# Patient Record
Sex: Female | Born: 1950
Health system: Southern US, Community
[De-identification: ages and names within clinical notes are randomized; demographics above are authoritative.]

## PROBLEM LIST (undated history)

## (undated) DIAGNOSIS — F32A Depression, unspecified: Secondary | ICD-10-CM

## (undated) DIAGNOSIS — F329 Major depressive disorder, single episode, unspecified: Secondary | ICD-10-CM

## (undated) DIAGNOSIS — R112 Nausea with vomiting, unspecified: Secondary | ICD-10-CM

## (undated) DIAGNOSIS — M2550 Pain in unspecified joint: Secondary | ICD-10-CM

## (undated) DIAGNOSIS — Z8489 Family history of other specified conditions: Secondary | ICD-10-CM

## (undated) DIAGNOSIS — G43909 Migraine, unspecified, not intractable, without status migrainosus: Secondary | ICD-10-CM

## (undated) DIAGNOSIS — D472 Monoclonal gammopathy: Secondary | ICD-10-CM

## (undated) DIAGNOSIS — D490 Neoplasm of unspecified behavior of digestive system: Secondary | ICD-10-CM

## (undated) DIAGNOSIS — R7303 Prediabetes: Secondary | ICD-10-CM

## (undated) DIAGNOSIS — A630 Anogenital (venereal) warts: Secondary | ICD-10-CM

## (undated) DIAGNOSIS — F419 Anxiety disorder, unspecified: Secondary | ICD-10-CM

## (undated) DIAGNOSIS — H6990 Unspecified Eustachian tube disorder, unspecified ear: Secondary | ICD-10-CM

## (undated) DIAGNOSIS — M1990 Unspecified osteoarthritis, unspecified site: Secondary | ICD-10-CM

## (undated) DIAGNOSIS — K219 Gastro-esophageal reflux disease without esophagitis: Secondary | ICD-10-CM

## (undated) DIAGNOSIS — R569 Unspecified convulsions: Secondary | ICD-10-CM

## (undated) DIAGNOSIS — Z9889 Other specified postprocedural states: Secondary | ICD-10-CM

## (undated) DIAGNOSIS — H6980 Other specified disorders of Eustachian tube, unspecified ear: Secondary | ICD-10-CM

## (undated) DIAGNOSIS — E785 Hyperlipidemia, unspecified: Secondary | ICD-10-CM

## (undated) DIAGNOSIS — H8109 Meniere's disease, unspecified ear: Secondary | ICD-10-CM

## (undated) DIAGNOSIS — G2581 Restless legs syndrome: Secondary | ICD-10-CM

## (undated) DIAGNOSIS — T7840XA Allergy, unspecified, initial encounter: Secondary | ICD-10-CM

## (undated) HISTORY — DX: Anogenital (venereal) warts: A63.0

## (undated) HISTORY — DX: Neoplasm of unspecified behavior of digestive system: D49.0

## (undated) HISTORY — PX: GANGLION CYST EXCISION: SHX1691

## (undated) HISTORY — DX: Gastro-esophageal reflux disease without esophagitis: K21.9

## (undated) HISTORY — DX: Migraine, unspecified, not intractable, without status migrainosus: G43.909

## (undated) HISTORY — DX: Unspecified convulsions: R56.9

## (undated) HISTORY — DX: Allergy, unspecified, initial encounter: T78.40XA

## (undated) HISTORY — DX: Depression, unspecified: F32.A

## (undated) HISTORY — DX: Major depressive disorder, single episode, unspecified: F32.9

## (undated) HISTORY — PX: OTHER SURGICAL HISTORY: SHX169

## (undated) HISTORY — PX: FINGER SURGERY: SHX640

## (undated) HISTORY — PX: ABDOMINAL HYSTERECTOMY: SHX81

## (undated) HISTORY — DX: Unspecified osteoarthritis, unspecified site: M19.90

---
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
## 1984-09-05 HISTORY — PX: PAROTID GLAND TUMOR EXCISION: SHX5221

## 2004-04-05 LAB — HM COLONOSCOPY: HM COLON: NORMAL

## 2004-07-08 ENCOUNTER — Ambulatory Visit: Payer: Self-pay | Admitting: Unknown Physician Specialty

## 2004-07-23 ENCOUNTER — Encounter: Payer: Self-pay | Admitting: Unknown Physician Specialty

## 2004-08-05 ENCOUNTER — Encounter: Payer: Self-pay | Admitting: Unknown Physician Specialty

## 2004-12-02 ENCOUNTER — Ambulatory Visit: Payer: Self-pay | Admitting: Unknown Physician Specialty

## 2004-12-07 ENCOUNTER — Ambulatory Visit: Payer: Self-pay | Admitting: Unknown Physician Specialty

## 2005-06-13 ENCOUNTER — Ambulatory Visit: Payer: Self-pay | Admitting: Unknown Physician Specialty

## 2005-07-14 ENCOUNTER — Ambulatory Visit: Payer: Self-pay | Admitting: Unknown Physician Specialty

## 2006-08-10 ENCOUNTER — Ambulatory Visit: Payer: Self-pay | Admitting: Unknown Physician Specialty

## 2006-09-05 HISTORY — PX: CHOLECYSTECTOMY: SHX55

## 2007-05-02 ENCOUNTER — Ambulatory Visit: Payer: Self-pay

## 2007-07-22 ENCOUNTER — Other Ambulatory Visit: Payer: Self-pay

## 2007-07-22 ENCOUNTER — Inpatient Hospital Stay: Payer: Self-pay | Admitting: Vascular Surgery

## 2008-09-05 HISTORY — PX: TUBAL LIGATION: SHX77

## 2009-09-02 ENCOUNTER — Ambulatory Visit: Payer: Self-pay | Admitting: Unknown Physician Specialty

## 2010-03-09 ENCOUNTER — Ambulatory Visit: Payer: Self-pay | Admitting: Orthopedic Surgery

## 2010-03-16 LAB — PATHOLOGY REPORT

## 2010-07-21 ENCOUNTER — Ambulatory Visit: Payer: Self-pay | Admitting: Otolaryngology

## 2011-03-07 ENCOUNTER — Ambulatory Visit: Payer: Self-pay | Admitting: Orthopedic Surgery

## 2011-03-10 ENCOUNTER — Ambulatory Visit: Payer: Self-pay | Admitting: Orthopedic Surgery

## 2011-09-06 HISTORY — PX: FINGER SURGERY: SHX640

## 2011-09-09 LAB — HM MAMMOGRAPHY: HM MAMMO: NORMAL

## 2011-10-14 ENCOUNTER — Ambulatory Visit: Payer: Self-pay | Admitting: Otolaryngology

## 2011-11-21 ENCOUNTER — Emergency Department: Payer: Self-pay | Admitting: Emergency Medicine

## 2012-01-17 ENCOUNTER — Ambulatory Visit: Payer: Self-pay | Admitting: Orthopedic Surgery

## 2012-05-23 ENCOUNTER — Ambulatory Visit: Payer: Self-pay | Admitting: Otolaryngology

## 2012-05-29 IMAGING — CR CERVICAL SPINE - COMPLETE 4+ VIEW
1 series · 7 of 7 positions shown · non-contrast
Comparison: none

REASON FOR EXAM: pain following trauma
COMMENTS:

[Series 1: w cervical spine lat · 0.14mm/px · 7 of 7 slices shown]
[im 1/7]
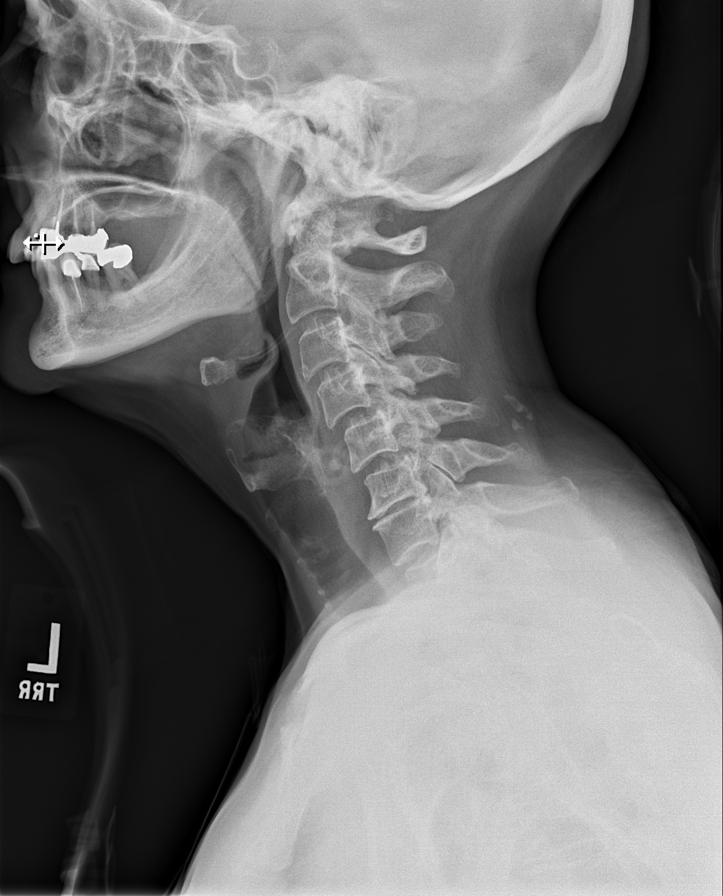
[im 2/7]
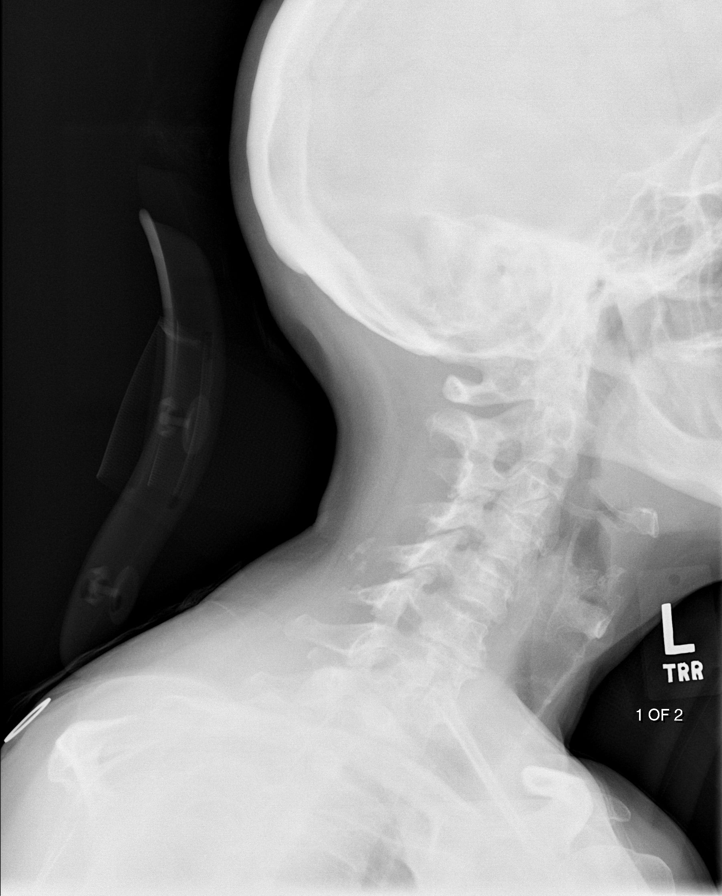
[im 3/7]
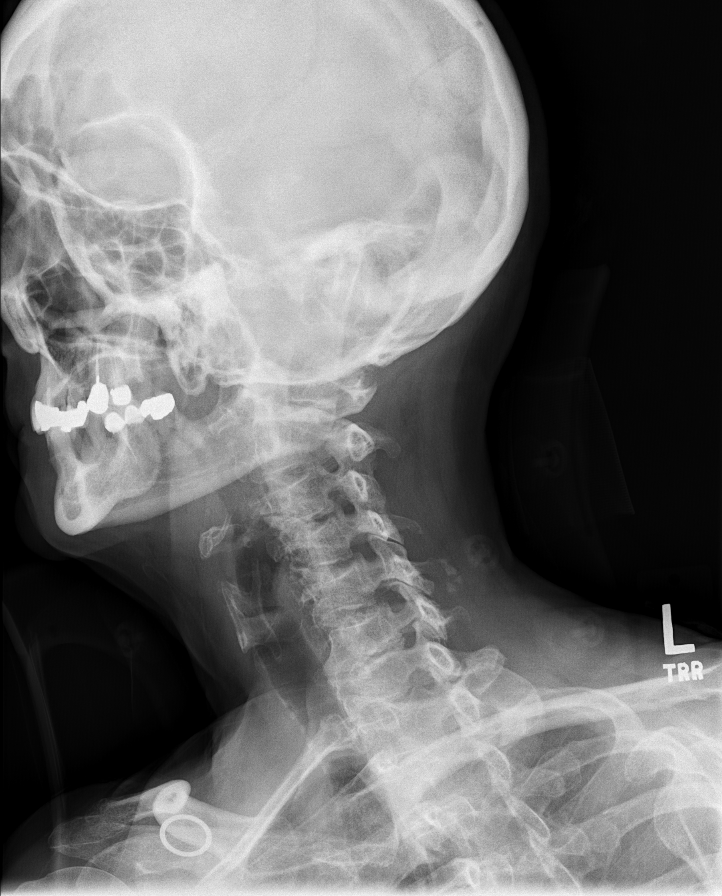
[im 4/7]
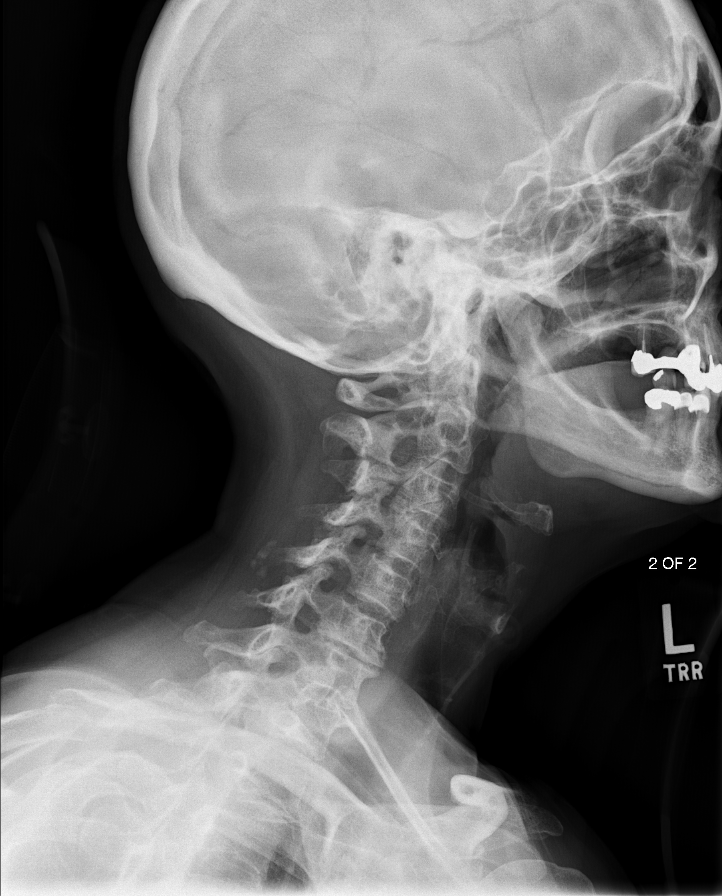
[im 5/7]
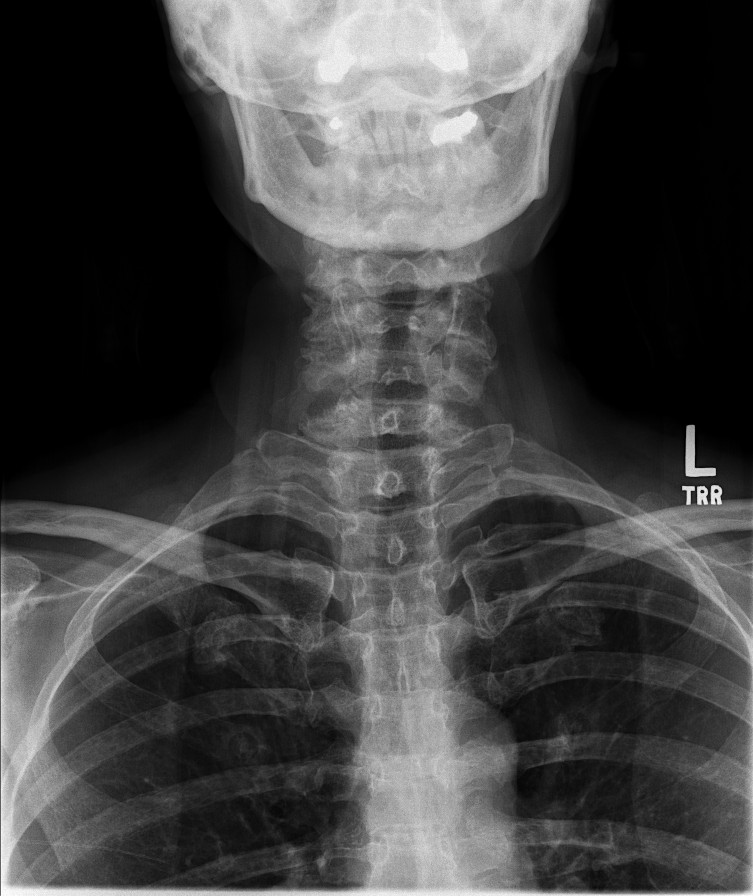
[im 6/7]
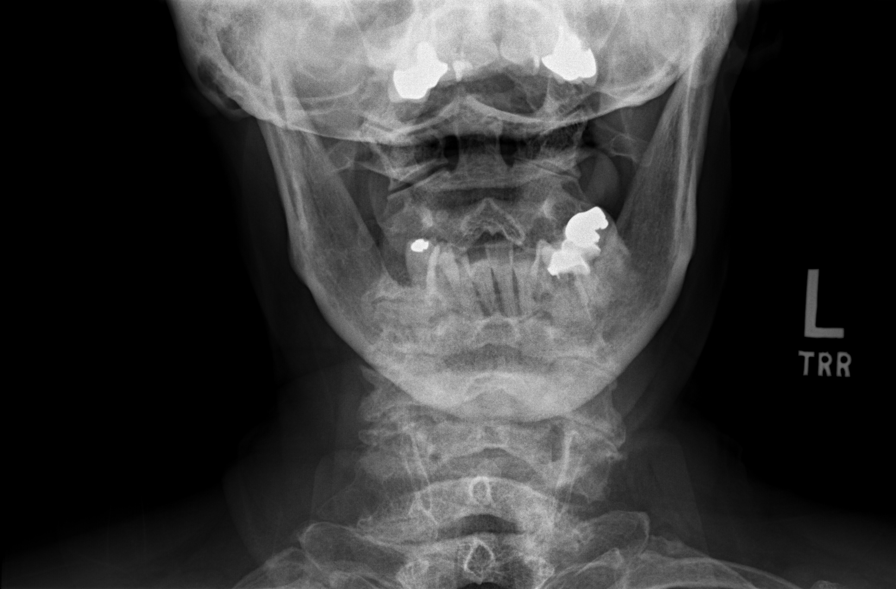
[im 7/7]
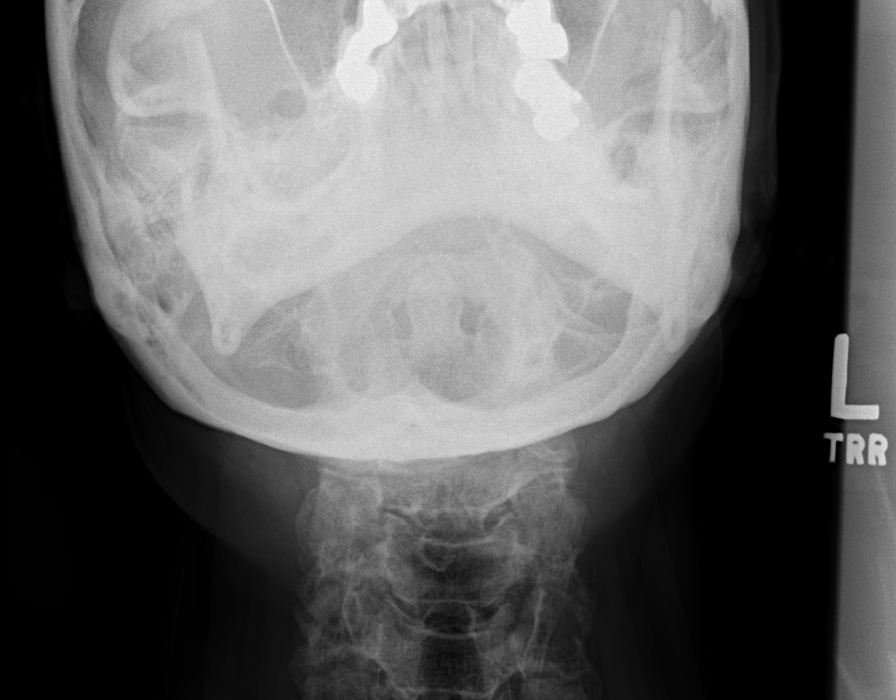

[7 of 7 positions shown; findings below may reference images not displayed]

PROCEDURE:     DXR - DXR CERVICAL SPINE COMPLETE  - November 21, 2011  [DATE]

RESULT:     Cervical spine intervertebral disc space narrowing is seen at
C6-C7. Minimal anterolisthesis is seen at the C4-C5 level. The prevertebral
soft tissues are normal. No fracture is evident. Some bony encroachment on
the facets on the right is present at the C3-C4 and C4-C5 levels with less
bony encroachment on the left at these levels. The head and neck are tilted
toward the left inferiorly. The odontoid appears intact. The atlantoaxial
alignment appears maintained.
IMPRESSION: 1.     Diffuse degenerative changes.
2.     No acute abnormality evident.
3.     Some soft tissue calcification is seen posterior to the spinous
processes predominantly at the C5 level.

## 2012-07-10 ENCOUNTER — Ambulatory Visit: Payer: Self-pay | Admitting: Orthopedic Surgery

## 2012-09-10 LAB — HM PAP SMEAR: HM PAP: NORMAL

## 2012-09-10 LAB — HM COLONOSCOPY: HM Colonoscopy: NORMAL

## 2014-12-23 NOTE — Op Note (Signed)
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   PROCEDURES:  1. Inject left thumb CMC joint. 2. Arthrodesis, right middle finger DIP joint.   SURGEON: Laurene Footman, MD  ANESTHESIA: General.    DESCRIPTION OF PROCEDURE: Patient was brought to the Operating Room and after adequate anesthesia was obtained, appropriate patient identification and timeout procedures were carried out, the skin was prepped with Betadine on the left hand and a 25-gauge needle inserted. 20 mg of Kenalog and 1 mL Marcaine were infiltrated into the [facility] joint without difficulty and a Band-Aid applied. Next, the right arm was prepped and draped in usual sterile fashion with tourniquet applied to the upper arm. The tourniquet was raised at the start of the case and a T-type incision was made over the DIP joint with the skin flaps elevated. The extensor tendon was divided and the joint exposed. Osteophytes removed with use of a small rongeur and a small saw used to debride the end of the middle phalanx. A K wire was then inserted out through the tip of the phalanx and then back into the middle phalanx. Based on this measurements were made and hand drill used. A 24 mm Acutrak screw was then inserted across the DIP joint. The head of the screw sunk well into the distal phalanx so it would not be prominent. There appeared to be very good compression at the arthrodesis site. The wound was irrigated and closed with simple interrupted 5-0 nylon. A sterile dressing with Xeroform, 4 x 4's and a Kling wrap around the finger were applied. Local anesthetic 10 mL of 0.5% Sensorcaine was infiltrated for a digital block to aid in postoperative analgesia.   COMPLICATIONS: There were no complications.    SPECIMEN: No specimens.   ESTIMATED BLOOD LOSS: Minimal.   TOURNIQUET TIME: 34 minutes at 250 mmHg.   ____________________________ Laurene Footman, MD mjm:cms D: 07/11/2012 07:19:20 ET T: 07/11/2012 09:44:13 ET JOB#: [id number]  cc: Laurene Footman, MD, <Dictator>  Laurene Footman MD ELECTRONICALLY SIGNED 07/11/2012 12:58

## 2014-12-28 NOTE — Op Note (Signed)
PATIENT NAME:  April Nguyen, April Nguyen MR#:  [id number] DATE OF BIRTH:  01/25/1951  DATE OF PROCEDURE:  01/17/2012  PREOPERATIVE DIAGNOSES:  1. Painful hardware left middle finger. 2. Left thumb CMC osteoarthritis.   POSTOPERATIVE DIAGNOSES:   1. Painful hardware left middle finger. 2. Left thumb CMC osteoarthritis.   PROCEDURES:  1. Removal screw, left middle finger. 2. Injection left thumb CMC joint.   ANESTHESIA: General.   SURGEON: Laurene Footman, MD  DESCRIPTION OF PROCEDURE: Patient was brought to the Operating Room and after adequate anesthesia was obtained, the arm was prepped and draped in the usual sterile fashion. After patient identification and timeout procedures were obtained a digital block was placed with a total of 10 mL of 0.5% Sensorcaine at the base of the third digit between the metacarpal heads. This was to sustain postoperative analgesia. A Penrose drain was used at the base of the finger and a small stab incision was made through the prior incision. C-arm was brought in and the screw head was identified easily. The AccuMed screw driver was then inserted and the screw came out without difficulty. Under fluoroscopic stress views the fusion was solid. The wound was closed with single simple interrupted 5-0 nylon skin suture. Next, going to the base of the thumb with the mini C-arm being used to help localize localization, a 25-gauge needle was inserted into the [facility] joint of the thumb and 1 mL Sensorcaine 0.5% plain and 20 mg of Kenalog were injected into this joint. Following this a bandage with Xeroform, 2 x 2's and a finger roll was applied. Patient was sent to recovery in stable condition.   ESTIMATED BLOOD LOSS: 25 mL through the tip of the finger.   COMPLICATIONS: None.   SPECIMEN: None. Hardware sent with patient.   ____________________________ Laurene Footman, MD mjm:cms D: 01/17/2012 21:04:57 ET T: 01/18/2012 07:40:59 ET JOB#: [id number]  cc: Laurene Footman, MD,  <Dictator> Laurene Footman MD ELECTRONICALLY SIGNED 01/18/2012 7:59

## 2015-04-09 ENCOUNTER — Encounter: Payer: Self-pay | Admitting: Nurse Practitioner

## 2015-04-09 ENCOUNTER — Ambulatory Visit (INDEPENDENT_AMBULATORY_CARE_PROVIDER_SITE_OTHER): Payer: 59 | Admitting: Nurse Practitioner

## 2015-04-09 VITALS — BP 112/68 | HR 92 | Temp 98.2°F | Resp 16 | Ht 65.0 in | Wt 161.8 lb

## 2015-04-09 DIAGNOSIS — F418 Other specified anxiety disorders: Secondary | ICD-10-CM

## 2015-04-09 DIAGNOSIS — H9202 Otalgia, left ear: Secondary | ICD-10-CM

## 2015-04-09 DIAGNOSIS — M7989 Other specified soft tissue disorders: Secondary | ICD-10-CM | POA: Diagnosis not present

## 2015-04-09 DIAGNOSIS — Z87898 Personal history of other specified conditions: Secondary | ICD-10-CM

## 2015-04-09 DIAGNOSIS — Z8669 Personal history of other diseases of the nervous system and sense organs: Secondary | ICD-10-CM | POA: Diagnosis not present

## 2015-04-09 DIAGNOSIS — M1990 Unspecified osteoarthritis, unspecified site: Secondary | ICD-10-CM

## 2015-04-09 DIAGNOSIS — H6983 Other specified disorders of Eustachian tube, bilateral: Secondary | ICD-10-CM

## 2015-04-09 DIAGNOSIS — Z8619 Personal history of other infectious and parasitic diseases: Secondary | ICD-10-CM

## 2015-04-09 DIAGNOSIS — H6993 Unspecified Eustachian tube disorder, bilateral: Secondary | ICD-10-CM

## 2015-04-09 DIAGNOSIS — K219 Gastro-esophageal reflux disease without esophagitis: Secondary | ICD-10-CM

## 2015-04-09 MED ORDER — ALPRAZOLAM 1 MG PO TABS: 1.0000 mg | ORAL_TABLET | Freq: Every day | ORAL | Status: DC

## 2015-04-09 MED ORDER — BUPROPION HCL ER (XL) 150 MG PO TB24: 150.0000 mg | ORAL_TABLET | Freq: Every day | ORAL | Status: DC

## 2015-04-09 NOTE — Progress Notes (Signed)
Pre visit review using our clinic review tool, if applicable. No additional management support is needed unless otherwise documented below in the visit note. 

## 2015-04-09 NOTE — Progress Notes (Deleted)
   Subjective:    Patient ID: April Nguyen, female    DOB: 1950-10-20, 64 y.o.   MRN: [id number]  HPI    Review of Systems     Objective:   Physical Exam        Assessment & Plan:
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
## 2015-04-09 NOTE — Patient Instructions (Addendum)
Welcome to [facility]! Nice to meet you!   Call ENT for instructions about ear cleaning with tubes.   Wellbutrin 150 mg once daily in the morning (start when you take only 1 of the 10 mg Lexapro tablets at night) After 7 days you will stop the Lexapro and keep the Wellbutrin 150 mg daily.   Contact your pharmacy to find out where you can get your shingles vaccination.

## 2015-04-09 NOTE — Progress Notes (Deleted)
   Subjective:    Patient ID: April Nguyen, female    DOB: November 17, 1950, 65 y.o.   MRN: [id number]  HPI     Review of Systems     Objective:   Physical Exam        Assessment & Plan:

## 2015-04-16 ENCOUNTER — Other Ambulatory Visit: Payer: Self-pay | Admitting: *Deleted

## 2015-04-16 DIAGNOSIS — Z8619 Personal history of other infectious and parasitic diseases: Secondary | ICD-10-CM | POA: Insufficient documentation

## 2015-04-16 DIAGNOSIS — M7989 Other specified soft tissue disorders: Secondary | ICD-10-CM | POA: Insufficient documentation

## 2015-04-16 DIAGNOSIS — H6980 Other specified disorders of Eustachian tube, unspecified ear: Secondary | ICD-10-CM | POA: Insufficient documentation

## 2015-04-16 DIAGNOSIS — M1990 Unspecified osteoarthritis, unspecified site: Secondary | ICD-10-CM | POA: Insufficient documentation

## 2015-04-16 DIAGNOSIS — F418 Other specified anxiety disorders: Secondary | ICD-10-CM | POA: Insufficient documentation

## 2015-04-16 DIAGNOSIS — H9202 Otalgia, left ear: Secondary | ICD-10-CM | POA: Insufficient documentation

## 2015-04-16 DIAGNOSIS — K219 Gastro-esophageal reflux disease without esophagitis: Secondary | ICD-10-CM | POA: Insufficient documentation

## 2015-04-16 DIAGNOSIS — Z87898 Personal history of other specified conditions: Secondary | ICD-10-CM | POA: Insufficient documentation

## 2015-04-16 HISTORY — DX: Personal history of other infectious and parasitic diseases: Z86.19

## 2015-04-16 NOTE — Assessment & Plan Note (Signed)
Stable. Pt knows about her triggers (stress and foods) and takes Prilosec prn.

## 2015-04-16 NOTE — Assessment & Plan Note (Signed)
March this year. Resolved. Pt reports no PHN

## 2015-04-16 NOTE — Assessment & Plan Note (Signed)
No seizures in recent years. Will follow.

## 2015-04-16 NOTE — Telephone Encounter (Signed)
Okay to refill? Please advise (not on med list)

## 2015-04-16 NOTE — Assessment & Plan Note (Signed)
No significant findings on exam- Asked pt to drink lots of water, elevate feet when possible, wear compression hose (pt has). Will follow

## 2015-04-16 NOTE — Assessment & Plan Note (Signed)
Stable on Mobic currently. Pt reports bone fusion of finger in past and right knee surgery. Will follow.

## 2015-04-16 NOTE — Assessment & Plan Note (Signed)
Cerumen around the tube placed in the TM. Stable, but uncomfortable, will send to ENT for help with removal.

## 2015-04-16 NOTE — Assessment & Plan Note (Signed)
Pt has a long hx of ear concerns. She currently has a tube in the left ear and I advised pt to see ENT for removal of cerumen.

## 2015-04-16 NOTE — Assessment & Plan Note (Signed)
Pt does not like Lexapro. She would like to try something else. Will cross taper from Lexapro to Wellbutrin. Instructions on AVS and discussed w/ pt. Pt also will continue Xanax- CSC signed and [place]CC[place] checked for compliance.

## 2015-04-17 MED ORDER — AZELASTINE-FLUTICASONE 137-50 MCG/ACT NA SUSP: 1.0000 | Freq: Two times a day (BID) | NASAL | Status: DC

## 2015-05-08 ENCOUNTER — Ambulatory Visit: Payer: 59 | Admitting: Nurse Practitioner

## 2015-05-08 ENCOUNTER — Encounter: Payer: Self-pay | Admitting: *Deleted

## 2015-05-12 NOTE — Discharge Instructions (Signed)
[facility] DISCHARGE INSTRUCTIONS FOR MYRINGOTOMY AND TUBE INSERTION  [facility] Margaretha Sheffield, M.D. Roena Malady, M.D. Malon Kindle, M.D. Carloyn Manner, M.D.  Diet:   After surgery, the patient should take only liquids and foods as tolerated.  The patient may then have a regular diet after the effects of anesthesia have worn off, usually about four to six hours after surgery.  Activities:   The patient should rest until the effects of anesthesia have worn off.  After this, there are no restrictions on the normal daily activities.  Medications:   You will be given antibiotic drops to be used in the ears postoperatively.  It is recommended to use _4__ drops ___2___ times a day for __4_ days, then the drops should be saved for possible future use.  The tubes should not cause any discomfort to the patient, but if there is any question, Tylenol should be given according to the instructions for the age of the patient.  Other medications should be continued normally.  Precautions:   Should there be recurrent drainage after the tubes are placed, the drops should be used for approximately _3-4___ days.  If it does not clear, you should call the ENT office.  Earplugs:   Earplugs are only needed for those who are going to be submerged under water.  When taking a bath or shower and using a cup or showerhead to rinse hair, it is not necessary to wear earplugs.  These come in a variety of fashions, all of which can be obtained at our office.  However, if one is not able to come by the office, then silicone plugs can be found at most pharmacies.  It is not advised to stick anything in the ear that is not approved as an earplug.  Silly putty is not to be used as an earplug.  Swimming is allowed in patients after ear tubes are inserted, however, they must wear earplugs if they are going to be submerged under water.  For those children who are going to be swimming a  lot, it is recommended to use a fitted ear mold, which can be made by our audiologist.  If discharge is noticed from the ears, this most likely represents an ear infection.  We would recommend getting your eardrops and using them as indicated above.  If it does not clear, then you should call the ENT office.  For follow up, the patient should return to the ENT office three weeks postoperatively and then every six months as required by the doctor.   General Anesthesia, Care After Refer to this sheet in the next few weeks. These instructions provide you with information on caring for yourself after your procedure. Your health care provider may also give you more specific instructions. Your treatment has been planned according to current medical practices, but problems sometimes occur. Call your health care provider if you have any problems or questions after your procedure. WHAT TO EXPECT AFTER THE PROCEDURE After the procedure, it is typical to experience:  Sleepiness.  Nausea and vomiting. HOME CARE INSTRUCTIONS  For the first 24 hours after general anesthesia:  Have a responsible person with you.  Do not drive a car. If you are alone, do not take public transportation.  Do not drink alcohol.  Do not take medicine that has not been prescribed by your health care provider.  Do not sign important papers or make important decisions.  You may resume a normal  diet and activities as directed by your health care provider.  Change bandages (dressings) as directed.  If you have questions or problems that seem related to general anesthesia, call the hospital and ask for the anesthetist or anesthesiologist on call. SEEK MEDICAL CARE IF:  You have nausea and vomiting that continue the day after anesthesia.  You develop a rash. SEEK IMMEDIATE MEDICAL CARE IF:   You have difficulty breathing.  You have chest pain.  You have any allergic problems. Document Released: 11/28/2000 Document  Revised: 08/27/2013 Document Reviewed: 03/07/2013 [facility] Patient Information 2015 [place], [place]. This information is not intended to replace advice given to you by your health care provider. Make sure you discuss any questions you have with your health care provider.

## 2015-05-13 ENCOUNTER — Ambulatory Visit: Payer: 59 | Admitting: Anesthesiology

## 2015-05-13 ENCOUNTER — Ambulatory Visit: Payer: 59 | Admitting: Nurse Practitioner

## 2015-05-13 ENCOUNTER — Encounter: Payer: Self-pay | Admitting: Anesthesiology

## 2015-05-13 ENCOUNTER — Encounter
Admission: RE | Disposition: A | Discharge status: Home / Self Care | Payer: Self-pay | Source: Ambulatory Visit | Attending: Otolaryngology

## 2015-05-13 ENCOUNTER — Ambulatory Visit
Admission: RE | Admit: 2015-05-13 | Discharge: 2015-05-13 | Disposition: A | Discharge status: Home / Self Care | Payer: 59 | Source: Ambulatory Visit | Attending: Otolaryngology | Admitting: Otolaryngology

## 2015-05-13 DIAGNOSIS — F419 Anxiety disorder, unspecified: Secondary | ICD-10-CM | POA: Insufficient documentation

## 2015-05-13 DIAGNOSIS — H8103 Meniere's disease, bilateral: Secondary | ICD-10-CM | POA: Insufficient documentation

## 2015-05-13 DIAGNOSIS — K219 Gastro-esophageal reflux disease without esophagitis: Secondary | ICD-10-CM | POA: Insufficient documentation

## 2015-05-13 DIAGNOSIS — Z4582 Encounter for adjustment or removal of myringotomy device (stent) (tube): Secondary | ICD-10-CM | POA: Diagnosis not present

## 2015-05-13 DIAGNOSIS — Z888 Allergy status to other drugs, medicaments and biological substances status: Secondary | ICD-10-CM | POA: Insufficient documentation

## 2015-05-13 DIAGNOSIS — H7293 Unspecified perforation of tympanic membrane, bilateral: Secondary | ICD-10-CM | POA: Diagnosis not present

## 2015-05-13 DIAGNOSIS — H6983 Other specified disorders of Eustachian tube, bilateral: Secondary | ICD-10-CM | POA: Insufficient documentation

## 2015-05-13 DIAGNOSIS — M1990 Unspecified osteoarthritis, unspecified site: Secondary | ICD-10-CM | POA: Diagnosis not present

## 2015-05-13 DIAGNOSIS — R569 Unspecified convulsions: Secondary | ICD-10-CM | POA: Insufficient documentation

## 2015-05-13 DIAGNOSIS — H6123 Impacted cerumen, bilateral: Secondary | ICD-10-CM | POA: Diagnosis not present

## 2015-05-13 HISTORY — DX: Nausea with vomiting, unspecified: R11.2

## 2015-05-13 HISTORY — DX: Family history of other specified conditions: Z84.89

## 2015-05-13 HISTORY — DX: Meniere's disease, unspecified ear: H81.09

## 2015-05-13 HISTORY — DX: Other specified postprocedural states: Z98.890

## 2015-05-13 HISTORY — PX: MYRINGOTOMY WITH TUBE PLACEMENT: SHX5663

## 2015-05-13 SURGERY — MYRINGOTOMY WITH TUBE PLACEMENT
Anesthesia: General | Laterality: Bilateral | Wound class: Clean Contaminated

## 2015-05-13 MED ORDER — LACTATED RINGERS IV SOLN
INTRAVENOUS | Status: DC
  Administered 2015-05-13: 09:00:00 via INTRAVENOUS

## 2015-05-13 MED ORDER — SCOPOLAMINE 1 MG/3DAYS TD PT72
1.0000 | MEDICATED_PATCH | TRANSDERMAL | Status: DC
  Administered 2015-05-13: 1.5 mg via TRANSDERMAL

## 2015-05-13 MED ORDER — FENTANYL CITRATE (PF) 100 MCG/2ML IJ SOLN
50.0000 ug | INTRAMUSCULAR | Status: DC | PRN
  Administered 2015-05-13: 50 ug via INTRAVENOUS

## 2015-05-13 MED ORDER — CIPROFLOXACIN-DEXAMETHASONE 0.3-0.1 % OT SUSP: 4.0000 [drp] | Freq: Two times a day (BID) | OTIC | Status: DC

## 2015-05-13 MED ORDER — GLYCOPYRROLATE 0.2 MG/ML IJ SOLN
INTRAMUSCULAR | Status: DC | PRN
  Administered 2015-05-13: 0.1 mg via INTRAVENOUS

## 2015-05-13 MED ORDER — OXYCODONE HCL 5 MG/5ML PO SOLN
5.0000 mg | Freq: Once | ORAL | Status: AC | PRN
  Administered 2015-05-13: 5 mg via ORAL

## 2015-05-13 MED ORDER — CIPROFLOXACIN-DEXAMETHASONE 0.3-0.1 % OT SUSP
OTIC | Status: DC | PRN
  Administered 2015-05-13: 4 [drp] via OTIC

## 2015-05-13 MED ORDER — PROPOFOL 10 MG/ML IV BOLUS
INTRAVENOUS | Status: DC | PRN
  Administered 2015-05-13: 100 mg via INTRAVENOUS

## 2015-05-13 MED ORDER — LIDOCAINE HCL (CARDIAC) 20 MG/ML IV SOLN
INTRAVENOUS | Status: DC | PRN
  Administered 2015-05-13: 50 mg via INTRAVENOUS

## 2015-05-13 MED ORDER — OXYCODONE HCL 5 MG PO TABS: 5.0000 mg | ORAL_TABLET | Freq: Once | ORAL | Status: AC | PRN

## 2015-05-13 MED ORDER — ONDANSETRON HCL 4 MG/2ML IJ SOLN
INTRAMUSCULAR | Status: DC | PRN
  Administered 2015-05-13: 4 mg via INTRAVENOUS

## 2015-05-13 MED ORDER — LACTATED RINGERS IV SOLN
INTRAVENOUS | Status: DC
  Administered 2015-05-13: 08:00:00 via INTRAVENOUS

## 2015-05-13 MED ORDER — MIDAZOLAM HCL 5 MG/5ML IJ SOLN
INTRAMUSCULAR | Status: DC | PRN
  Administered 2015-05-13: 2 mg via INTRAVENOUS

## 2015-05-13 SURGICAL SUPPLY — 8 items
BLADE MYR LANCE NRW W/HDL (BLADE) IMPLANT
CANISTER SUCT 1200ML W/VALVE (MISCELLANEOUS) ×2 IMPLANT
COTTONBALL LRG STERILE PKG (GAUZE/BANDAGES/DRESSINGS) IMPLANT
GLOVE BIO SURGEON STRL SZ7.5 (GLOVE) ×2 IMPLANT
STRAP BODY AND KNEE 60X3 (MISCELLANEOUS) ×2 IMPLANT
TOWEL OR 17X26 4PK STRL BLUE (TOWEL DISPOSABLE) ×2 IMPLANT
TUBING CONN 6MMX3.1M (TUBING) ×1
TUBING SUCTION CONN 0.25 STRL (TUBING) ×1 IMPLANT

## 2015-05-13 NOTE — H&P (Signed)
..  History and Physical paper copy reviewed and updated date of procedure and will be scanned into system.  

## 2015-05-13 NOTE — Addendum Note (Signed)
Addendum  created 05/13/15 0370 by Fidel Levy, MD   Modules edited: Orders

## 2015-05-13 NOTE — Transfer of Care (Signed)
Immediate Anesthesia Transfer of Care Note  Patient: April Nguyen  Procedure(s) Performed: Procedure(s): MYRINGOTOMY WITH  BUTTERFLY TUBE PLACEMENT (Bilateral)  Patient Location: PACU  Anesthesia Type: General  Level of Consciousness: awake, alert  and patient cooperative  Airway and Oxygen Therapy: Patient Spontanous Breathing and Patient connected to supplemental oxygen  Post-op Assessment: Post-op Vital signs reviewed, Patient's Cardiovascular Status Stable, Respiratory Function Stable, Patent Airway and No signs of Nausea or vomiting  Post-op Vital Signs: Reviewed and stable  Complications: No apparent anesthesia complications

## 2015-05-13 NOTE — Op Note (Signed)
..  05/13/2015  9:12 AM    [name]  [id number]   Pre-Op Dx:  Rowe Clack, menieres  Post-op Dx: Thom Chimes  Proc:Bilateral myringotomy with Butterfly tubes  Surg: Lashun Ramseyer  Anes:  General by mask  EBL:  None  Comp:  None  Findings:  Significant cerumen impaction around butterfly tubes bilaterally, left greater than right.  Tubes removed and replaced with new butterfly tubes.  Right TM posterior/inferior TM perforation greater than diameter of tube, left TM perforation diameter or tube.  Procedure: With the patient in a comfortable supine position, general mask anesthesia was administered.  At an appropriate level, microscope and speculum were used to examine and clean the RIGHT ear canal.  The findings were as described above. A significant circumferential cerumen impaction around the base of her butterfly tube was noted.  This was extending into the middle ear on the tube.  Due to this, the entire tube was removed with alligator forceps.  This demonstrated a posterior/inferior TM perforation that was twice the diameter of the butterfly tube.  A new butterfly tube was placed through the perforation without complications.  Ciprodex otic solution was instilled into the external canal, and insufflated into the middle ear.  A cotton ball was placed at the external meatus. Hemostasis was observed.  This side was completed.  After completing the RIGHT side, the LEFT side was done in identical fashion.  This again demonstrated a significant amount of wax adherent to the butterfly tube.  The entire tube and wax conglomeration was removed with alligator forceps.  A new Butterfly tube was placed through the TM perforation.  Ciprodex drops were instilled.  Following this  The patient was returned to anesthesia, awakened, and transferred to recovery in stable condition.  Dispo:  PACU to home  Plan: Routine drop use and water precautions.  Recheck my office three  weeks.   Admir Candelas 9:12 AM 05/13/2015

## 2015-05-13 NOTE — Anesthesia Preprocedure Evaluation (Signed)
Anesthesia Evaluation  Patient identified by MRN, date of birth, ID band  Reviewed: NPO status   History of Anesthesia Complications (+) PONV and history of anesthetic complications  Airway Mallampati: II  TM Distance: >3 FB Neck ROM: full    Dental no notable dental hx.    Pulmonary neg pulmonary ROS,    Pulmonary exam normal        Cardiovascular Exercise Tolerance: Good negative cardio ROS Normal cardiovascular exam     Neuro/Psych  Headaches, Seizures - (last 3 years ago),  Anxiety menieres dz negative psych ROS   GI/Hepatic Neg liver ROS, GERD  Controlled,  Endo/Other  negative endocrine ROS  Renal/GU negative Renal ROS  negative genitourinary   Musculoskeletal  (+) Arthritis ,   Abdominal   Peds  Hematology negative hematology ROS (+)   Anesthesia Other Findings   Reproductive/Obstetrics                             Anesthesia Physical Anesthesia Plan  ASA: II  Anesthesia Plan: General   Post-op Pain Management:    Induction:   Airway Management Planned:   Additional Equipment:   Intra-op Plan:   Post-operative Plan:   Informed Consent: I have reviewed the patients History and Physical, chart, labs and discussed the procedure including the risks, benefits and alternatives for the proposed anesthesia with the patient or authorized representative who has indicated his/her understanding and acceptance.     Plan Discussed with: CRNA  Anesthesia Plan Comments:         Anesthesia Quick Evaluation

## 2015-05-13 NOTE — Anesthesia Postprocedure Evaluation (Signed)
  Anesthesia Post-op Note  Patient: April Nguyen  Procedure(s) Performed: Procedure(s): MYRINGOTOMY WITH  BUTTERFLY TUBE PLACEMENT (Bilateral)  Anesthesia type:General  Patient location: PACU  Post pain: Pain level controlled  Post assessment: Post-op Vital signs reviewed, Patient's Cardiovascular Status Stable, Respiratory Function Stable, Patent Airway and No signs of Nausea or vomiting  Post vital signs: Reviewed and stable  Last Vitals:  Filed Vitals:   05/13/15 0924  BP:   Pulse: 92  Temp:   Resp: 16    Level of consciousness: awake, alert  and patient cooperative  Complications: No apparent anesthesia complications

## 2015-05-13 NOTE — Anesthesia Procedure Notes (Signed)
Performed by: Jarron Curley Pre-anesthesia Checklist: Patient identified, Emergency Drugs available, Suction available, Timeout performed and Patient being monitored Patient Re-evaluated:Patient Re-evaluated prior to inductionOxygen Delivery Method: Circle system utilized Preoxygenation: Pre-oxygenation with 100% oxygen Intubation Type: Inhalational induction Ventilation: Mask ventilation without difficulty and Mask ventilation throughout procedure Dental Injury: Teeth and Oropharynx as per pre-operative assessment        

## 2015-05-14 ENCOUNTER — Encounter: Payer: Self-pay | Admitting: Otolaryngology

## 2015-05-19 ENCOUNTER — Ambulatory Visit (INDEPENDENT_AMBULATORY_CARE_PROVIDER_SITE_OTHER): Payer: 59 | Admitting: Nurse Practitioner

## 2015-05-19 VITALS — BP 98/78 | HR 86 | Temp 98.2°F | Resp 14 | Ht 65.0 in | Wt 160.2 lb

## 2015-05-19 DIAGNOSIS — F418 Other specified anxiety disorders: Secondary | ICD-10-CM

## 2015-05-19 MED ORDER — BUPROPION HCL ER (XL) 150 MG PO TB24: 150.0000 mg | ORAL_TABLET | Freq: Every day | ORAL | Status: DC

## 2015-05-19 NOTE — Progress Notes (Signed)
Patient ID: April Nguyen, female    DOB: 04-16-1951  Age: 64 y.o. MRN: [id number]  CC: Follow-up   HPI [name] presents for follow up of medication.   1) Pt was weaned from Lexapro and tapered to Wellbutrin 150 mg XL last month. Also on Xanax.  Pt notices agitation and so does husband  Feels more focused   History April Nguyen has a past medical history of Depression; GERD (gastroesophageal reflux disease); Allergy; Genital warts; PONV (postoperative nausea and vomiting); Family history of adverse reaction to anesthesia; Seizures; Arthritis; Migraines; and Meniere's disease.   She has past surgical history that includes Cholecystectomy (2008); Tonsillectomy and adenoidectomy (1959); Abdominal hysterectomy; Tubal ligation (2010); dialation and cartarage (1982, 1999, 2000); Parotid gland tumor excision (1986); Ganglion cyst excision (2011, 2012); Finger surgery (2011, 2012); Finger surgery (2013); and Myringotomy with tube placement (Bilateral, 05/13/2015).   Her family history includes Arthritis in her mother; Diabetes in her mother; Heart disease in her father and mother; Hyperlipidemia in her father and mother; Hypertension in her mother; Stroke in her mother.She reports that she has never smoked. She does not have any smokeless tobacco history on file. She reports that she does not drink alcohol or use illicit drugs.  Outpatient Prescriptions Prior to Visit  Medication Sig Dispense Refill  . ALPRAZolam (XANAX) 1 MG tablet Take 1 tablet (1 mg total) by mouth at bedtime. 30 tablet 3  . Azelastine-Fluticasone 137-50 MCG/ACT SUSP Place 1 spray into the nose 2 (two) times daily. 23 g 2  . cholecalciferol (VITAMIN D) 1000 UNITS tablet Take 1,000 Units by mouth daily.    . ciprofloxacin-dexamethasone (CIPRODEX) otic suspension Place 4 drops into both ears 2 (two) times daily. 7.5 mL 0  . estradiol (VIVELLE-DOT) 0.05 MG/24HR patch Place 1 patch onto the skin 2 (two) times a week.    . fluticasone  (FLONASE) 50 MCG/ACT nasal spray Place 2 sprays into the nose as needed.    . hydrochlorothiazide (HYDRODIURIL) 12.5 MG tablet Take 12.5 mg by mouth daily. For meniere's    . Melatonin 10 MG CAPS Take 1 capsule by mouth at bedtime.    . meloxicam (MOBIC) 7.5 MG tablet Take 7.5 mg by mouth as needed for pain.    Marland Kitchen omeprazole (PRILOSEC) 10 MG capsule Take 10 mg by mouth daily.    Marland Kitchen buPROPion (WELLBUTRIN XL) 150 MG 24 hr tablet Take 1 tablet (150 mg total) by mouth daily. 30 tablet 0   No facility-administered medications prior to visit.    ROS Review of Systems  Constitutional: Negative for fever, chills, diaphoresis and fatigue.  Respiratory: Negative for chest tightness, shortness of breath and wheezing.   Cardiovascular: Negative for chest pain, palpitations and leg swelling.  Gastrointestinal: Negative for nausea, vomiting and diarrhea.  Skin: Negative for rash.  Neurological: Negative for dizziness, weakness, numbness and headaches.  Psychiatric/Behavioral: The patient is nervous/anxious.     Objective:  BP 98/78 mmHg  Pulse 86  Temp(Src) 98.2 F (36.8 C)  Resp 14  Ht 5\' 5"  (1.651 m)  Wt 160 lb 3.2 oz (72.666 kg)  BMI 26.66 kg/m2  SpO2 95%  Physical Exam  Constitutional: She is oriented to person, place, and time. She appears well-developed and well-nourished. No distress.  HENT:  Head: Normocephalic and atraumatic.  Right Ear: External ear normal.  Left Ear: External ear normal.  Cardiovascular: Normal rate, regular rhythm and normal heart sounds.  Exam reveals no gallop and no friction rub.  No murmur heard. Pulmonary/Chest: Effort normal and breath sounds normal. No respiratory distress. She has no wheezes. She has no rales. She exhibits no tenderness.  Neurological: She is alert and oriented to person, place, and time. No cranial nerve deficit. She exhibits normal muscle tone. Coordination normal.  Skin: Skin is warm and dry. No rash noted. She is not diaphoretic.   Psychiatric: She has a normal mood and affect. Her behavior is normal. Judgment and thought content normal.    Assessment & Plan:   There are no diagnoses linked to this encounter. I am having Ms. Christina maintain her estradiol, fluticasone, omeprazole, cholecalciferol, meloxicam, Melatonin, ALPRAZolam, Azelastine-Fluticasone, hydrochlorothiazide, ciprofloxacin-dexamethasone, Ciprofloxacin (CIPRO PO), and buPROPion.  Meds ordered this encounter  Medications  . Ciprofloxacin (CIPRO PO)    Sig: Take by mouth.  Marland Kitchen buPROPion (WELLBUTRIN XL) 150 MG 24 hr tablet    Sig: Take 1 tablet (150 mg total) by mouth daily.    Dispense:  30 tablet    Refill:  2    Order Specific Question:  Supervising Provider    Answer:  Crecencio Mc [[id number]]     Follow-up: Return if symptoms worsen or fail to improve.

## 2015-05-19 NOTE — Progress Notes (Signed)
Pre visit review using our clinic review tool, if applicable. No additional management support is needed unless otherwise documented below in the visit note. 

## 2015-05-19 NOTE — Patient Instructions (Signed)
Work on cutting down on sugar and carbs (you can do replacements with sugar free- just watch carbs).   Fruit is good in small portions.   Follow up in 3 months.

## 2015-05-28 ENCOUNTER — Encounter: Payer: Self-pay | Admitting: Nurse Practitioner

## 2015-05-28 NOTE — Assessment & Plan Note (Signed)
Pt willing to stick with Wellbutrin. Will follow up in 3 months.

## 2015-07-12 ENCOUNTER — Encounter: Payer: Self-pay | Admitting: *Deleted

## 2015-07-12 ENCOUNTER — Ambulatory Visit
Admission: EM | Admit: 2015-07-12 | Discharge: 2015-07-12 | Disposition: A | Discharge status: Home / Self Care | Payer: 59 | Attending: Internal Medicine | Admitting: Internal Medicine

## 2015-07-12 DIAGNOSIS — L03113 Cellulitis of right upper limb: Secondary | ICD-10-CM | POA: Diagnosis not present

## 2015-07-12 MED ORDER — CLINDAMYCIN HCL 300 MG PO CAPS: 300.0000 mg | ORAL_CAPSULE | Freq: Three times a day (TID) | ORAL | Status: AC

## 2015-07-12 NOTE — Discharge Instructions (Signed)
Keep it clean and dry and covered Complete antibiotics  Cellulitis Cellulitis is an infection of the skin and the tissue beneath it. The infected area is usually red and tender. Cellulitis occurs most often in the arms and lower legs.  CAUSES  Cellulitis is caused by bacteria that enter the skin through cracks or cuts in the skin. The most common types of bacteria that cause cellulitis are staphylococci and streptococci. SIGNS AND SYMPTOMS   Redness and warmth.  Swelling.  Tenderness or pain.  Fever. DIAGNOSIS  Your health care provider can usually determine what is wrong based on a physical exam. Blood tests may also be done. TREATMENT  Treatment usually involves taking an antibiotic medicine. HOME CARE INSTRUCTIONS   Take your antibiotic medicine as directed by your health care provider. Finish the antibiotic even if you start to feel better.  Keep the infected arm or leg elevated to reduce swelling.  Apply a warm cloth to the affected area up to 4 times per day to relieve pain.  Take medicines only as directed by your health care provider.  Keep all follow-up visits as directed by your health care provider. SEEK MEDICAL CARE IF:   You notice red streaks coming from the infected area.  Your red area gets larger or turns dark in color.  Your bone or joint underneath the infected area becomes painful after the skin has healed.  Your infection returns in the same area or another area.  You notice a swollen bump in the infected area.  You develop new symptoms.  You have a fever. SEEK IMMEDIATE MEDICAL CARE IF:   You feel very sleepy.  You develop vomiting or diarrhea.  You have a general ill feeling (malaise) with muscle aches and pains.   This information is not intended to replace advice given to you by your health care provider. Make sure you discuss any questions you have with your health care provider.   Document Released: 06/01/2005 Document Revised:  05/13/2015 Document Reviewed: 11/07/2011 Elsevier Interactive Patient Education [employer].

## 2015-07-12 NOTE — ED Notes (Signed)
Patient noticed a red itchy bump on her right shoulder on Friday 07/10/15 afternoon and she developed another red bump on her left wrist Saturday morning. The red areas are very painful to the touch.

## 2015-07-12 NOTE — ED Provider Notes (Signed)
CSN: [id number]     Arrival date & time 07/12/15  0910 History   None    Chief Complaint  Patient presents with  . Insect Bite   HPI  April Nguyen is a pleasant 64 y.o. female who presents with what she believes to be "bug bites".  She noticed erythema & swelling on right shoulder 2 days ago just beneath her bra strap.  Then she noticed another itchy spot on her left hand.  Painful when scratched.  Pain is 0/10.  She has tried neosporin & benadryl cream & both helped some. No other rash, SOB, or dizziness.  Denies any new medications.  She has a dog.  She does not recall a specific insect bite or injury, but she was gardening 3 nights ago prior to the onset of the lesions.  Denies fever or chills.     Past Medical History  Diagnosis Date  . Depression   . GERD (gastroesophageal reflux disease)   . Allergy     Seasonal  . Genital warts     In past  . PONV (postoperative nausea and vomiting)     and headaches  . Family history of adverse reaction to anesthesia     brother - PONV  . Seizures ([place])     Dx 16 yrs ago - hormonal - none at least 3 yrs  . Arthritis     neck, hands, feet  . Migraines     sinus/stress  . Meniere's disease    Past Surgical History  Procedure Laterality Date  . Cholecystectomy  2008  . Tonsillectomy and adenoidectomy  1959  . Abdominal hysterectomy    . Tubal ligation  2010  . Dialation and [place], 1999, 2000  . Parotid gland tumor excision  1986  . Ganglion cyst excision  2011, 2012    Left and right hands  . Finger surgery  2011, 2012    Bone fusion of middle finger right and left hands  . Finger surgery  2013    Pin removed from left middle finger  . Myringotomy with tube placement Bilateral 05/13/2015    Procedure: MYRINGOTOMY WITH  BUTTERFLY TUBE PLACEMENT;  Surgeon: Carloyn Manner, MD;  Location: [facility];  Service: ENT;  Laterality: Bilateral;   Family History  Problem Relation Age of Onset  . Arthritis Mother   .  Hyperlipidemia Mother   . Hypertension Mother   . Stroke Mother   . Heart disease Mother   . Diabetes Mother   . Hyperlipidemia Father   . Heart disease Father    Social History  Substance Use Topics  . Smoking status: Never Smoker   . Smokeless tobacco: Never Used  . Alcohol Use: No   OB History    No data available     Review of Systems  Constitutional: Negative.   HENT: Negative.   Eyes: Negative.   Respiratory: Negative.   Cardiovascular: Negative.   Gastrointestinal: Negative.   Genitourinary: Negative.   Musculoskeletal: Negative.   Allergic/Immunologic: Negative.   Neurological: Negative.   Hematological: Negative.   Psychiatric/Behavioral: Negative.     Allergies  Septra  Home Medications   Prior to Admission medications   Medication Sig Start Date End Date Taking? Authorizing Provider  ALPRAZolam Duanne Moron) 1 MG tablet Take 1 tablet (1 mg total) by mouth at bedtime. 04/09/15  Yes Rubbie Battiest, NP  Azelastine-Fluticasone 137-50 MCG/ACT SUSP Place 1 spray into the nose 2 (two) times daily. 04/17/15  Yes Rubbie Battiest, NP  buPROPion (WELLBUTRIN XL) 150 MG 24 hr tablet Take 1 tablet (150 mg total) by mouth daily. 05/19/15  Yes Rubbie Battiest, NP  cholecalciferol (VITAMIN D) 1000 UNITS tablet Take 1,000 Units by mouth daily.   Yes Historical Provider, MD  estradiol (VIVELLE-DOT) 0.05 MG/24HR patch Place 1 patch onto the skin 2 (two) times a week.   Yes Historical Provider, MD  hydrochlorothiazide (HYDRODIURIL) 12.5 MG tablet Take 12.5 mg by mouth daily. For meniere's   Yes Historical Provider, MD  Melatonin 10 MG CAPS Take 1 capsule by mouth at bedtime.   Yes Historical Provider, MD  meloxicam (MOBIC) 7.5 MG tablet Take 7.5 mg by mouth as needed for pain.   Yes Historical Provider, MD  omeprazole (PRILOSEC) 10 MG capsule Take 10 mg by mouth daily.   Yes Historical Provider, MD  Ciprofloxacin (CIPRO PO) Take by mouth.    Historical Provider, MD   ciprofloxacin-dexamethasone (CIPRODEX) otic suspension Place 4 drops into both ears 2 (two) times daily. 05/13/15   Carloyn Manner, MD  clindamycin (CLEOCIN) 300 MG capsule Take 1 capsule (300 mg total) by mouth 3 (three) times daily. 07/12/15 07/19/15  Andria Meuse, NP  fluticasone (FLONASE) 50 MCG/ACT nasal spray Place 2 sprays into the nose as needed.    Historical Provider, MD   Meds Ordered and Administered this Visit  Medications - No data to display  BP 121/76 mmHg  Pulse 104  Temp(Src) 98.1 F (36.7 C) (Oral)  Resp 18  Ht 5\' 5"  (1.651 m)  Wt 155 lb (70.308 kg)  BMI 25.79 kg/m2  SpO2 100% No data found. HR 70 apically  Physical Exam  Constitutional: She is oriented to person, place, and time. She appears well-developed and well-nourished. No distress.  HENT:  Head: Normocephalic and atraumatic.  Eyes: Conjunctivae are normal. No scleral icterus.  Neck: Normal range of motion. Neck supple. No thyromegaly present.  Cardiovascular: Normal rate and regular rhythm.   Pulmonary/Chest: Effort normal and breath sounds normal. No respiratory distress.  Abdominal: Soft. Bowel sounds are normal. She exhibits no distension.  Musculoskeletal: Normal range of motion. She exhibits no edema or tenderness.  Neurological: She is alert and oriented to person, place, and time. No cranial nerve deficit.  Skin: Skin is warm and dry. No rash noted. No erythema.  1cm nummular lesion with scabbing.  Surrounding 3 x 3 cm demarcated erythematous changes to between right anterior shoulder and clavicle.  Second discrete nummular lesion dorsum of left hand minimally raised 0.5 cm. No exudates no surrounding erythema.   Psychiatric: She has a normal mood and affect. Her speech is normal and behavior is normal. Judgment and thought content normal.  Nursing note and vitals reviewed.   ED Course  Procedures none  MDM   1. Cellulitis of right upper extremity   Right shoulder likely secondary to  insect bite.  She also has a single hand lesion without complications.  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  Plan: Diagnosis reviewed with patient Rx as per orders;  benefits, risks, potential side effects reviewed  Warning signs discussed with patient to include fever, worsening redness, worsening rash, shortness of breath or any other symptoms to seek emergency care immediately Seek additional medical care if symptoms are not improving in 48-72 hours Keep area clean, dry & covered May use OTC benadryl as directed PRN itching   Andria Meuse, NP 07/12/15 1113

## 2015-07-14 ENCOUNTER — Ambulatory Visit (INDEPENDENT_AMBULATORY_CARE_PROVIDER_SITE_OTHER): Payer: 59 | Admitting: Nurse Practitioner

## 2015-07-14 ENCOUNTER — Encounter: Payer: Self-pay | Admitting: Nurse Practitioner

## 2015-07-14 VITALS — BP 101/80 | HR 118 | Temp 98.4°F | Wt 155.8 lb

## 2015-07-14 DIAGNOSIS — L0291 Cutaneous abscess, unspecified: Secondary | ICD-10-CM

## 2015-07-14 MED ORDER — MUPIROCIN 2 % EX OINT: 1.0000 "application " | TOPICAL_OINTMENT | Freq: Two times a day (BID) | CUTANEOUS | Status: DC

## 2015-07-14 NOTE — Progress Notes (Signed)
Patient ID: April Nguyen, female    DOB: 11-Mar-1951  Age: 64 y.o. MRN: [id number]  CC: Check insect   HPI April Nguyen presents for follow up of abscess from probable insect bite. Seen by UC.   1) 07/12/15 seen by Urgent Care for bug bites that supposedly were turning into cellulitis at that time. She has tried benadryl and neosporin creams without sustained relief. Denies other areas were affected after the visit, she denies pain, denies dizziness, SOB, or swelling of face/lips/tongue.   Right shoulder at bra strap- covered at start of visit.  Left wrist uncovered.   Patient asked advice from a wound care nurse and she was told to follow up with myself.   Continuing Clindamycin as instructed, denies side effects, tolerating well, and reports improvement in the sites of concern.   History April Nguyen has a past medical history of Depression; GERD (gastroesophageal reflux disease); Allergy; Genital warts; PONV (postoperative nausea and vomiting); Family history of adverse reaction to anesthesia; Seizures ([place]); Arthritis; Migraines; and Meniere's disease.   She has past surgical history that includes Cholecystectomy (2008); Tonsillectomy and adenoidectomy (1959); Abdominal hysterectomy; Tubal ligation (2010); dialation and cartarage (1982, 1999, 2000); Parotid gland tumor excision (1986); Ganglion cyst excision (2011, 2012); Finger surgery (2011, 2012); Finger surgery (2013); and Myringotomy with tube placement (Bilateral, 05/13/2015).   Her family history includes Arthritis in her mother; Diabetes in her mother; Heart disease in her father and mother; Hyperlipidemia in her father and mother; Hypertension in her mother; Stroke in her mother.She reports that she has never smoked. She has never used smokeless tobacco. She reports that she does not drink alcohol or use illicit drugs.  Outpatient Prescriptions Prior to Visit  Medication Sig Dispense Refill  . ALPRAZolam (XANAX) 1 MG tablet Take 1  tablet (1 mg total) by mouth at bedtime. 30 tablet 3  . Azelastine-Fluticasone 137-50 MCG/ACT SUSP Place 1 spray into the nose 2 (two) times daily. 23 g 2  . buPROPion (WELLBUTRIN XL) 150 MG 24 hr tablet Take 1 tablet (150 mg total) by mouth daily. 30 tablet 2  . cholecalciferol (VITAMIN D) 1000 UNITS tablet Take 1,000 Units by mouth daily.    . clindamycin (CLEOCIN) 300 MG capsule Take 1 capsule (300 mg total) by mouth 3 (three) times daily. 21 capsule 0  . estradiol (VIVELLE-DOT) 0.05 MG/24HR patch Place 1 patch onto the skin 2 (two) times a week.    . fluticasone (FLONASE) 50 MCG/ACT nasal spray Place 2 sprays into the nose as needed.    . hydrochlorothiazide (HYDRODIURIL) 12.5 MG tablet Take 12.5 mg by mouth daily. For meniere's    . Melatonin 10 MG CAPS Take 1 capsule by mouth at bedtime.    . meloxicam (MOBIC) 7.5 MG tablet Take 7.5 mg by mouth as needed for pain.    Marland Kitchen omeprazole (PRILOSEC) 10 MG capsule Take 10 mg by mouth daily.    . Ciprofloxacin (CIPRO PO) Take by mouth.    . ciprofloxacin-dexamethasone (CIPRODEX) otic suspension Place 4 drops into both ears 2 (two) times daily. (Patient not taking: Reported on 07/14/2015) 7.5 mL 0   No facility-administered medications prior to visit.    ROS Review of Systems  Constitutional: Negative for fever, chills, diaphoresis and fatigue.  Respiratory: Negative for chest tightness, shortness of breath and wheezing.   Cardiovascular: Negative for chest pain, palpitations and leg swelling.  Gastrointestinal: Negative for nausea, vomiting and diarrhea.  Skin: Positive for wound.  Neurological: Negative for  dizziness, weakness, numbness and headaches.    Objective:  BP 101/80 mmHg  Pulse 118  Temp(Src) 98.4 F (36.9 C)  Wt 155 lb 12 oz (70.648 kg)  SpO2 97%  Physical Exam  Constitutional: She is oriented to person, place, and time. She appears well-developed and well-nourished. No distress.  HENT:  Head: Normocephalic and atraumatic.   Right Ear: External ear normal.  Left Ear: External ear normal.  Eyes: Right eye exhibits no discharge. Left eye exhibits no discharge. No scleral icterus.  Neck: Normal range of motion. Neck supple.  Neurological: She is alert and oriented to person, place, and time. Coordination normal.  Skin: Skin is warm and dry. She is not diaphoretic. There is erythema.     Well healing abscesses with slight erythema and pustule centers. Tender to palpation, skin is intact at this time, pictures on pt phone show great improvement with comparison  Psychiatric: She has a normal mood and affect. Her behavior is normal. Judgment and thought content normal.   Assessment & Plan:   April Nguyen was seen today for check insect.  Diagnoses and all orders for this visit:  Abscess  Other orders -     mupirocin ointment (BACTROBAN) 2 %; Apply 1 application topically 2 (two) times daily.  I have discontinued April Nguyen's ciprofloxacin-dexamethasone. I am also having her start on mupirocin ointment. Additionally, I am having her maintain her estradiol, fluticasone, omeprazole, cholecalciferol, meloxicam, Melatonin, ALPRAZolam, Azelastine-Fluticasone, hydrochlorothiazide, Ciprofloxacin (CIPRO PO), and buPROPion.  Meds ordered this encounter  Medications  . mupirocin ointment (BACTROBAN) 2 %    Sig: Apply 1 application topically 2 (two) times daily.    Dispense:  22 g    Refill:  0    Order Specific Question:  Supervising Provider    Answer:  Crecencio Mc [[id number]]     Follow-up: Return if symptoms worsen or fail to improve.

## 2015-07-14 NOTE — Patient Instructions (Signed)
Warm compresses several times a day (warm and wet)   Mupirocin ointment- can use at any time (decolonizes for staph also and is topical)  Call me if not improving before Friday and we can lance it.

## 2015-07-20 DIAGNOSIS — L0291 Cutaneous abscess, unspecified: Secondary | ICD-10-CM | POA: Insufficient documentation

## 2015-07-20 NOTE — Assessment & Plan Note (Signed)
Two abscessed areas from probable bug bites (was around her plants prior to happening). Improved greatly with clindamycin. Asked pt to let us know if she gets diarrhea at any point between now and 3 months out. She works in [place] and was covered for [employer].   Asked her to try warm wet compresses several times daily to help with self drainage.  Mupirocin ointment sent to pharmacy.  RTC parameters given to pt and advised if it doesn't drain or improve further on its own we can I&D it in the office.   FU prn worsening/failure to improve.

## 2015-08-14 ENCOUNTER — Other Ambulatory Visit: Payer: Self-pay | Admitting: Nurse Practitioner

## 2015-08-21 ENCOUNTER — Encounter: Payer: Self-pay | Admitting: Nurse Practitioner

## 2015-08-21 ENCOUNTER — Ambulatory Visit (INDEPENDENT_AMBULATORY_CARE_PROVIDER_SITE_OTHER): Payer: 59 | Admitting: Nurse Practitioner

## 2015-08-21 VITALS — BP 100/64 | HR 92 | Temp 97.8°F | Ht 65.0 in | Wt 155.8 lb

## 2015-08-21 DIAGNOSIS — F418 Other specified anxiety disorders: Secondary | ICD-10-CM | POA: Diagnosis not present

## 2015-08-21 DIAGNOSIS — L0291 Cutaneous abscess, unspecified: Secondary | ICD-10-CM | POA: Diagnosis not present

## 2015-08-21 DIAGNOSIS — Z8619 Personal history of other infectious and parasitic diseases: Secondary | ICD-10-CM

## 2015-08-21 MED ORDER — BUPROPION HCL ER (XL) 150 MG PO TB24: 150.0000 mg | ORAL_TABLET | Freq: Every day | ORAL | Status: DC

## 2015-08-21 MED ORDER — ZOSTER VACCINE LIVE 19400 UNT/0.65ML ~~LOC~~ SOLR: 0.6500 mL | Freq: Once | SUBCUTANEOUS | Status: DC

## 2015-08-21 MED ORDER — ESTRADIOL 0.05 MG/24HR TD PTTW: 1.0000 | MEDICATED_PATCH | TRANSDERMAL | Status: DC

## 2015-08-21 NOTE — Progress Notes (Signed)
Pre visit review using our clinic review tool, if applicable. No additional management support is needed unless otherwise documented below in the visit note. 

## 2015-08-21 NOTE — Progress Notes (Signed)
Patient ID: April Nguyen, female    DOB: 03-Dec-1950  Age: 64 y.o. MRN: [id number]  CC: No chief complaint on file.   HPI CHARLISHA HAMBERG presents for 3 month follow up.   1) She has a list of questions she wanted to ask today.  One being a follow up on the bug bite on her shoulder. It has decreased in size and no further drainage, but she reports it left a scar.   2) Requesting refills on Wellbutrin XL, Estradiol, and to take off a few medications on her list.   3) She is requesting Zostavax today, but we do not have any that has not expired in stock. Will call into [facility] for her to receive.    History Tattianna has a past medical history of Depression; GERD (gastroesophageal reflux disease); Allergy; Genital warts; PONV (postoperative nausea and vomiting); Family history of adverse reaction to anesthesia; Seizures ([place]); Arthritis; Migraines; and Meniere's disease.   She has past surgical history that includes Cholecystectomy (2008); Tonsillectomy and adenoidectomy (1959); Abdominal hysterectomy; Tubal ligation (2010); dialation and cartarage (1982, 1999, 2000); Parotid gland tumor excision (1986); Ganglion cyst excision (2011, 2012); Finger surgery (2011, 2012); Finger surgery (2013); and Myringotomy with tube placement (Bilateral, 05/13/2015).   Her family history includes Arthritis in her mother; Diabetes in her mother; Heart disease in her father and mother; Hyperlipidemia in her father and mother; Hypertension in her mother; Stroke in her mother.She reports that she has never smoked. She has never used smokeless tobacco. She reports that she does not drink alcohol or use illicit drugs.  Outpatient Prescriptions Prior to Visit  Medication Sig Dispense Refill  . ALPRAZolam (XANAX) 1 MG tablet Take 1 tablet (1 mg total) by mouth at bedtime. 30 tablet 3  . cholecalciferol (VITAMIN D) 1000 UNITS tablet Take 1,000 Units by mouth daily.    Marland Kitchen DYMISTA 137-50 MCG/ACT SUSP PLACE 1 SPRAY  INTO THE NOSE 2 TIMES DAILY. 23 g 2  . hydrochlorothiazide (HYDRODIURIL) 12.5 MG tablet Take 12.5 mg by mouth daily. For meniere's    . Melatonin 10 MG CAPS Take 1 capsule by mouth at bedtime.    . meloxicam (MOBIC) 7.5 MG tablet Take 7.5 mg by mouth as needed for pain.    Marland Kitchen omeprazole (PRILOSEC) 10 MG capsule Take 10 mg by mouth daily.    Marland Kitchen buPROPion (WELLBUTRIN XL) 150 MG 24 hr tablet TAKE 1 TABLET BY MOUTH ONCE DAILY 30 tablet 2  . Ciprofloxacin (CIPRO PO) Take by mouth.    . estradiol (VIVELLE-DOT) 0.05 MG/24HR patch Place 1 patch onto the skin 2 (two) times a week.    . fluticasone (FLONASE) 50 MCG/ACT nasal spray Place 2 sprays into the nose as needed.    . mupirocin ointment (BACTROBAN) 2 % Apply 1 application topically 2 (two) times daily. 22 g 0   No facility-administered medications prior to visit.    ROS Review of Systems  Constitutional: Negative for fever, chills, diaphoresis and fatigue.  Respiratory: Negative for chest tightness, shortness of breath and wheezing.   Cardiovascular: Negative for chest pain, palpitations and leg swelling.  Gastrointestinal: Negative for nausea, vomiting and diarrhea.  Skin: Negative for rash.  Neurological: Negative for dizziness, weakness, numbness and headaches.  Psychiatric/Behavioral: The patient is not nervous/anxious.     Objective:  BP 100/64 mmHg  Pulse 92  Temp(Src) 97.8 F (36.6 C) (Oral)  Ht 5\' 5"  (1.651 m)  Wt 155 lb 12 oz (70.648 kg)  BMI 25.92 kg/m2  SpO2 93%  Physical Exam  Constitutional: She is oriented to person, place, and time. She appears well-developed and well-nourished. No distress.  HENT:  Head: Normocephalic and atraumatic.  Right Ear: External ear normal.  Left Ear: External ear normal.  Cardiovascular: Normal rate, regular rhythm and normal heart sounds.  Exam reveals no gallop and no friction rub.   No murmur heard. Pulmonary/Chest: Effort normal and breath sounds normal. No respiratory distress. She  has no wheezes. She has no rales. She exhibits no tenderness.  Neurological: She is alert and oriented to person, place, and time. No cranial nerve deficit. She exhibits normal muscle tone. Coordination normal.  Skin: Skin is warm and dry. No rash noted. She is not diaphoretic. There is erythema.  Hyperpigmented and slightly erythematous area at site of bug bite that progressed into abscess. No sign of infection today  Psychiatric: She has a normal mood and affect. Her behavior is normal. Judgment and thought content normal.      Assessment & Plan:   Diagnoses and all orders for this visit:  Abscess  History of shingles  Depression with anxiety  Other orders -     estradiol (VIVELLE-DOT) 0.05 MG/24HR patch; Place 1 patch (0.05 mg total) onto the skin 2 (two) times a week. -     buPROPion (WELLBUTRIN XL) 150 MG 24 hr tablet; Take 1 tablet (150 mg total) by mouth daily. -     Cancel: Varicella-zoster vaccine subcutaneous -     zoster vaccine live, PF, (ZOSTAVAX) 09811 UNT/0.65ML injection; Inject 19,400 Units into the skin once.  I have discontinued Ms. Sky's fluticasone, Ciprofloxacin (CIPRO PO), and mupirocin ointment. I have also changed her estradiol and buPROPion. Additionally, I am having her start on zoster vaccine live (PF). Lastly, I am having her maintain her omeprazole, cholecalciferol, meloxicam, Melatonin, ALPRAZolam, hydrochlorothiazide, and DYMISTA.  Meds ordered this encounter  Medications  . estradiol (VIVELLE-DOT) 0.05 MG/24HR patch    Sig: Place 1 patch (0.05 mg total) onto the skin 2 (two) times a week.    Dispense:  8 patch    Refill:  2    Order Specific Question:  Supervising Provider    Answer:  Deborra Medina L [[id number]]  . buPROPion (WELLBUTRIN XL) 150 MG 24 hr tablet    Sig: Take 1 tablet (150 mg total) by mouth daily.    Dispense:  30 tablet    Refill:  2    Order Specific Question:  Supervising Provider    Answer:  Deborra Medina L [[id number]]  . zoster  vaccine live, PF, (ZOSTAVAX) 91478 UNT/0.65ML injection    Sig: Inject 19,400 Units into the skin once.    Dispense:  1 each    Refill:  0    Order Specific Question:  Supervising Provider    Answer:  Crecencio Mc [[id number]]     Follow-up: Return in about 6 months (around 02/19/2016) for Follow up.

## 2015-08-21 NOTE — Patient Instructions (Signed)
Follow-up in 6 months    Happy Holidays!

## 2015-08-31 NOTE — Assessment & Plan Note (Signed)
Shingles vaccination called into [facility] due to no supply in-house today

## 2015-08-31 NOTE — Assessment & Plan Note (Signed)
Wellbutrin was called in to pharmacy will continue current regimen Stable

## 2015-08-31 NOTE — Assessment & Plan Note (Signed)
Improved today no further treatment needed at this time

## 2015-10-30 ENCOUNTER — Telehealth: Payer: Self-pay | Admitting: *Deleted

## 2015-10-30 NOTE — Telephone Encounter (Signed)
Medication refill for

## 2015-11-04 ENCOUNTER — Encounter: Payer: Self-pay | Admitting: Nurse Practitioner

## 2015-11-04 ENCOUNTER — Ambulatory Visit (INDEPENDENT_AMBULATORY_CARE_PROVIDER_SITE_OTHER): Payer: 59 | Admitting: Nurse Practitioner

## 2015-11-04 VITALS — BP 116/72 | HR 97 | Temp 98.5°F | Ht 65.0 in | Wt 154.2 lb

## 2015-11-04 DIAGNOSIS — K219 Gastro-esophageal reflux disease without esophagitis: Secondary | ICD-10-CM

## 2015-11-04 DIAGNOSIS — F418 Other specified anxiety disorders: Secondary | ICD-10-CM | POA: Diagnosis not present

## 2015-11-04 MED ORDER — OMEPRAZOLE 40 MG PO CPDR: 40.0000 mg | DELAYED_RELEASE_CAPSULE | Freq: Every day | ORAL | Status: DC

## 2015-11-04 MED ORDER — ESTRADIOL 0.05 MG/24HR TD PTTW: 1.0000 | MEDICATED_PATCH | TRANSDERMAL | Status: DC

## 2015-11-04 MED ORDER — ALPRAZOLAM 1 MG PO TABS: 1.0000 mg | ORAL_TABLET | Freq: Two times a day (BID) | ORAL | Status: DC | PRN

## 2015-11-04 MED ORDER — BUPROPION HCL ER (XL) 150 MG PO TB24: 150.0000 mg | ORAL_TABLET | Freq: Every day | ORAL | Status: DC

## 2015-11-04 NOTE — Progress Notes (Signed)
Pre visit review using our clinic review tool, if applicable. No additional management support is needed unless otherwise documented below in the visit note. 

## 2015-11-04 NOTE — Patient Instructions (Addendum)
Zantac 150 mg at night time. Omeprazole in the morning.   1-1.5 xanax at night

## 2015-11-04 NOTE — Progress Notes (Signed)
Patient ID: ERIELLE SPROWL, female    DOB: 1951/07/19  Age: 65 y.o. MRN: [id number]  CC: Acute Visit   HPI DARCIE PISTONE presents for CC of medication consultation.   1) Mother passed on 11-01-2022 and she has been sleeping poorly due to grief Also having worsening GERD related to grief. Wakes up with a "churning feeling" in her stomach She has been taking 2 of the prilosec with relief.     History Latwana has a past medical history of Depression; GERD (gastroesophageal reflux disease); Allergy; Genital warts; PONV (postoperative nausea and vomiting); Family history of adverse reaction to anesthesia; Seizures ([place]); Arthritis; Migraines; and Meniere's disease.   She has past surgical history that includes Cholecystectomy (2008); Tonsillectomy and adenoidectomy (1959); Abdominal hysterectomy; Tubal ligation (2010); dialation and cartarage (1982, 1999, 2000); Parotid gland tumor excision (1986); Ganglion cyst excision (2011, 2012); Finger surgery (2011, 2012); Finger surgery (2013); and Myringotomy with tube placement (Bilateral, 05/13/2015).   Her family history includes Arthritis in her mother; Diabetes in her mother; Heart disease in her father and mother; Hyperlipidemia in her father and mother; Hypertension in her mother; Stroke in her mother.She reports that she has never smoked. She has never used smokeless tobacco. She reports that she does not drink alcohol or use illicit drugs.  Outpatient Prescriptions Prior to Visit  Medication Sig Dispense Refill  . cholecalciferol (VITAMIN D) 1000 UNITS tablet Take 1,000 Units by mouth daily.    Marland Kitchen DYMISTA 137-50 MCG/ACT SUSP PLACE 1 SPRAY INTO THE NOSE 2 TIMES DAILY. 23 g 2  . hydrochlorothiazide (HYDRODIURIL) 12.5 MG tablet Take 12.5 mg by mouth daily. For meniere's    . Melatonin 10 MG CAPS Take 1 capsule by mouth at bedtime.    . meloxicam (MOBIC) 7.5 MG tablet Take 7.5 mg by mouth as needed for pain.    Marland Kitchen ALPRAZolam (XANAX) 1 MG tablet Take 1  tablet (1 mg total) by mouth at bedtime. 30 tablet 3  . buPROPion (WELLBUTRIN XL) 150 MG 24 hr tablet Take 1 tablet (150 mg total) by mouth daily. 30 tablet 2  . estradiol (VIVELLE-DOT) 0.05 MG/24HR patch Place 1 patch (0.05 mg total) onto the skin 2 (two) times a week. 8 patch 2  . omeprazole (PRILOSEC) 10 MG capsule Take 10 mg by mouth daily.    Marland Kitchen zoster vaccine live, PF, (ZOSTAVAX) 13086 UNT/0.65ML injection Inject 19,400 Units into the skin once. 1 each 0   No facility-administered medications prior to visit.    ROS Review of Systems  Constitutional: Negative for fever, chills, diaphoresis and fatigue.  Respiratory: Negative for chest tightness, shortness of breath and wheezing.   Cardiovascular: Negative for chest pain, palpitations and leg swelling.  Gastrointestinal: Negative for nausea, vomiting and diarrhea.  Skin: Negative for rash.  Neurological: Negative for dizziness, weakness, numbness and headaches.  Psychiatric/Behavioral: Positive for sleep disturbance. The patient is not nervous/anxious.     Objective:  BP 116/72 mmHg  Pulse 97  Temp(Src) 98.5 F (36.9 C) (Oral)  Ht 5\' 5"  (1.651 m)  Wt 154 lb 4 oz (69.967 kg)  BMI 25.67 kg/m2  SpO2 97%  Physical Exam  Constitutional: She is oriented to person, place, and time. She appears well-developed and well-nourished. No distress.  HENT:  Head: Normocephalic and atraumatic.  Right Ear: External ear normal.  Left Ear: External ear normal.  Cardiovascular: Normal rate, regular rhythm and normal heart sounds.  Exam reveals no gallop and no friction rub.  No murmur heard. Pulmonary/Chest: Effort normal and breath sounds normal. No respiratory distress. She has no wheezes. She has no rales. She exhibits no tenderness.  Neurological: She is alert and oriented to person, place, and time. No cranial nerve deficit. She exhibits normal muscle tone. Coordination normal.  Skin: Skin is warm and dry. No rash noted. She is not  diaphoretic.  Psychiatric: She has a normal mood and affect. Her behavior is normal. Judgment and thought content normal.      Assessment & Plan:   Vernette was seen today for acute visit.  Diagnoses and all orders for this visit:  Depression with anxiety  Gastroesophageal reflux disease without esophagitis  Other orders -     estradiol (VIVELLE-DOT) 0.05 MG/24HR patch; Place 1 patch (0.05 mg total) onto the skin 2 (two) times a week. -     ALPRAZolam (XANAX) 1 MG tablet; Take 1 tablet (1 mg total) by mouth 2 (two) times daily as needed for anxiety. -     buPROPion (WELLBUTRIN XL) 150 MG 24 hr tablet; Take 1 tablet (150 mg total) by mouth daily. -     omeprazole (PRILOSEC) 40 MG capsule; Take 1 capsule (40 mg total) by mouth daily.   I have discontinued Ms. Husk's omeprazole, zoster vaccine live (PF), and omeprazole. I have also changed her ALPRAZolam. Additionally, I am having her start on omeprazole. Lastly, I am having her maintain her cholecalciferol, meloxicam, Melatonin, hydrochlorothiazide, DYMISTA, estradiol, and buPROPion.  Meds ordered this encounter  Medications  . DISCONTD: omeprazole (PRILOSEC) 40 MG capsule    Sig:     Refill:  11  . estradiol (VIVELLE-DOT) 0.05 MG/24HR patch    Sig: Place 1 patch (0.05 mg total) onto the skin 2 (two) times a week.    Dispense:  24 patch    Refill:  3    Can we do a 3 month supply?    Order Specific Question:  Supervising Provider    Answer:  Crecencio Mc [[id number]]  . ALPRAZolam (XANAX) 1 MG tablet    Sig: Take 1 tablet (1 mg total) by mouth 2 (two) times daily as needed for anxiety.    Dispense:  60 tablet    Refill:  1    Order Specific Question:  Supervising Provider    Answer:  Deborra Medina L [[id number]]  . buPROPion (WELLBUTRIN XL) 150 MG 24 hr tablet    Sig: Take 1 tablet (150 mg total) by mouth daily.    Dispense:  90 tablet    Refill:  3    Order Specific Question:  Supervising Provider    Answer:  Deborra Medina L  [[id number]]  . omeprazole (PRILOSEC) 40 MG capsule    Sig: Take 1 capsule (40 mg total) by mouth daily.    Dispense:  90 capsule    Refill:  3    Order Specific Question:  Supervising Provider    Answer:  Crecencio Mc [[id number]]     Follow-up: Return if symptoms worsen or fail to improve.

## 2015-11-08 NOTE — Assessment & Plan Note (Addendum)
Prilosec refilled today. Advised instead of 2 caps daily, take 1 in the morning and 1 zantac OTC prn at night for reflux help

## 2015-11-08 NOTE — Assessment & Plan Note (Addendum)
Grief from mother's passing worsening symptoms Upped the xanax to 1-2 tablets daily for anxiety/ including the 1 for sleep at night Advised 1/2 tablet as needed to avoid tolerance  FU prn worsening/failure to improve.  Wellbutrin Refilled

## 2015-11-16 ENCOUNTER — Telehealth: Payer: Self-pay

## 2015-11-16 NOTE — Telephone Encounter (Signed)
PA started on Cover my meds for Vivelle dot Estradiol Patches.

## 2016-01-11 ENCOUNTER — Other Ambulatory Visit: Payer: Self-pay | Admitting: Nurse Practitioner

## 2016-01-22 ENCOUNTER — Ambulatory Visit (INDEPENDENT_AMBULATORY_CARE_PROVIDER_SITE_OTHER): Payer: 59 | Admitting: Primary Care

## 2016-01-22 ENCOUNTER — Encounter: Payer: Self-pay | Admitting: Primary Care

## 2016-01-22 VITALS — BP 120/76 | HR 104 | Temp 98.4°F | Ht 65.0 in | Wt 154.4 lb

## 2016-01-22 DIAGNOSIS — G4700 Insomnia, unspecified: Secondary | ICD-10-CM | POA: Diagnosis not present

## 2016-01-22 DIAGNOSIS — F418 Other specified anxiety disorders: Secondary | ICD-10-CM | POA: Diagnosis not present

## 2016-01-22 DIAGNOSIS — H6983 Other specified disorders of Eustachian tube, bilateral: Secondary | ICD-10-CM

## 2016-01-22 DIAGNOSIS — K219 Gastro-esophageal reflux disease without esophagitis: Secondary | ICD-10-CM | POA: Diagnosis not present

## 2016-01-22 DIAGNOSIS — M1990 Unspecified osteoarthritis, unspecified site: Secondary | ICD-10-CM

## 2016-01-22 MED ORDER — ESZOPICLONE 1 MG PO TABS: 1.0000 mg | ORAL_TABLET | Freq: Every evening | ORAL | Status: DC | PRN

## 2016-01-22 MED ORDER — ALPRAZOLAM 0.5 MG PO TABS: ORAL_TABLET | ORAL | Status: DC

## 2016-01-22 NOTE — Progress Notes (Signed)
Subjective:    Patient ID: April Nguyen, female    DOB: August 02, 1951, 65 y.o.   MRN: [id number]  HPI  April Nguyen is a 65 year old female who presents today to transfer care from [facility]. She is due for her annual physical.   1) Anxiety and Depression: Currently managed on Wellbutrin XL 150 mg and alprazolam 1 mg. Her Alprazolam was recently increased in March 2017 as she was grieving her mother's recent passing. She is currently taking 1/2 alprazolam at bedtime for insomnia and 1/4 tablet during the day occasionally. She's been on Alprazolam for the past 8 years. Her mother ran over a man with her vehicle 8 years ago which killed him instantly. She has flashbacks from seeing his body in the street. She has panic attacks when riding in vehicles and also at night. [place] not [place] the interstate.  Overall she feels well managed.   2) GERD: Currently managed on Omeprazole 40 mg every morning. She will occasionally take Zantac at bedtime. Without her medication she will experience esophageal burning and throat fullness.  3) Meniere's Disease: Currently managed on HCTZ 12.5 mg and Dymista for eustation tube narrowing.   4) Arthritis: Located to her fingers and hands, back, right foot. History of and currently managed on Meloxicam 7.5 mg as needed. Also tylenol or Aleve. She's had 2 bone fusions in her fingers.   5) Insomnia: Present for the past 8 years. She has difficulty staying asleep, intermittently several nights per week. She is currently managed on Alprazolam 0.5 mg HS and Melatonin 50 mg. She once trialed on Ambien 8 years ago which caused her to nearly fall when waking. She's also tried [place] but doesn't remember any abnormal side effects.   6) Mood swings, headaches: Currently managed on estradiol patch for the past 14-15 years and changes them twice weekly. She feels well managed on these patches.   Review of Systems  HENT:       History of Meniere's  Respiratory:  Negative for shortness of breath.   Cardiovascular: Negative for chest pain.  Gastrointestinal:       Gerd  Musculoskeletal: Positive for arthralgias.  Psychiatric/Behavioral: Positive for sleep disturbance.       See HPI       Past Medical History  Diagnosis Date  . Depression   . GERD (gastroesophageal reflux disease)   . Allergy     Seasonal  . Genital warts     In past  . PONV (postoperative nausea and vomiting)     and headaches  . Family history of adverse reaction to anesthesia     brother - PONV  . Seizures ([place])     Dx 16 yrs ago - hormonal - none at least 3 yrs  . Arthritis     neck, hands, feet  . Migraines     sinus/stress  . Meniere's disease      Social History   Social History  . Marital Status: Married    Spouse Name: N/A  . Number of Children: N/A  . Years of Education: N/A   Occupational History  . Not on file.   Social History Main Topics  . Smoking status: Never Smoker   . Smokeless tobacco: Never Used  . Alcohol Use: No  . Drug Use: No  . Sexual Activity:    Partners: Male     Comment: Husband   Other Topics Concern  . Not on file   Social  History Narrative   Work at the [employer] at [employer] with husband   Children- daughter and son    Grandchildren- 6    Caffeine- 1 cup in the morning, soda occasionally, green tea hot/cold   Enjoys reading, spending time at [employer], spending time with family.    Past Surgical History  Procedure Laterality Date  . Cholecystectomy  2008  . Tonsillectomy and adenoidectomy  1959  . Abdominal hysterectomy    . Tubal ligation  2010  . Dialation and [place], 1999, 2000  . Parotid gland tumor excision  1986  . Ganglion cyst excision  2011, 2012    Left and right hands  . Finger surgery  2011, 2012    Bone fusion of middle finger right and left hands  . Finger surgery  2013    Pin removed from left middle finger  . Myringotomy with tube placement Bilateral 05/13/2015     Procedure: MYRINGOTOMY WITH  BUTTERFLY TUBE PLACEMENT;  Surgeon: Carloyn Manner, MD;  Location: [facility];  Service: ENT;  Laterality: Bilateral;    Family History  Problem Relation Age of Onset  . Arthritis Mother   . Hyperlipidemia Mother   . Hypertension Mother   . Stroke Mother   . Heart disease Mother   . Diabetes Mother   . Hyperlipidemia Father   . Heart disease Father     Allergies  Allergen Reactions  . Septra [Sulfamethoxazole-Trimethoprim] Rash    Current Outpatient Prescriptions on File Prior to Visit  Medication Sig Dispense Refill  . buPROPion (WELLBUTRIN XL) 150 MG 24 hr tablet Take 1 tablet (150 mg total) by mouth daily. 90 tablet 3  . cholecalciferol (VITAMIN D) 1000 UNITS tablet Take 1,000 Units by mouth daily.    Marland Kitchen DYMISTA 137-50 MCG/ACT SUSP PLACE 1 SPRAY INTO THE NOSE 2 TIMES DAILY. 23 g 3  . estradiol (VIVELLE-DOT) 0.05 MG/24HR patch Place 1 patch (0.05 mg total) onto the skin 2 (two) times a week. 24 patch 3  . hydrochlorothiazide (HYDRODIURIL) 12.5 MG tablet Take 12.5 mg by mouth daily. For meniere's    . Melatonin 10 MG CAPS Take 1 capsule by mouth at bedtime.    . meloxicam (MOBIC) 7.5 MG tablet Take 7.5 mg by mouth as needed for pain.    Marland Kitchen omeprazole (PRILOSEC) 40 MG capsule Take 1 capsule (40 mg total) by mouth daily. 90 capsule 3   No current facility-administered medications on file prior to visit.    BP 120/76 mmHg  Pulse 104  Temp(Src) 98.4 F (36.9 C) (Oral)  Ht 5\' 5"  (1.651 m)  Wt 154 lb 6.4 oz (70.035 kg)  BMI 25.69 kg/m2  SpO2 95%    Objective:   Physical Exam  Constitutional: She appears well-nourished.  Neck: Neck supple.  Cardiovascular: Normal rate and regular rhythm.   Pulmonary/Chest: Effort normal and breath sounds normal.  Skin: Skin is warm and dry.  Psychiatric: She has a normal mood and affect.          Assessment & Plan:

## 2016-01-22 NOTE — Assessment & Plan Note (Signed)
Feels well managed on Wellbutrin and Alprazolam. Reduced dose of alprazolam ordered as she was taking 1/2 tablet HS and 1/4 tablet PRN. Controlled substance contract on file.

## 2016-01-22 NOTE — Assessment & Plan Note (Signed)
Managed on Dymista, feels well managed.

## 2016-01-22 NOTE — Progress Notes (Signed)
Pre visit review using our clinic review tool, if applicable. No additional management support is needed unless otherwise documented below in the visit note. 

## 2016-01-22 NOTE — Assessment & Plan Note (Signed)
Stable on omeprazole 40 mg. Has tried reduction in past without success. Occasionally takes Zantac HS.

## 2016-01-22 NOTE — Assessment & Plan Note (Signed)
Takes tylenol mostly, occasionally Mobic. Stable.

## 2016-01-22 NOTE — Assessment & Plan Note (Signed)
Present for 8 years.  Takes Alprazolam HS which helps for her to fall asleep, but will wake up several times weekly. She's taking an extremely high dose of Melatonin as lower doses are not effective. Will d/c Melatonin, have her trail low dose Lunesta. Advised not to take Alprazolam with Lunesta. She will update me in 3-4 weeks.

## 2016-01-22 NOTE — Patient Instructions (Signed)
Stop taking 50 mg of Melatonin.  Start Lunesta. Take 1 tablet by mouth 30 minutes to 1 hour prior to sleep.  Try not to take the Alprazolam with the Lunesta if possible. Keep me updated!  Please schedule a physical with me within the next 3 months. You may also schedule a lab only appointment 3-4 days prior. We will discuss your lab results in detail during your physical.  It was a pleasure to meet you today! Please don't hesitate to call me with any questions. Welcome to [facility]!

## 2016-01-28 ENCOUNTER — Encounter: Payer: 59 | Admitting: Primary Care

## 2016-02-05 ENCOUNTER — Other Ambulatory Visit: Payer: Self-pay | Admitting: Primary Care

## 2016-02-05 DIAGNOSIS — Z13220 Encounter for screening for lipoid disorders: Secondary | ICD-10-CM

## 2016-02-05 DIAGNOSIS — Z131 Encounter for screening for diabetes mellitus: Secondary | ICD-10-CM

## 2016-02-05 DIAGNOSIS — Z0000 Encounter for general adult medical examination without abnormal findings: Secondary | ICD-10-CM

## 2016-02-09 ENCOUNTER — Other Ambulatory Visit (INDEPENDENT_AMBULATORY_CARE_PROVIDER_SITE_OTHER): Payer: 59

## 2016-02-09 DIAGNOSIS — Z13220 Encounter for screening for lipoid disorders: Secondary | ICD-10-CM

## 2016-02-09 DIAGNOSIS — Z0000 Encounter for general adult medical examination without abnormal findings: Secondary | ICD-10-CM

## 2016-02-09 DIAGNOSIS — Z131 Encounter for screening for diabetes mellitus: Secondary | ICD-10-CM

## 2016-02-09 LAB — COMPREHENSIVE METABOLIC PANEL
ALK PHOS: 52 U/L (ref 39–117)
ALT: 18 U/L (ref 0–35)
AST: 16 U/L (ref 0–37)
Albumin: 4.1 g/dL (ref 3.5–5.2)
BILIRUBIN TOTAL: 0.4 mg/dL (ref 0.2–1.2)
BUN: 8 mg/dL (ref 6–23)
CALCIUM: 9.3 mg/dL (ref 8.4–10.5)
CO2: 31 meq/L (ref 19–32)
CREATININE: 0.51 mg/dL (ref 0.40–1.20)
Chloride: 102 mEq/L (ref 96–112)
GFR: 129.63 mL/min (ref >60.00)
GLUCOSE: 91 mg/dL (ref 70–99)
Potassium: 3.9 mEq/L (ref 3.5–5.1)
Sodium: 139 mEq/L (ref 135–145)
Total Protein: 7.2 g/dL (ref 6.0–8.3)

## 2016-02-09 LAB — VITAMIN D 25 HYDROXY (VIT D DEFICIENCY, FRACTURES): VITD: 34.5 ng/mL (ref 30.00–100.00)

## 2016-02-09 LAB — CBC
HCT: 41 % (ref 36.0–46.0)
Hemoglobin: 13.8 g/dL (ref 12.0–15.0)
MCHC: 33.7 g/dL (ref 30.0–36.0)
MCV: 94.1 fl (ref 78.0–100.0)
PLATELETS: 391 10*3/uL (ref 150.0–400.0)
RBC: 4.35 Mil/uL (ref 3.87–5.11)
RDW: 13.2 % (ref 11.5–15.5)
WBC: 4.3 10*3/uL (ref 4.0–10.5)

## 2016-02-09 LAB — TSH: TSH: 0.83 u[IU]/mL (ref 0.35–4.50)

## 2016-02-09 LAB — LIPID PANEL
CHOLESTEROL: 239 mg/dL — AB (ref 0–200)
HDL: 55.5 mg/dL (ref >39.00)
LDL CALC: 160 mg/dL — AB (ref 0–99)
NonHDL: 183.62
Total CHOL/HDL Ratio: 4
Triglycerides: 116 mg/dL (ref 0.0–149.0)
VLDL: 23.2 mg/dL (ref 0.0–40.0)

## 2016-02-09 LAB — HEMOGLOBIN A1C: HEMOGLOBIN A1C: 5.7 % (ref 4.6–6.5)

## 2016-02-11 ENCOUNTER — Ambulatory Visit (INDEPENDENT_AMBULATORY_CARE_PROVIDER_SITE_OTHER): Payer: 59 | Admitting: Primary Care

## 2016-02-11 ENCOUNTER — Encounter: Payer: Self-pay | Admitting: Primary Care

## 2016-02-11 VITALS — BP 118/78 | HR 94 | Temp 97.9°F | Ht 65.0 in | Wt 156.8 lb

## 2016-02-11 DIAGNOSIS — Z1239 Encounter for other screening for malignant neoplasm of breast: Secondary | ICD-10-CM

## 2016-02-11 DIAGNOSIS — G4700 Insomnia, unspecified: Secondary | ICD-10-CM

## 2016-02-11 DIAGNOSIS — Z0001 Encounter for general adult medical examination with abnormal findings: Secondary | ICD-10-CM | POA: Insufficient documentation

## 2016-02-11 DIAGNOSIS — E785 Hyperlipidemia, unspecified: Secondary | ICD-10-CM

## 2016-02-11 DIAGNOSIS — Z0000 Encounter for general adult medical examination without abnormal findings: Secondary | ICD-10-CM | POA: Diagnosis not present

## 2016-02-11 NOTE — Progress Notes (Signed)
Subjective:    Patient ID: OTERIA HOSKINS, female    DOB: 1950-10-12, 65 y.o.   MRN: [id number]  HPI  Ms. Molenaar is a 65 year old female who presents today for complete physical.  Immunizations: -Tetanus: Unsure, maybe in 2013. She will find out. -Influenza: Completed in Fall 2016 -Shingles: Completed in February 2017  Diet: She endorses a fair diet. Breakfast: Oatmeal Lunch: Salad, vegetable Dinner: Salad, vegetables, chips and salsa, skips Snacks: None Desserts: Ice cream, cake. Eats 2-3 times weekly. Beverages: Water, coffee, coke zero, green tea  Exercise: She walks 4 times weekly for about 20 minutes. Eye exam: Completed several years ago. Dental exam: Completes semi-annually. Colonoscopy: Completed in 2005, normal. Due. She plans to schedule.  Pap Smear: Hysterectomy Mammogram: Completed several years ago. Due.   Review of Systems  Constitutional: Negative for unexpected weight change.  HENT: Negative for rhinorrhea.   Respiratory: Negative for cough and shortness of breath.   Cardiovascular: Negative for chest pain.  Gastrointestinal: Negative for diarrhea and constipation.  Genitourinary: Negative for difficulty urinating and menstrual problem.  Musculoskeletal: Positive for arthralgias. Negative for myalgias.  Skin: Negative for rash.  Allergic/Immunologic: Negative for environmental allergies.  Neurological: Negative for dizziness, numbness and headaches.  Psychiatric/Behavioral:       Denies concerns for anxiety and depression. Has noticed improvement with Lunesta.        Past Medical History  Diagnosis Date  . Depression   . GERD (gastroesophageal reflux disease)   . Allergy     Seasonal  . Genital warts     In past  . PONV (postoperative nausea and vomiting)     and headaches  . Family history of adverse reaction to anesthesia     brother - PONV  . Seizures ([place])     Dx 16 yrs ago - hormonal - none at least 3 yrs  . Arthritis     neck, hands,  feet  . Migraines     sinus/stress  . Meniere's disease      Social History   Social History  . Marital Status: Married    Spouse Name: N/A  . Number of Children: N/A  . Years of Education: N/A   Occupational History  . Not on file.   Social History Main Topics  . Smoking status: Never Smoker   . Smokeless tobacco: Never Used  . Alcohol Use: No  . Drug Use: No  . Sexual Activity:    Partners: Male     Comment: Husband   Other Topics Concern  . Not on file   Social History Narrative   Work at the [employer] at [employer] with husband   Children- daughter and son    Grandchildren- 6    Caffeine- 1 cup in the morning, soda occasionally, green tea hot/cold   Enjoys reading, spending time at [employer], spending time with family.    Past Surgical History  Procedure Laterality Date  . Cholecystectomy  2008  . Tonsillectomy and adenoidectomy  1959  . Abdominal hysterectomy    . Tubal ligation  2010  . Dialation and [place], 1999, 2000  . Parotid gland tumor excision  1986  . Ganglion cyst excision  2011, 2012    Left and right hands  . Finger surgery  2011, 2012    Bone fusion of middle finger right and left hands  . Finger surgery  2013    Pin removed from left middle finger  .  Myringotomy with tube placement Bilateral 05/13/2015    Procedure: MYRINGOTOMY WITH  BUTTERFLY TUBE PLACEMENT;  Surgeon: Carloyn Manner, MD;  Location: [facility];  Service: ENT;  Laterality: Bilateral;    Family History  Problem Relation Age of Onset  . Arthritis Mother   . Hyperlipidemia Mother   . Hypertension Mother   . Stroke Mother   . Heart disease Mother   . Diabetes Mother   . Hyperlipidemia Father   . Heart disease Father     Allergies  Allergen Reactions  . Septra [Sulfamethoxazole-Trimethoprim] Rash    Current Outpatient Prescriptions on File Prior to Visit  Medication Sig Dispense Refill  . ALPRAZolam (XANAX) 0.5 MG tablet Take 1 tablet by  mouth at bedtime as needed for insomnia. Take 1/2 tablet by mouth no more than twice daily as needed for anxiety. 60 tablet 0  . buPROPion (WELLBUTRIN XL) 150 MG 24 hr tablet Take 1 tablet (150 mg total) by mouth daily. 90 tablet 3  . cholecalciferol (VITAMIN D) 1000 UNITS tablet Take 1,000 Units by mouth daily.    Marland Kitchen DYMISTA 137-50 MCG/ACT SUSP PLACE 1 SPRAY INTO THE NOSE 2 TIMES DAILY. 23 g 3  . estradiol (VIVELLE-DOT) 0.05 MG/24HR patch Place 1 patch (0.05 mg total) onto the skin 2 (two) times a week. 24 patch 3  . eszopiclone (LUNESTA) 1 MG TABS tablet Take 1 tablet (1 mg total) by mouth at bedtime as needed for sleep. Take immediately before bedtime 30 tablet 0  . hydrochlorothiazide (HYDRODIURIL) 12.5 MG tablet Take 12.5 mg by mouth daily. For meniere's    . meloxicam (MOBIC) 7.5 MG tablet Take 7.5 mg by mouth as needed for pain.    Marland Kitchen omeprazole (PRILOSEC) 40 MG capsule Take 1 capsule (40 mg total) by mouth daily. 90 capsule 3   No current facility-administered medications on file prior to visit.    BP 118/78 mmHg  Pulse 94  Temp(Src) 97.9 F (36.6 C) (Oral)  Ht 5\' 5"  (1.651 m)  Wt 156 lb 12.8 oz (71.124 kg)  BMI 26.09 kg/m2  SpO2 98%    Objective:   Physical Exam  Constitutional: She is oriented to person, place, and time. She appears well-nourished.  HENT:  Right Ear: Tympanic membrane and ear canal normal.  Left Ear: Tympanic membrane and ear canal normal.  Nose: Nose normal.  Mouth/Throat: Oropharynx is clear and moist.  Eyes: Conjunctivae and EOM are normal. Pupils are equal, round, and reactive to light.  Neck: Neck supple. No thyromegaly present.  Cardiovascular: Normal rate and regular rhythm.   No murmur heard. Pulmonary/Chest: Effort normal and breath sounds normal. She has no rales.  Abdominal: Soft. Bowel sounds are normal. There is no tenderness.  Musculoskeletal: Normal range of motion.  Lymphadenopathy:    She has no cervical adenopathy.  Neurological: She  is alert and oriented to person, place, and time. She has normal reflexes. No cranial nerve deficit.  Skin: Skin is warm and dry. No rash noted.  Psychiatric: She has a normal mood and affect.          Assessment & Plan:

## 2016-02-11 NOTE — Progress Notes (Signed)
Pre visit review using our clinic review tool, if applicable. No additional management support is needed unless otherwise documented below in the visit note. 

## 2016-02-11 NOTE — Assessment & Plan Note (Signed)
TD up-to-date per patient. Zostavax completed in February 2017. Up-to-date on flu. Mammogram due and ordered today. Colonoscopy due, she will schedule. Recommended annual eye exam as she has not completed in several years. Discussed diet and to reduce sweets, increase lean protein, start exercising. Exam unremarkable. Labs with slight elevation in lipids, will allow her to work on exercise and recheck in 1 year.  Follow-up in one year for repeat physical.

## 2016-02-11 NOTE — Patient Instructions (Addendum)
You will be contacted regarding your Mammogram.  Please let us know if you have not heard back within one week.   Please schedule your colonoscopy and annual eye exam.  Call employee health in regards to your last tetanus vaccination.  Your cholesterol is slightly above goal. Continue consumption of salads and vegetables.  Continue exercising. You should be getting 1 hour of moderate intensity exercise 5 days weekly.  Notify me when you need a refill of your Lunesta.  Follow up in 1 year for repeat physical or sooner if needed.  It was a pleasure to see you today!

## 2016-02-11 NOTE — Assessment & Plan Note (Signed)
Total cholesterol and LDL slightly above goal on recent labs. Will have her work on diet and exercise and recheck in 1 year.

## 2016-02-11 NOTE — Assessment & Plan Note (Signed)
Improved with recent prescription of Lunesta. Now only using alprazolam very sparingly. Goal is to wean off alprazolam. UDS is up-to-date. Continue Lunesta.

## 2016-02-19 ENCOUNTER — Ambulatory Visit: Payer: 59 | Admitting: Nurse Practitioner

## 2016-02-24 ENCOUNTER — Other Ambulatory Visit: Payer: Self-pay | Admitting: Primary Care

## 2016-02-24 DIAGNOSIS — G4700 Insomnia, unspecified: Secondary | ICD-10-CM

## 2016-02-24 MED ORDER — ESZOPICLONE 1 MG PO TABS: 1.0000 mg | ORAL_TABLET | Freq: Every evening | ORAL | Status: DC | PRN

## 2016-02-24 NOTE — Telephone Encounter (Addendum)
Electronically refill request for   eszopiclone (LUNESTA) 1 MG TABS tablet   Take 1 tablet (1 mg total) by mouth at bedtime as needed for sleep. Take immediately before bedtime  Dispense: 30 tablet   Refills: 0     Last prescribed on 01/22/2016. Last seen on 02/11/2016. Next CPE on 02/10/2017.

## 2016-02-24 NOTE — Telephone Encounter (Signed)
Called in [place] to [place].

## 2016-03-02 ENCOUNTER — Ambulatory Visit (INDEPENDENT_AMBULATORY_CARE_PROVIDER_SITE_OTHER): Payer: 59 | Admitting: Primary Care

## 2016-03-02 VITALS — BP 120/82 | HR 100 | Temp 97.9°F | Ht 65.0 in | Wt 156.8 lb

## 2016-03-02 DIAGNOSIS — N9489 Other specified conditions associated with female genital organs and menstrual cycle: Secondary | ICD-10-CM

## 2016-03-02 DIAGNOSIS — R102 Pelvic and perineal pain: Secondary | ICD-10-CM

## 2016-03-02 LAB — POC URINALSYSI DIPSTICK (AUTOMATED)
BILIRUBIN UA: NEGATIVE
GLUCOSE UA: NEGATIVE
Ketones, UA: NEGATIVE
Leukocytes, UA: NEGATIVE
NITRITE UA: NEGATIVE
PH UA: 6
PROTEIN UA: NEGATIVE
RBC UA: NEGATIVE
SPEC GRAV UA: 1.015
UROBILINOGEN UA: NEGATIVE

## 2016-03-02 NOTE — Addendum Note (Signed)
Addended by: Jacqualin Combes on: 03/02/2016 11:37 AM   Modules accepted: Orders

## 2016-03-02 NOTE — Progress Notes (Signed)
Subjective:    Patient ID: April Nguyen, female    DOB: May 01, 1951, 65 y.o.   MRN: [id number]  HPI  April Nguyen is a 65 year old female who presents today with a chief complaint of pelvic pressure. She also reports lower back pain. Her pressure has been present for the past 1 month. She feels as though her bladder has descended to her vaginal cavity as she can feel a mass when she wipes. She underwent a partial hysterectomy 15 years ago. Denies urinary frequency, hematuria, fevers, vaginal itching, vaginal discharge. She doesn't feel as though she's passing stools like she typically does.   Review of Systems  Constitutional: Negative for fever.  Gastrointestinal: Negative for abdominal pain.  Genitourinary: Negative for dysuria, urgency, frequency and hematuria.       Pelvic pressure  Musculoskeletal: Positive for back pain.       Past Medical History  Diagnosis Date  . Depression   . GERD (gastroesophageal reflux disease)   . Allergy     Seasonal  . Genital warts     In past  . PONV (postoperative nausea and vomiting)     and headaches  . Family history of adverse reaction to anesthesia     brother - PONV  . Seizures ([place])     Dx 16 yrs ago - hormonal - none at least 3 yrs  . Arthritis     neck, hands, feet  . Migraines     sinus/stress  . Meniere's disease      Social History   Social History  . Marital Status: Married    Spouse Name: N/A  . Number of Children: N/A  . Years of Education: N/A   Occupational History  . Not on file.   Social History Main Topics  . Smoking status: Never Smoker   . Smokeless tobacco: Never Used  . Alcohol Use: No  . Drug Use: No  . Sexual Activity:    Partners: Male     Comment: Husband   Other Topics Concern  . Not on file   Social History Narrative   Work at the [employer] at [employer] with husband   Children- daughter and son    Grandchildren- 6    Caffeine- 1 cup in the morning, soda occasionally, green tea  hot/cold   Enjoys reading, spending time at [employer], spending time with family.    Past Surgical History  Procedure Laterality Date  . Cholecystectomy  2008  . Tonsillectomy and adenoidectomy  1959  . Abdominal hysterectomy    . Tubal ligation  2010  . Dialation and [place], 1999, 2000  . Parotid gland tumor excision  1986  . Ganglion cyst excision  2011, 2012    Left and right hands  . Finger surgery  2011, 2012    Bone fusion of middle finger right and left hands  . Finger surgery  2013    Pin removed from left middle finger  . Myringotomy with tube placement Bilateral 05/13/2015    Procedure: MYRINGOTOMY WITH  BUTTERFLY TUBE PLACEMENT;  Surgeon: Carloyn Manner, MD;  Location: [facility];  Service: ENT;  Laterality: Bilateral;    Family History  Problem Relation Age of Onset  . Arthritis Mother   . Hyperlipidemia Mother   . Hypertension Mother   . Stroke Mother   . Heart disease Mother   . Diabetes Mother   . Hyperlipidemia Father   . Heart disease Father  Allergies  Allergen Reactions  . Septra [Sulfamethoxazole-Trimethoprim] Rash    Current Outpatient Prescriptions on File Prior to Visit  Medication Sig Dispense Refill  . ALPRAZolam (XANAX) 0.5 MG tablet Take 1 tablet by mouth at bedtime as needed for insomnia. Take 1/2 tablet by mouth no more than twice daily as needed for anxiety. 60 tablet 0  . buPROPion (WELLBUTRIN XL) 150 MG 24 hr tablet Take 1 tablet (150 mg total) by mouth daily. 90 tablet 3  . cholecalciferol (VITAMIN D) 1000 UNITS tablet Take 1,000 Units by mouth daily.    Marland Kitchen DYMISTA 137-50 MCG/ACT SUSP PLACE 1 SPRAY INTO THE NOSE 2 TIMES DAILY. 23 g 3  . estradiol (VIVELLE-DOT) 0.05 MG/24HR patch Place 1 patch (0.05 mg total) onto the skin 2 (two) times a week. 24 patch 3  . eszopiclone (LUNESTA) 1 MG TABS tablet Take 1 tablet (1 mg total) by mouth at bedtime as needed for sleep. Take immediately before bedtime 90 tablet 0  .  hydrochlorothiazide (HYDRODIURIL) 12.5 MG tablet Take 12.5 mg by mouth daily. For meniere's    . meloxicam (MOBIC) 7.5 MG tablet Take 7.5 mg by mouth as needed for pain.    Marland Kitchen omeprazole (PRILOSEC) 40 MG capsule Take 1 capsule (40 mg total) by mouth daily. 90 capsule 3   No current facility-administered medications on file prior to visit.    BP 120/82 mmHg  Pulse 100  Temp(Src) 97.9 F (36.6 C) (Oral)  Ht 5\' 5"  (1.651 m)  Wt 156 lb 12.8 oz (71.124 kg)  BMI 26.09 kg/m2  SpO2 98%    Objective:   Physical Exam  Constitutional: She appears well-nourished.  Cardiovascular: Normal rate and regular rhythm.   Pulmonary/Chest: Effort normal and breath sounds normal.  Abdominal: Soft. Bowel sounds are normal. There is tenderness in the suprapubic area.  Genitourinary: Right adnexum displays no tenderness. Left adnexum displays no tenderness. No vaginal discharge found.  Rectocele noted, possible cystocele.   Skin: Skin is warm and dry.          Assessment & Plan:  Pelvic Pressure:  Present for the past 1 month, feels as though bladder has descended. Partial hysterectomy 15 years ago. Pelvic exam today with rectocele, likely cystocele as well.  UA: Negative. Wet prep sent off to rule out any other cause of pressure. Likely anatomical cause and discussed options for treatment. Will send to GYN for further evaluation.

## 2016-03-02 NOTE — Patient Instructions (Signed)
You will be contacted regarding your referral to GYN.  Please let us know if you have not heard back within one week.   I will notify your once I receive the results of your vaginal specimen.  It was a pleasure to see you today!

## 2016-03-02 NOTE — Progress Notes (Signed)
Pre visit review using our clinic review tool, if applicable. No additional management support is needed unless otherwise documented below in the visit note. 

## 2016-03-03 LAB — WET PREP BY MOLECULAR PROBE
Candida species: NEGATIVE
GARDNERELLA VAGINALIS: NEGATIVE
TRICHOMONAS VAG: NEGATIVE

## 2016-03-23 DIAGNOSIS — N8111 Cystocele, midline: Secondary | ICD-10-CM | POA: Diagnosis not present

## 2016-03-23 DIAGNOSIS — N8183 Incompetence or weakening of rectovaginal tissue: Secondary | ICD-10-CM | POA: Diagnosis not present

## 2016-03-23 DIAGNOSIS — R159 Full incontinence of feces: Secondary | ICD-10-CM | POA: Diagnosis not present

## 2016-03-29 ENCOUNTER — Encounter: Payer: Self-pay | Admitting: Primary Care

## 2016-03-29 ENCOUNTER — Other Ambulatory Visit: Payer: Self-pay | Admitting: Primary Care

## 2016-03-29 DIAGNOSIS — R102 Pelvic and perineal pain: Secondary | ICD-10-CM

## 2016-04-07 ENCOUNTER — Encounter: Payer: Self-pay | Admitting: Obstetrics and Gynecology

## 2016-05-24 ENCOUNTER — Other Ambulatory Visit: Payer: Self-pay | Admitting: Primary Care

## 2016-05-24 ENCOUNTER — Encounter: Payer: Self-pay | Admitting: Primary Care

## 2016-05-24 DIAGNOSIS — G4700 Insomnia, unspecified: Secondary | ICD-10-CM

## 2016-05-25 ENCOUNTER — Other Ambulatory Visit: Payer: Self-pay | Admitting: Occupational Therapy

## 2016-05-25 DIAGNOSIS — N9489 Other specified conditions associated with female genital organs and menstrual cycle: Secondary | ICD-10-CM

## 2016-05-25 DIAGNOSIS — N8185 Cervical stump prolapse: Secondary | ICD-10-CM

## 2016-05-25 DIAGNOSIS — R159 Full incontinence of feces: Secondary | ICD-10-CM

## 2016-05-25 DIAGNOSIS — K580 Irritable bowel syndrome with diarrhea: Secondary | ICD-10-CM

## 2016-05-25 DIAGNOSIS — N8111 Cystocele, midline: Secondary | ICD-10-CM | POA: Diagnosis not present

## 2016-05-25 DIAGNOSIS — R1030 Lower abdominal pain, unspecified: Secondary | ICD-10-CM | POA: Diagnosis not present

## 2016-05-25 NOTE — Telephone Encounter (Signed)
Ok to refill? Electronically refill request for   eszopiclone (LUNESTA) 1 MG TABS tablet  Last prescribed on 02/24/2016. Last seen on 03/02/2016.

## 2016-05-26 ENCOUNTER — Encounter: Payer: Self-pay | Admitting: Primary Care

## 2016-05-26 ENCOUNTER — Other Ambulatory Visit: Payer: Self-pay | Admitting: Primary Care

## 2016-05-26 DIAGNOSIS — N812 Incomplete uterovaginal prolapse: Secondary | ICD-10-CM

## 2016-05-26 DIAGNOSIS — N814 Uterovaginal prolapse, unspecified: Secondary | ICD-10-CM

## 2016-05-26 DIAGNOSIS — H6980 Other specified disorders of Eustachian tube, unspecified ear: Secondary | ICD-10-CM

## 2016-05-26 MED ORDER — ESZOPICLONE 1 MG PO TABS: 1.0000 mg | ORAL_TABLET | Freq: Every evening | ORAL | 0 refills | Status: DC | PRN

## 2016-05-26 MED ORDER — AZELASTINE-FLUTICASONE 137-50 MCG/ACT NA SUSP: NASAL | 5 refills | Status: DC

## 2016-05-26 MED ORDER — HYDROCHLOROTHIAZIDE 12.5 MG PO TABS: 12.5000 mg | ORAL_TABLET | Freq: Every day | ORAL | 3 refills | Status: DC

## 2016-05-26 NOTE — Telephone Encounter (Signed)
Called in medication to the pharmacy as instructed. 

## 2016-05-26 NOTE — Telephone Encounter (Signed)
Message left for patient to return my call.  

## 2016-05-29 ENCOUNTER — Ambulatory Visit
Admission: EM | Admit: 2016-05-29 | Discharge: 2016-05-29 | Disposition: A | Discharge status: Home / Self Care | Payer: 59 | Attending: Family Medicine | Admitting: Family Medicine

## 2016-05-29 ENCOUNTER — Encounter: Payer: Self-pay | Admitting: *Deleted

## 2016-05-29 DIAGNOSIS — J069 Acute upper respiratory infection, unspecified: Secondary | ICD-10-CM | POA: Diagnosis not present

## 2016-05-29 DIAGNOSIS — B9789 Other viral agents as the cause of diseases classified elsewhere: Principal | ICD-10-CM

## 2016-05-29 MED ORDER — GUAIFENESIN-CODEINE 100-10 MG/5ML PO SOLN: ORAL | 0 refills | Status: DC

## 2016-05-29 NOTE — ED Provider Notes (Signed)
[facility]    CSN: [id number] Arrival date & time: 05/29/16  0810  First Provider Contact:  None       History   Chief Complaint Chief Complaint  Patient presents with  . Cough  . Otalgia  . Sore Throat  . Headache  . Fever    HPI April Nguyen is a 65 y.o. female.   The history is provided by the patient.  URI  Presenting symptoms: congestion, cough and rhinorrhea   Severity:  Moderate Onset quality:  Sudden Duration:  2 days Timing:  Constant Progression:  Worsening Chronicity:  New Relieved by:  OTC medications (mild) Associated symptoms: no arthralgias, no headaches, no myalgias, no neck pain, no sinus pain, no sneezing, no swollen glands and no wheezing   Risk factors: sick contacts   Risk factors: not elderly, no chronic cardiac disease, no chronic kidney disease, no chronic respiratory disease, no diabetes mellitus, no immunosuppression, no recent illness and no recent travel     Past Medical History:  Diagnosis Date  . Allergy    Seasonal  . Arthritis    neck, hands, feet  . Depression   . Family history of adverse reaction to anesthesia    brother - PONV  . Genital warts    In past  . GERD (gastroesophageal reflux disease)   . Meniere's disease   . Migraines    sinus/stress  . PONV (postoperative nausea and vomiting)    and headaches  . Seizures ([place])    Dx 16 yrs ago - hormonal - none at least 3 yrs    Patient Active Problem List   Diagnosis Date Noted  . Preventative health care 02/11/2016  . Hyperlipidemia 02/11/2016  . Insomnia 01/22/2016  . Depression with anxiety 04/16/2015  . History of shingles 04/16/2015  . GERD (gastroesophageal reflux disease) 04/16/2015  . Arthritis 04/16/2015  . ETD (eustachian tube dysfunction) 04/16/2015    Past Surgical History:  Procedure Laterality Date  . ABDOMINAL HYSTERECTOMY    . CHOLECYSTECTOMY  2008  . dialation and cartarage  1982, 1999, 2000  . FINGER SURGERY  2011, 2012   Bone fusion of middle finger right and left hands  . FINGER SURGERY  2013   Pin removed from left middle finger  . GANGLION CYST EXCISION  2011, 2012   Left and right hands  . MYRINGOTOMY WITH TUBE PLACEMENT Bilateral 05/13/2015   Procedure: MYRINGOTOMY WITH  BUTTERFLY TUBE PLACEMENT;  Surgeon: Carloyn Manner, MD;  Location: [facility];  Service: ENT;  Laterality: Bilateral;  . PAROTID GLAND TUMOR EXCISION  1986  . TONSILLECTOMY AND ADENOIDECTOMY  1959  . TUBAL LIGATION  2010    OB History    No data available       Home Medications    Prior to Admission medications   Medication Sig Start Date End Date Taking? Authorizing Provider  ALPRAZolam Duanne Moron) 0.5 MG tablet Take 1 tablet by mouth at bedtime as needed for insomnia. Take 1/2 tablet by mouth no more than twice daily as needed for anxiety. 01/22/16  Yes Pleas Koch, NP  Azelastine-Fluticasone (DYMISTA) 137-50 MCG/ACT SUSP PLACE 1 SPRAY INTO THE NOSE 2 TIMES DAILY. 05/26/16  Yes Pleas Koch, NP  buPROPion (WELLBUTRIN XL) 150 MG 24 hr tablet Take 1 tablet (150 mg total) by mouth daily. 11/04/15  Yes Rubbie Battiest, NP  cholecalciferol (VITAMIN D) 1000 UNITS tablet Take 1,000 Units by mouth daily.   Yes Historical Provider,  MD  estradiol (VIVELLE-DOT) 0.05 MG/24HR patch Place 1 patch (0.05 mg total) onto the skin 2 (two) times a week. 11/04/15  Yes Rubbie Battiest, NP  eszopiclone (LUNESTA) 1 MG TABS tablet Take 1 tablet (1 mg total) by mouth at bedtime as needed for sleep. 05/26/16  Yes Pleas Koch, NP  hydrochlorothiazide (HYDRODIURIL) 12.5 MG tablet Take 1 tablet (12.5 mg total) by mouth daily. For meniere's 05/26/16  Yes Pleas Koch, NP  omeprazole (PRILOSEC) 40 MG capsule Take 1 capsule (40 mg total) by mouth daily. 11/04/15  Yes Rubbie Battiest, NP  guaiFENesin-codeine 100-10 MG/5ML syrup 10 ml po qhs prn 05/29/16   Norval Gable, MD  meloxicam (MOBIC) 7.5 MG tablet Take 7.5 mg by mouth as needed for pain.     Historical Provider, MD    Family History Family History  Problem Relation Age of Onset  . Arthritis Mother   . Hyperlipidemia Mother   . Hypertension Mother   . Stroke Mother   . Heart disease Mother   . Diabetes Mother   . Hyperlipidemia Father   . Heart disease Father     Social History Social History  Substance Use Topics  . Smoking status: Never Smoker  . Smokeless tobacco: Never Used  . Alcohol use No     Allergies   Septra [sulfamethoxazole-trimethoprim]   Review of Systems Review of Systems  HENT: Positive for congestion and rhinorrhea. Negative for sneezing.   Respiratory: Positive for cough. Negative for wheezing.   Musculoskeletal: Negative for arthralgias, myalgias and neck pain.  Neurological: Negative for headaches.     Physical Exam Triage Vital Signs ED Triage Vitals  Enc Vitals Group     BP 05/29/16 0824 121/67     Pulse Rate 05/29/16 0824 95     Resp 05/29/16 0824 16     Temp 05/29/16 0824 98.1 F (36.7 C)     Temp Source 05/29/16 0824 Oral     SpO2 05/29/16 0824 100 %     Weight 05/29/16 0826 155 lb (70.3 kg)     Height 05/29/16 0826 5\' 5"  (1.651 m)     Head Circumference --      Peak Flow --      Pain Score --      Pain Loc --      Pain Edu? --      Excl. in [place]? --    No data found.   Updated Vital Signs BP 121/67 (BP Location: Left Arm)   Pulse 95   Temp 98.1 F (36.7 C) (Oral)   Resp 16   Ht 5\' 5"  (1.651 m)   Wt 155 lb (70.3 kg)   SpO2 100%   BMI 25.79 kg/m   Visual Acuity Right Eye Distance:   Left Eye Distance:   Bilateral Distance:    Right Eye Near:   Left Eye Near:    Bilateral Near:     Physical Exam  Constitutional: She appears well-developed and well-nourished. No distress.  HENT:  Head: Normocephalic and atraumatic.  Right Ear: Tympanic membrane, external ear and ear canal normal.  Left Ear: Tympanic membrane, external ear and ear canal normal.  Nose: Rhinorrhea present. No mucosal edema, nose  lacerations, sinus tenderness, nasal deformity, septal deviation or nasal septal hematoma. No epistaxis.  No foreign bodies. Right sinus exhibits no maxillary sinus tenderness and no frontal sinus tenderness. Left sinus exhibits no maxillary sinus tenderness and no frontal sinus tenderness.  Mouth/Throat: Uvula is  midline, oropharynx is clear and moist and mucous membranes are normal. No oropharyngeal exudate.  Eyes: Conjunctivae and EOM are normal. Pupils are equal, round, and reactive to light. Right eye exhibits no discharge. Left eye exhibits no discharge. No scleral icterus.  Neck: Normal range of motion. Neck supple. No thyromegaly present.  Cardiovascular: Normal rate, regular rhythm and normal heart sounds.   Pulmonary/Chest: Effort normal and breath sounds normal. No respiratory distress. She has no wheezes. She has no rales.  Lymphadenopathy:    She has no cervical adenopathy.  Skin: She is not diaphoretic.  Nursing note and vitals reviewed.    UC Treatments / Results  Labs (all labs ordered are listed, but only abnormal results are displayed) Labs Reviewed - No data to display  EKG  EKG Interpretation None       Radiology No results found.  Procedures Procedures (including critical care time)  Medications Ordered in UC Medications - No data to display   Initial Impression / Assessment and Plan / UC Course  I have reviewed the triage vital signs and the nursing notes.  Pertinent labs & imaging results that were available during my care of the patient were reviewed by me and considered in my medical decision making (see chart for details).  Clinical Course      Final Clinical Impressions(s) / UC Diagnoses   Final diagnoses:  Viral URI with cough    New Prescriptions Discharge Medication List as of 05/29/2016  8:46 AM    START taking these medications   Details  guaiFENesin-codeine 100-10 MG/5ML syrup 10 ml po qhs prn, Print       1.diagnosis  reviewed with patient 2. rx as per orders above; reviewed possible side effects, interactions, risks and benefits  3. Recommend supportive treatment with increased fluids 4. Follow-up prn if symptoms worsen or don't improve   Norval Gable, MD 05/29/16 [phone]

## 2016-05-29 NOTE — ED Triage Notes (Signed)
Sore throat, non-productive cough, left earache, headache, onset yesterday.

## 2016-05-30 ENCOUNTER — Ambulatory Visit
Admission: RE | Admit: 2016-05-30 | Discharge: 2016-05-30 | Disposition: A | Discharge status: Home / Self Care | Payer: 59 | Source: Ambulatory Visit | Attending: Urology | Admitting: Urology

## 2016-05-30 DIAGNOSIS — N8111 Cystocele, midline: Secondary | ICD-10-CM

## 2016-05-30 DIAGNOSIS — R1030 Lower abdominal pain, unspecified: Secondary | ICD-10-CM

## 2016-05-30 DIAGNOSIS — K580 Irritable bowel syndrome with diarrhea: Secondary | ICD-10-CM

## 2016-05-30 DIAGNOSIS — R159 Full incontinence of feces: Secondary | ICD-10-CM

## 2016-05-30 DIAGNOSIS — N8185 Cervical stump prolapse: Secondary | ICD-10-CM

## 2016-05-30 DIAGNOSIS — N9489 Other specified conditions associated with female genital organs and menstrual cycle: Secondary | ICD-10-CM

## 2016-05-30 MED ORDER — IOPAMIDOL (ISOVUE-300) INJECTION 61%
100.0000 mL | Freq: Once | INTRAVENOUS | Status: AC | PRN
  Administered 2016-05-30: 100 mL via INTRAVENOUS

## 2016-05-31 ENCOUNTER — Other Ambulatory Visit: Payer: Self-pay | Admitting: *Deleted

## 2016-05-31 ENCOUNTER — Telehealth: Payer: Self-pay | Admitting: *Deleted

## 2016-05-31 NOTE — Patient Outreach (Signed)
[facility]) Care Management  05/31/2016  April Nguyen Apr 13, 1951 [id number]  RN introduce the [facility] program and services along with the purpose for today's call. RN spoke with pt on yesterday concerning her benefit exception. Pt states she will have a procedure done on Thursday at [facility] and was inquiring about coverage. RN has informed pt that she will incur a higher co-pay due to the network. Pt indicated she did not wish to have the higher co-payment and requested specific providers in the area that were within network. RN inquired with Systems analyst. RN spoke with pt today and explained that she can go anywhere her [facility] insurance is excepted and pay the speciality co-payment for services. If a procedure or test is recommended member needs to have this done on at a [facility] facility for the lower co-payment. If pt needs names of specific providers in network pt informed she can contact [facility] for more selection of providers. Pt vebralized an understanding with no additional request at this time.  Based upon pt's request for benefit exception and pending arrangements for an upcoming test RN will follow up next week and inquire if there is a benefit exception that remains pending. No other request at this time.   [name], RN Care Management Coordinator [place] Office [phone]

## 2016-05-31 NOTE — Telephone Encounter (Signed)
Called patient, no answer, left message on voicemail asking if the patient was improving and advising her to follow up with PCP or [facility] if symptoms become worse.

## 2016-06-06 ENCOUNTER — Other Ambulatory Visit: Payer: Self-pay | Admitting: *Deleted

## 2016-06-06 DIAGNOSIS — N8111 Cystocele, midline: Secondary | ICD-10-CM | POA: Diagnosis not present

## 2016-06-06 DIAGNOSIS — N393 Stress incontinence (female) (male): Secondary | ICD-10-CM | POA: Diagnosis not present

## 2016-06-06 DIAGNOSIS — N993 Prolapse of vaginal vault after hysterectomy: Secondary | ICD-10-CM | POA: Diagnosis not present

## 2016-06-06 NOTE — Patient Outreach (Signed)
[facility]) Care Management  06/06/2016  April Nguyen 05/19/1951 [id number]   RN attempted to reach pt today and inquire on her request based upon the information that was provided to pt on seeking an appropriate provider to address her medical issues. RN contact pt on both contact numbers however only able to leave a HIPAA approvded voice message to the home contact number. RN requested member to return the call to RN if additional interventions are needed.  [name], RN Care Management Coordinator [place] Office [phone]

## 2016-06-21 DIAGNOSIS — R194 Change in bowel habit: Secondary | ICD-10-CM | POA: Diagnosis not present

## 2016-06-21 DIAGNOSIS — K219 Gastro-esophageal reflux disease without esophagitis: Secondary | ICD-10-CM | POA: Diagnosis not present

## 2016-07-06 ENCOUNTER — Other Ambulatory Visit: Payer: Self-pay | Admitting: *Deleted

## 2016-07-06 NOTE — Patient Outreach (Signed)
[facility]) Care Management  07/06/2016  April Nguyen [birth year] [id number]   RN outreached to pt once again for update concerning the initial benefit exception however only able to leave a HIPAA approved voice message requesting a call back. Will inquire further if [facility] continues to be needed for ongoing services if not will officially close this case.   [name], RN Care Management Coordinator [place] Office [phone]

## 2016-07-08 ENCOUNTER — Other Ambulatory Visit: Payer: Self-pay | Admitting: *Deleted

## 2016-07-08 NOTE — Patient Outreach (Signed)
[facility]) Care Management  07/08/2016  April Nguyen [birth year] [id number]   RN spoke with pt today and verified no additional needs for [name] services concerning the recent benefit exception. Pt verified RN can close her case with no additional needs.   [name], RN Care Management Coordinator [place] Office [phone]

## 2016-07-13 DIAGNOSIS — Z01818 Encounter for other preprocedural examination: Secondary | ICD-10-CM | POA: Diagnosis not present

## 2016-07-25 DIAGNOSIS — G4700 Insomnia, unspecified: Secondary | ICD-10-CM | POA: Diagnosis not present

## 2016-07-25 DIAGNOSIS — N8185 Cervical stump prolapse: Secondary | ICD-10-CM | POA: Diagnosis not present

## 2016-07-25 DIAGNOSIS — R159 Full incontinence of feces: Secondary | ICD-10-CM | POA: Diagnosis not present

## 2016-07-25 DIAGNOSIS — K5902 Outlet dysfunction constipation: Secondary | ICD-10-CM | POA: Diagnosis not present

## 2016-07-25 DIAGNOSIS — N393 Stress incontinence (female) (male): Secondary | ICD-10-CM | POA: Diagnosis not present

## 2016-07-25 DIAGNOSIS — F418 Other specified anxiety disorders: Secondary | ICD-10-CM | POA: Diagnosis not present

## 2016-07-25 DIAGNOSIS — N993 Prolapse of vaginal vault after hysterectomy: Secondary | ICD-10-CM | POA: Diagnosis not present

## 2016-07-25 DIAGNOSIS — N8111 Cystocele, midline: Secondary | ICD-10-CM | POA: Diagnosis not present

## 2016-07-25 DIAGNOSIS — H8109 Meniere's disease, unspecified ear: Secondary | ICD-10-CM | POA: Diagnosis not present

## 2016-07-26 DIAGNOSIS — H8109 Meniere's disease, unspecified ear: Secondary | ICD-10-CM | POA: Diagnosis not present

## 2016-07-26 DIAGNOSIS — F418 Other specified anxiety disorders: Secondary | ICD-10-CM | POA: Diagnosis not present

## 2016-07-26 DIAGNOSIS — N993 Prolapse of vaginal vault after hysterectomy: Secondary | ICD-10-CM | POA: Diagnosis not present

## 2016-07-26 DIAGNOSIS — R159 Full incontinence of feces: Secondary | ICD-10-CM | POA: Diagnosis not present

## 2016-07-26 DIAGNOSIS — G4700 Insomnia, unspecified: Secondary | ICD-10-CM | POA: Diagnosis not present

## 2016-07-26 DIAGNOSIS — N393 Stress incontinence (female) (male): Secondary | ICD-10-CM | POA: Diagnosis not present

## 2016-07-27 DIAGNOSIS — G4700 Insomnia, unspecified: Secondary | ICD-10-CM | POA: Diagnosis not present

## 2016-07-27 DIAGNOSIS — H8109 Meniere's disease, unspecified ear: Secondary | ICD-10-CM | POA: Diagnosis not present

## 2016-07-27 DIAGNOSIS — F418 Other specified anxiety disorders: Secondary | ICD-10-CM | POA: Diagnosis not present

## 2016-07-27 DIAGNOSIS — R159 Full incontinence of feces: Secondary | ICD-10-CM | POA: Diagnosis not present

## 2016-07-27 DIAGNOSIS — N393 Stress incontinence (female) (male): Secondary | ICD-10-CM | POA: Diagnosis not present

## 2016-07-27 DIAGNOSIS — N993 Prolapse of vaginal vault after hysterectomy: Secondary | ICD-10-CM | POA: Diagnosis not present

## 2016-07-29 DIAGNOSIS — R339 Retention of urine, unspecified: Secondary | ICD-10-CM | POA: Diagnosis not present

## 2016-08-01 DIAGNOSIS — R339 Retention of urine, unspecified: Secondary | ICD-10-CM | POA: Diagnosis not present

## 2016-08-10 ENCOUNTER — Encounter: Payer: Self-pay | Admitting: Primary Care

## 2016-08-10 ENCOUNTER — Ambulatory Visit (INDEPENDENT_AMBULATORY_CARE_PROVIDER_SITE_OTHER): Payer: 59 | Admitting: Primary Care

## 2016-08-10 VITALS — BP 120/78 | HR 102 | Temp 98.5°F | Ht 65.0 in | Wt 151.0 lb

## 2016-08-10 DIAGNOSIS — G4700 Insomnia, unspecified: Secondary | ICD-10-CM

## 2016-08-10 DIAGNOSIS — F418 Other specified anxiety disorders: Secondary | ICD-10-CM

## 2016-08-10 DIAGNOSIS — I10 Essential (primary) hypertension: Secondary | ICD-10-CM

## 2016-08-10 MED ORDER — HYDROCHLOROTHIAZIDE 25 MG PO TABS: 25.0000 mg | ORAL_TABLET | Freq: Every day | ORAL | 2 refills | Status: DC

## 2016-08-10 MED ORDER — ESZOPICLONE 2 MG PO TABS: 2.0000 mg | ORAL_TABLET | Freq: Every evening | ORAL | 0 refills | Status: DC | PRN

## 2016-08-10 MED ORDER — ALPRAZOLAM 0.25 MG PO TABS: ORAL_TABLET | ORAL | 0 refills | Status: DC

## 2016-08-10 MED ORDER — BUPROPION HCL ER (XL) 150 MG PO TB24
150.0000 mg | ORAL_TABLET | Freq: Every day | ORAL | 3 refills | Status: DC
Start: 2016-08-10 — End: 2017-02-17
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
## 2016-08-10 NOTE — Patient Instructions (Addendum)
I sent refills to your pharmacy for hydrochlorothiazide 25 mg and Wellbutrin XL 150 mg.  Please call me if no improvement in insomnia with Lunesta 2 mg.   Please use the Alprazolam sparingly as discussed.  Follow up in June 2018 for your annual physical.  It was a pleasure to see you today!

## 2016-08-10 NOTE — Assessment & Plan Note (Signed)
Lunesta not as effective. Increase dose to 2 mg.  She will report if no improvement on new dose.

## 2016-08-10 NOTE — Progress Notes (Signed)
Pre visit review using our clinic review tool, if applicable. No additional management support is needed unless otherwise documented below in the visit note. 

## 2016-08-10 NOTE — Assessment & Plan Note (Signed)
Overall doing well on Wellbutrin XL. Using Alprazolam sparingly for panic attacks associated with car rides. Last refill in May 2017, is cutting them in half. Will prescribe 0.25 mg so that she doesn't haven't to cut tablets in half. Discussed to continue to use sparingly.

## 2016-08-11 DIAGNOSIS — H52223 Regular astigmatism, bilateral: Secondary | ICD-10-CM | POA: Diagnosis not present

## 2016-08-11 DIAGNOSIS — H524 Presbyopia: Secondary | ICD-10-CM | POA: Diagnosis not present

## 2016-08-11 DIAGNOSIS — H5201 Hypermetropia, right eye: Secondary | ICD-10-CM | POA: Diagnosis not present

## 2016-09-07 DIAGNOSIS — Z9889 Other specified postprocedural states: Secondary | ICD-10-CM | POA: Diagnosis not present

## 2016-09-07 DIAGNOSIS — R8290 Unspecified abnormal findings in urine: Secondary | ICD-10-CM | POA: Diagnosis not present

## 2016-10-20 ENCOUNTER — Other Ambulatory Visit: Payer: Self-pay | Admitting: Primary Care

## 2016-10-20 ENCOUNTER — Encounter: Payer: Self-pay | Admitting: Primary Care

## 2016-10-20 DIAGNOSIS — G4700 Insomnia, unspecified: Secondary | ICD-10-CM

## 2016-10-20 MED ORDER — ESZOPICLONE 2 MG PO TABS: 2.0000 mg | ORAL_TABLET | Freq: Every evening | ORAL | 0 refills | Status: DC | PRN

## 2016-10-20 NOTE — Telephone Encounter (Signed)
Left refill on voice mail at pharmacy  

## 2016-10-20 NOTE — Telephone Encounter (Signed)
Last filled 08-10-16 #30 Last OV 08-10-16 No Future OV

## 2016-10-21 ENCOUNTER — Other Ambulatory Visit: Payer: Self-pay | Admitting: Primary Care

## 2016-10-21 DIAGNOSIS — R232 Flushing: Secondary | ICD-10-CM

## 2016-10-21 MED ORDER — ESTRADIOL 0.05 MG/24HR TD PTTW: 1.0000 | MEDICATED_PATCH | TRANSDERMAL | 0 refills | Status: DC

## 2016-12-14 DIAGNOSIS — N952 Postmenopausal atrophic vaginitis: Secondary | ICD-10-CM | POA: Diagnosis not present

## 2016-12-14 DIAGNOSIS — N393 Stress incontinence (female) (male): Secondary | ICD-10-CM | POA: Diagnosis not present

## 2016-12-14 DIAGNOSIS — M791 Myalgia: Secondary | ICD-10-CM | POA: Diagnosis not present

## 2017-02-07 ENCOUNTER — Other Ambulatory Visit: Payer: Self-pay | Admitting: Primary Care

## 2017-02-07 DIAGNOSIS — E785 Hyperlipidemia, unspecified: Secondary | ICD-10-CM

## 2017-02-07 DIAGNOSIS — E559 Vitamin D deficiency, unspecified: Secondary | ICD-10-CM

## 2017-02-07 DIAGNOSIS — R7303 Prediabetes: Secondary | ICD-10-CM

## 2017-02-14 ENCOUNTER — Encounter: Payer: 59 | Admitting: Primary Care

## 2017-02-14 ENCOUNTER — Other Ambulatory Visit (INDEPENDENT_AMBULATORY_CARE_PROVIDER_SITE_OTHER): Payer: 59

## 2017-02-14 DIAGNOSIS — E559 Vitamin D deficiency, unspecified: Secondary | ICD-10-CM | POA: Diagnosis not present

## 2017-02-14 DIAGNOSIS — E785 Hyperlipidemia, unspecified: Secondary | ICD-10-CM

## 2017-02-14 DIAGNOSIS — R7303 Prediabetes: Secondary | ICD-10-CM

## 2017-02-14 LAB — LIPID PANEL
CHOL/HDL RATIO: 4
Cholesterol: 230 mg/dL — ABNORMAL HIGH (ref 0–200)
HDL: 55.2 mg/dL (ref >39.00)
LDL CALC: 156 mg/dL — AB (ref 0–99)
NonHDL: 174.51
TRIGLYCERIDES: 95 mg/dL (ref 0.0–149.0)
VLDL: 19 mg/dL (ref 0.0–40.0)

## 2017-02-14 LAB — COMPREHENSIVE METABOLIC PANEL
ALT: 17 U/L (ref 0–35)
AST: 17 U/L (ref 0–37)
Albumin: 4.2 g/dL (ref 3.5–5.2)
Alkaline Phosphatase: 57 U/L (ref 39–117)
BUN: 15 mg/dL (ref 6–23)
CALCIUM: 9.6 mg/dL (ref 8.4–10.5)
CHLORIDE: 104 meq/L (ref 96–112)
CO2: 33 meq/L — AB (ref 19–32)
Creatinine, Ser: 0.55 mg/dL (ref 0.40–1.20)
GFR: 118.43 mL/min (ref >60.00)
Glucose, Bld: 95 mg/dL (ref 70–99)
Potassium: 3.6 mEq/L (ref 3.5–5.1)
Sodium: 140 mEq/L (ref 135–145)
Total Bilirubin: 0.4 mg/dL (ref 0.2–1.2)
Total Protein: 7.4 g/dL (ref 6.0–8.3)

## 2017-02-14 LAB — HEMOGLOBIN A1C: Hgb A1c MFr Bld: 5.9 % (ref 4.6–6.5)

## 2017-02-14 LAB — VITAMIN D 25 HYDROXY (VIT D DEFICIENCY, FRACTURES): VITD: 47.21 ng/mL (ref 30.00–100.00)

## 2017-02-17 ENCOUNTER — Ambulatory Visit (INDEPENDENT_AMBULATORY_CARE_PROVIDER_SITE_OTHER): Payer: 59 | Admitting: Primary Care

## 2017-02-17 ENCOUNTER — Encounter: Payer: Self-pay | Admitting: Primary Care

## 2017-02-17 VITALS — BP 122/80 | HR 102 | Temp 98.1°F | Ht 65.0 in | Wt 153.8 lb

## 2017-02-17 DIAGNOSIS — M159 Polyosteoarthritis, unspecified: Secondary | ICD-10-CM | POA: Diagnosis not present

## 2017-02-17 DIAGNOSIS — F418 Other specified anxiety disorders: Secondary | ICD-10-CM | POA: Diagnosis not present

## 2017-02-17 DIAGNOSIS — B351 Tinea unguium: Secondary | ICD-10-CM

## 2017-02-17 DIAGNOSIS — Z0001 Encounter for general adult medical examination with abnormal findings: Secondary | ICD-10-CM | POA: Diagnosis not present

## 2017-02-17 DIAGNOSIS — K219 Gastro-esophageal reflux disease without esophagitis: Secondary | ICD-10-CM

## 2017-02-17 DIAGNOSIS — Z1239 Encounter for other screening for malignant neoplasm of breast: Secondary | ICD-10-CM

## 2017-02-17 DIAGNOSIS — Z1231 Encounter for screening mammogram for malignant neoplasm of breast: Secondary | ICD-10-CM | POA: Diagnosis not present

## 2017-02-17 DIAGNOSIS — G4700 Insomnia, unspecified: Secondary | ICD-10-CM

## 2017-02-17 DIAGNOSIS — M1990 Unspecified osteoarthritis, unspecified site: Secondary | ICD-10-CM | POA: Diagnosis not present

## 2017-02-17 DIAGNOSIS — E785 Hyperlipidemia, unspecified: Secondary | ICD-10-CM

## 2017-02-17 DIAGNOSIS — Z23 Encounter for immunization: Secondary | ICD-10-CM | POA: Diagnosis not present

## 2017-02-17 DIAGNOSIS — Z0000 Encounter for general adult medical examination without abnormal findings: Secondary | ICD-10-CM

## 2017-02-17 MED ORDER — ALPRAZOLAM 0.25 MG PO TABS: ORAL_TABLET | ORAL | 0 refills | Status: DC

## 2017-02-17 MED ORDER — TERBINAFINE HCL 250 MG PO TABS: 250.0000 mg | ORAL_TABLET | Freq: Every day | ORAL | 0 refills | Status: DC

## 2017-02-17 MED ORDER — OMEPRAZOLE 20 MG PO CPDR: 20.0000 mg | DELAYED_RELEASE_CAPSULE | Freq: Every day | ORAL | 0 refills | Status: DC

## 2017-02-17 MED ORDER — MELOXICAM 7.5 MG PO TABS: 7.5000 mg | ORAL_TABLET | ORAL | 0 refills | Status: DC | PRN

## 2017-02-17 NOTE — Addendum Note (Signed)
Addended by: Jacqualin Combes on: 02/17/2017 03:07 PM   Modules accepted: Orders

## 2017-02-17 NOTE — Assessment & Plan Note (Signed)
Will try to reduce down to 20 mg of omeprazole daily. She will update if her symptoms return.

## 2017-02-17 NOTE — Patient Instructions (Addendum)
Start oral terbinafine for fingernail fungus. Take 1 tablet by mouth once daily for 30 days. Take this with food.  I sent refills for Meloxicam to your pharmacy. We reduced your omeprazole to 20 mg. Please notify me if you notice that symptoms become worse.  Use the Xanax sparingly.  Start exercising. You should be getting 150 minutes of moderate intensity exercise weekly.  Continue your efforts towards a healthy diet. Increase vegetables, fruit, whole grains.  Ensure you are consuming 64 ounces of water daily.  Start taking Aspirin 81 mg once daily to protect your heart.  Schedule your mammogram.  You were provided with a tetanus vaccination today. This will cover you for 10 years.  Follow up in 1 year or sooner if needed.  It was a pleasure to see you today!

## 2017-02-17 NOTE — Assessment & Plan Note (Signed)
Stopped taking Lunesta in May 2018, doing well off of Lunesta.

## 2017-02-17 NOTE — Progress Notes (Signed)
Subjective:    Patient ID: April Nguyen, female    DOB: February 08, 1951, 66 y.o.   MRN: [id number]  HPI  April Nguyen is a 66 year old female who presents today for complete physical.  Immunizations: -Tetanus: Due today. Been over 10 years. -Influenza: Completed last season. -Shingles: Completed in 2017  Diet: She endorses a healthy diet Breakfast: Oatmeal Lunch: Vegetables, sandwich Dinner: Cereal, salad Snacks: Fruit, pretzels, peanut butter crackers Desserts: Once weekly. Beverages: Coffee, Coke Zero, water  Exercise: She does not currently exercise Eye exam: Completed in December 2017 Dental exam: Completes semi-annually Colonoscopy: Last completed in 2014, due now and is working on scheduling. Pap Smear: Hysterectomy Mammogram: Due, did not complete last season.  The 10-year ASCVD risk score April Bussing DC Brooke Bonito., et al., 2013) is: 6%   Values used to calculate the score:     Age: 58 years     Sex: Female     Is Non-Hispanic African American: No     Diabetic: No     Tobacco smoker: No     Systolic Blood Pressure: 470 mmHg     Is BP treated: Yes     HDL Cholesterol: 55.2 mg/dL     Total Cholesterol: 230 mg/dL     Review of Systems  Constitutional: Negative for unexpected weight change.  HENT: Negative for rhinorrhea.   Respiratory: Negative for cough and shortness of breath.   Cardiovascular: Negative for chest pain.  Gastrointestinal: Negative for constipation and diarrhea.  Genitourinary: Negative for difficulty urinating and menstrual problem.  Musculoskeletal: Positive for arthralgias. Negative for myalgias.       Joint pain to her neck, fingers, toes, knees. Symptoms are worse. She's taking Meloxicam with improvement.   Skin: Negative for rash.       Fingernail fungus that began 6 weeks ago. She's tried soaking her fingers, brushing her fingers with soap, and using OTC treatment with little improvement.  Allergic/Immunologic: Negative for environmental allergies.    Neurological: Negative for dizziness, numbness and headaches.  Psychiatric/Behavioral:       Uses Xanax just prior to long car trips.       Past Medical History:  Diagnosis Date  . Allergy    Seasonal  . Arthritis    neck, hands, feet  . Depression   . Family history of adverse reaction to anesthesia    brother - PONV  . Genital warts    In past  . GERD (gastroesophageal reflux disease)   . Meniere's disease   . Migraines    sinus/stress  . PONV (postoperative nausea and vomiting)    and headaches  . Seizures ([name])    Dx 16 yrs ago - hormonal - none at least 3 yrs     Social History   Social History  . Marital status: Married    Spouse name: N/A  . Number of children: N/A  . Years of education: N/A   Occupational History  . Not on file.   Social History Main Topics  . Smoking status: Never Smoker  . Smokeless tobacco: Never Used  . Alcohol use No  . Drug use: No  . Sexual activity: Yes    Partners: Male     Comment: Husband   Other Topics Concern  . Not on file   Social History Narrative   Work at the [employer] at [employer] with husband   Children- daughter and son    Grandchildren- 6    Caffeine-  1 cup in the morning, soda occasionally, green tea hot/cold   Enjoys reading, spending time at beach, spending time with family.    Past Surgical History:  Procedure Laterality Date  . ABDOMINAL HYSTERECTOMY    . CHOLECYSTECTOMY  2008  . dialation and cartarage  1982, 1999, 2000  . FINGER SURGERY  2011, 2012   Bone fusion of middle finger right and left hands  . FINGER SURGERY  2013   Pin removed from left middle finger  . GANGLION CYST EXCISION  2011, 2012   Left and right hands  . MYRINGOTOMY WITH TUBE PLACEMENT Bilateral 05/13/2015   Procedure: MYRINGOTOMY WITH  BUTTERFLY TUBE PLACEMENT;  Surgeon: Carloyn Manner, MD;  Location: [place];  Service: ENT;  Laterality: Bilateral;  . PAROTID GLAND TUMOR EXCISION  1986  .  TONSILLECTOMY AND ADENOIDECTOMY  1959  . TUBAL LIGATION  2010    Family History  Problem Relation Age of Onset  . Arthritis Mother   . Hyperlipidemia Mother   . Hypertension Mother   . Stroke Mother   . Heart disease Mother   . Diabetes Mother   . Hyperlipidemia Father   . Heart disease Father     Allergies  Allergen Reactions  . Septra [Sulfamethoxazole-Trimethoprim] Rash    Current Outpatient Prescriptions on File Prior to Visit  Medication Sig Dispense Refill  . Azelastine-Fluticasone (DYMISTA) 137-50 MCG/ACT SUSP PLACE 1 SPRAY INTO THE NOSE 2 TIMES DAILY. 23 g 5  . cholecalciferol (VITAMIN D) 1000 UNITS tablet Take 1,000 Units by mouth daily.    . hydrochlorothiazide (HYDRODIURIL) 25 MG tablet Take 1 tablet (25 mg total) by mouth daily. 90 tablet 2   No current facility-administered medications on file prior to visit.     BP 122/80   Pulse (!) 102   Temp 98.1 F (36.7 C) (Oral)   Ht 5\' 5"  (1.651 m)   Wt 153 lb 12.8 oz (69.8 kg)   SpO2 96%   BMI 25.59 kg/m    Objective:   Physical Exam  Constitutional: She is oriented to person, place, and time. She appears well-nourished.  HENT:  Right Ear: Tympanic membrane and ear canal normal.  Left Ear: Tympanic membrane and ear canal normal.  Nose: Nose normal.  Mouth/Throat: Oropharynx is clear and moist.  Eyes: Conjunctivae and EOM are normal. Pupils are equal, round, and reactive to light.  Neck: Neck supple. No thyromegaly present.  Cardiovascular: Normal rate and regular rhythm.   No murmur heard. Pulmonary/Chest: Effort normal and breath sounds normal. She has no rales.  Abdominal: Soft. Bowel sounds are normal. There is no tenderness.  Musculoskeletal: Normal range of motion.  Lymphadenopathy:    She has no cervical adenopathy.  Neurological: She is alert and oriented to person, place, and time. She has normal reflexes. No cranial nerve deficit.  Skin: Skin is warm and dry. No rash noted.  Fungal infection  noted under all fingernails bilaterally.  Psychiatric: She has a normal mood and affect.          Assessment & Plan:

## 2017-02-17 NOTE — Assessment & Plan Note (Signed)
Tetanus due, provided today. Shingles vaccination up-to-date. Discussed Shingrix which is currently out of stock, she will call in the Fall for an update on our supply. Discussed the importance of a healthy diet and regular exercise in order for weight loss, and to reduce the risk of other medical problems. Exam unremarkable, except for fungal infection to fingernails. Labs with hyperlipidemia, overall stable. Follow-up in one year for annual physical.

## 2017-02-17 NOTE — Assessment & Plan Note (Addendum)
Using Xanax sparingly before driving long distances. She will use this twice monthly on average. She is currently taking 1/2 tablet. Wellbutrin caused her to feel worse, so she weaned herself off several months ago. Overall doing well.

## 2017-02-17 NOTE — Assessment & Plan Note (Signed)
Total cholesterol and LDL are above goal today. ASCVD 10 year risk score 6%. Will have her work on exercise and continued improvements in her diet. Repeat lipids in one year. Start aspirin 81 mg once daily.

## 2017-02-17 NOTE — Assessment & Plan Note (Signed)
Generalized, current flare. Rx for Meloxicam sent to pharmacy.  Discussed non weight bearing exercise for treatment.

## 2017-04-06 ENCOUNTER — Encounter: Payer: Self-pay | Admitting: Primary Care

## 2017-04-06 DIAGNOSIS — H6980 Other specified disorders of Eustachian tube, unspecified ear: Secondary | ICD-10-CM

## 2017-04-06 MED ORDER — AZELASTINE-FLUTICASONE 137-50 MCG/ACT NA SUSP: NASAL | 5 refills | Status: DC

## 2017-05-10 ENCOUNTER — Encounter: Payer: Self-pay | Admitting: Primary Care

## 2017-05-10 ENCOUNTER — Other Ambulatory Visit: Payer: Self-pay | Admitting: Primary Care

## 2017-05-10 DIAGNOSIS — B351 Tinea unguium: Secondary | ICD-10-CM

## 2017-05-10 NOTE — Telephone Encounter (Signed)
Ok to refill? Electronically refill request for terbinafine (LAMISIL) 250 MG tablet  Last prescribed and seen on 02/17/2017.

## 2017-05-10 NOTE — Telephone Encounter (Signed)
Message left for patient to return my call.  

## 2017-05-10 NOTE — Telephone Encounter (Signed)
Please notify patient that I like to see her in the office for reevaluation of her fingernails. We will also need to check her liver function since she's been on the terbinafine. Please schedule an office visit her convenience.

## 2017-05-19 NOTE — Telephone Encounter (Signed)
Message left for patient to return my call.  

## 2017-05-26 NOTE — Telephone Encounter (Signed)
Send patient a message through MyChart. 

## 2017-06-30 ENCOUNTER — Other Ambulatory Visit: Payer: Self-pay | Admitting: Primary Care

## 2017-06-30 DIAGNOSIS — K219 Gastro-esophageal reflux disease without esophagitis: Secondary | ICD-10-CM

## 2017-06-30 MED ORDER — OMEPRAZOLE 20 MG PO CPDR: 20.0000 mg | DELAYED_RELEASE_CAPSULE | Freq: Every day | ORAL | 0 refills | Status: DC

## 2017-07-21 ENCOUNTER — Emergency Department: Payer: 59

## 2017-07-21 ENCOUNTER — Encounter: Payer: Self-pay | Admitting: Emergency Medicine

## 2017-07-21 ENCOUNTER — Emergency Department
Admission: EM | Admit: 2017-07-21 | Discharge: 2017-07-21 | Disposition: A | Discharge status: Home / Self Care | Payer: 59 | Attending: Emergency Medicine | Admitting: Emergency Medicine

## 2017-07-21 ENCOUNTER — Other Ambulatory Visit: Payer: Self-pay

## 2017-07-21 DIAGNOSIS — Z79899 Other long term (current) drug therapy: Secondary | ICD-10-CM | POA: Diagnosis not present

## 2017-07-21 DIAGNOSIS — R000 Tachycardia, unspecified: Secondary | ICD-10-CM | POA: Insufficient documentation

## 2017-07-21 DIAGNOSIS — R42 Dizziness and giddiness: Secondary | ICD-10-CM | POA: Diagnosis not present

## 2017-07-21 LAB — CBC WITH DIFFERENTIAL/PLATELET
Basophils Absolute: 0.1 10*3/uL (ref 0–0.1)
Basophils Relative: 1 %
EOS ABS: 0.2 10*3/uL (ref 0–0.7)
EOS PCT: 4 %
HCT: 41.3 % (ref 35.0–47.0)
Hemoglobin: 13.7 g/dL (ref 12.0–16.0)
LYMPHS ABS: 1.6 10*3/uL (ref 1.0–3.6)
Lymphocytes Relative: 28 %
MCH: 31.7 pg (ref 26.0–34.0)
MCHC: 33.2 g/dL (ref 32.0–36.0)
MCV: 95.3 fL (ref 80.0–100.0)
Monocytes Absolute: 0.5 10*3/uL (ref 0.2–0.9)
Monocytes Relative: 9 %
Neutro Abs: 3.3 10*3/uL (ref 1.4–6.5)
Neutrophils Relative %: 58 %
Platelets: 354 10*3/uL (ref 150–440)
RBC: 4.33 MIL/uL (ref 3.80–5.20)
RDW: 13.2 % (ref 11.5–14.5)
WBC: 5.7 10*3/uL (ref 3.6–11.0)

## 2017-07-21 LAB — URINALYSIS, COMPLETE (UACMP) WITH MICROSCOPIC
Bacteria, UA: NONE SEEN
Bilirubin Urine: NEGATIVE
GLUCOSE, UA: NEGATIVE mg/dL
HGB URINE DIPSTICK: NEGATIVE
Ketones, ur: NEGATIVE mg/dL
Leukocytes, UA: NEGATIVE
Nitrite: NEGATIVE
PH: 7 (ref 5.0–8.0)
Protein, ur: NEGATIVE mg/dL
RBC / HPF: NONE SEEN RBC/hpf (ref 0–5)
SPECIFIC GRAVITY, URINE: 1.002 — AB (ref 1.005–1.030)
WBC, UA: NONE SEEN WBC/hpf (ref 0–5)

## 2017-07-21 LAB — COMPREHENSIVE METABOLIC PANEL
ALK PHOS: 69 U/L (ref 38–126)
ALT: 22 U/L (ref 14–54)
AST: 21 U/L (ref 15–41)
Albumin: 4.2 g/dL (ref 3.5–5.0)
Anion gap: 9 (ref 5–15)
BILIRUBIN TOTAL: 0.5 mg/dL (ref 0.3–1.2)
BUN: 12 mg/dL (ref 6–20)
CHLORIDE: 104 mmol/L (ref 101–111)
CO2: 25 mmol/L (ref 22–32)
CREATININE: 0.5 mg/dL (ref 0.44–1.00)
Calcium: 9.1 mg/dL (ref 8.9–10.3)
GFR calc Af Amer: >60 mL/min (ref >60)
Glucose, Bld: 126 mg/dL — ABNORMAL HIGH (ref 65–99)
Potassium: 3.9 mmol/L (ref 3.5–5.1)
Sodium: 138 mmol/L (ref 135–145)
Total Protein: 7.5 g/dL (ref 6.5–8.1)

## 2017-07-21 LAB — TROPONIN I

## 2017-07-21 NOTE — ED Notes (Signed)
Pt was able to ambulate to bathroom and denies dizziness while doing so. Pt continues to report that she "just does not feel good" but does not have a more specific explanation, more so just a generalized feeling of malaise.

## 2017-07-21 NOTE — Discharge Instructions (Signed)
I am not sure exactly what caused the symptoms. The tests that done here are normal. Please follow-up with your regular doctor. Please return again if you have further symptoms like this in the future.

## 2017-07-21 NOTE — ED Notes (Signed)
Pt in NAD at time of discharge and was taken to car but was not removed from chart due to a delay in charting.

## 2017-07-21 NOTE — ED Notes (Signed)
MD at pts bedside to update pt. Pt and pt's family at bedside.

## 2017-07-21 NOTE — ED Triage Notes (Signed)
Pt states that while she was sitting at work she broke out into a sweat, started feeling really bad, checked her vital signs and noticed that her bp was elevated, pt states that she is dizzy and nauseated as well, pt has equal facial symetry

## 2017-07-21 NOTE — ED Provider Notes (Signed)
[facility] Emergency Department Provider Note   ____________________________________________   None    (approximate)  I have reviewed the triage vital signs and the nursing notes.   HISTORY  Chief Complaint Dizziness and Tachycardia    HPI April Nguyen is a 66 y.o. female Patient was at work sitting at her desk when she suddenly noticed she was getting very sweaty and her heart was racing. She had a blood pressure check and was going up. Normally she runs a blood pressure about 993 systolic. She denied any chest tightness or pressure or shortness of breath fever coughing or pain anywhere just a sweating dizziness and tachycardia. She has never had this before.   Past Medical History:  Diagnosis Date  . Allergy    Seasonal  . Arthritis    neck, hands, feet  . Depression   . Family history of adverse reaction to anesthesia    brother - PONV  . Genital warts    In past  . GERD (gastroesophageal reflux disease)   . Meniere's disease   . Migraines    sinus/stress  . PONV (postoperative nausea and vomiting)    and headaches  . Seizures ([place])    Dx 16 yrs ago - hormonal - none at least 3 yrs    Patient Active Problem List   Diagnosis Date Noted  . Preventative health care 02/11/2016  . Hyperlipidemia 02/11/2016  . Insomnia 01/22/2016  . Depression with anxiety 04/16/2015  . History of shingles 04/16/2015  . GERD (gastroesophageal reflux disease) 04/16/2015  . Arthritis 04/16/2015  . ETD (eustachian tube dysfunction) 04/16/2015    Past Surgical History:  Procedure Laterality Date  . ABDOMINAL HYSTERECTOMY    . CHOLECYSTECTOMY  2008  . dialation and cartarage  1982, 1999, 2000  . FINGER SURGERY  2011, 2012   Bone fusion of middle finger right and left hands  . FINGER SURGERY  2013   Pin removed from left middle finger  . GANGLION CYST EXCISION  2011, 2012   Left and right hands  . MYRINGOTOMY WITH  BUTTERFLY TUBE PLACEMENT  Bilateral 05/13/2015   Performed by Carloyn Manner, MD at [place]  . PAROTID GLAND TUMOR EXCISION  1986  . TONSILLECTOMY AND ADENOIDECTOMY  1959  . TUBAL LIGATION  2010    Prior to Admission medications   Medication Sig Start Date End Date Taking? Authorizing Provider  ALPRAZolam Duanne Moron) 0.25 MG tablet Take 1 tablet by mouth daily as needed for panic attacks. 02/17/17  Yes Pleas Koch, NP  Azelastine-Fluticasone (DYMISTA) 137-50 MCG/ACT SUSP PLACE 1 SPRAY INTO THE NOSE 2 TIMES DAILY. 04/06/17  Yes Pleas Koch, NP  hydrochlorothiazide (HYDRODIURIL) 25 MG tablet Take 1 tablet (25 mg total) by mouth daily. 08/10/16  Yes Pleas Koch, NP  meloxicam (MOBIC) 7.5 MG tablet Take 1 tablet (7.5 mg total) by mouth as needed for pain. 02/17/17  Yes Pleas Koch, NP  omeprazole (PRILOSEC) 20 MG capsule Take 1 capsule (20 mg total) by mouth daily. 06/30/17  Yes Pleas Koch, NP  PREMARIN vaginal cream Place 1 Applicatorful every other day vaginally.  12/14/16  Yes [provider]  terbinafine (LAMISIL) 250 MG tablet Take 1 tablet (250 mg total) by mouth daily. Patient not taking: Reported on 07/21/2017 02/17/17   Pleas Koch, NP    Allergies Septra [sulfamethoxazole-trimethoprim]  Family History  Problem Relation Age of Onset  . Arthritis Mother   . Hyperlipidemia  Mother   . Hypertension Mother   . Stroke Mother   . Heart disease Mother   . Diabetes Mother   . Hyperlipidemia Father   . Heart disease Father     Social History Social History   Tobacco Use  . Smoking status: Never Smoker  . Smokeless tobacco: Never Used  Substance Use Topics  . Alcohol use: No    Alcohol/week: 0.0 oz  . Drug use: No    Review of Systems  Constitutional: No fever/chills Eyes: No visual changes. ENT: No sore throat. Cardiovascular: Denies chest pain. Respiratory: Denies shortness of breath. Gastrointestinal: No abdominal pain.  No nausea, no  vomiting.  No diarrhea.  No constipation. Genitourinary: Negative for dysuria. Musculoskeletal: Negative for back pain. Skin: Negative for rash. Neurological: Negative for headaches, focal weakness  ____________________________________________   PHYSICAL EXAM:  VITAL SIGNS: ED Triage Vitals  Enc Vitals Group     BP 07/21/17 1719 (!) 155/83     Pulse Rate 07/21/17 1719 (!) 113     Resp 07/21/17 1719 18     Temp 07/21/17 1719 (!) 97.4 F (36.3 C)     Temp Source 07/21/17 1719 Oral     SpO2 07/21/17 1719 100 %     Weight 07/21/17 1720 155 lb (70.3 kg)     Height 07/21/17 1720 5\' 5"  (1.651 m)     Head Circumference --      Peak Flow --      Pain Score --      Pain Loc --      Pain Edu? --      Excl. in [place]? --     Constitutional: Alert and oriented. Well appearing and in no acute distress. Eyes: Conjunctivae are normal. PER. Head: Atraumatic. Nose: No congestion/rhinnorhea. Mouth/Throat: Mucous membranes are moist.  Oropharynx non-erythematous. Neck: No stridor.   Cardiovascular: rapid rate, regular rhythm. Grossly normal heart sounds.  Good peripheral circulation. Respiratory: Normal respiratory effort.  No retractions. Lungs CTAB. Gastrointestinal: Soft and nontender. No distention. No abdominal bruits. No CVA tenderness. Musculoskeletal: No lower extremity tenderness nor edema.  No joint effusions. Neurologic:  Normal speech and language. No gross focal neurologic deficits are appreciated.  Skin:  Skin is warm, dry and intact. No rash noted. Psychiatric: Mood and affect are normal. Speech and behavior are normal.  ____________________________________________   LABS (all labs ordered are listed, but only abnormal results are displayed)  Labs Reviewed  URINALYSIS, COMPLETE (UACMP) WITH MICROSCOPIC - Abnormal; Notable for the following components:      Result Value   Color, Urine COLORLESS (*)    APPearance CLEAR (*)    Specific Gravity, Urine 1.002 (*)    Squamous  Epithelial / LPF 0-5 (*)    All other components within normal limits  COMPREHENSIVE METABOLIC PANEL - Abnormal; Notable for the following components:   Glucose, Bld 126 (*)    All other components within normal limits  TROPONIN I  CBC WITH DIFFERENTIAL/PLATELET  TROPONIN I  CBG MONITORING, ED   ____________________________________________  EKG  EKG read and interpreted by me shows sinus tachycardia rate of 120 normal axis no acute ST-T changes ____________________________________________  RADIOLOGY  chest x-ray both looks normal and was read as normal ____________________________________________   PROCEDURES  Procedure(s) performed:   Procedures  Critical Care performed:  ____________________________________________   INITIAL IMPRESSION / ASSESSMENT AND PLAN / ED COURSE  As part of my medical decision making, I reviewed the following data within the electronic  MEDICAL RECORD NUMBER   patient's symptoms resolved completely while she is in the emergency room. Her labs and x-rays and EKG are all essentially normal she has no history that would explain the tachycardia hypertension and sweatiness. I will have her follow-up with her doctor and return here if she has the symptoms again if she continues to have the symptoms recur to evaluate her for a pheochromocytoma  ____________________________________________   FINAL CLINICAL IMPRESSION(S) / ED DIAGNOSES  Final diagnoses:  Tachycardia     ED Discharge Orders    None       Note:  This document was prepared using Dragon voice recognition software and may include unintentional dictation errors.    Nena Polio, MD 07/21/17 2055

## 2017-07-26 DIAGNOSIS — N952 Postmenopausal atrophic vaginitis: Secondary | ICD-10-CM | POA: Diagnosis not present

## 2017-07-26 DIAGNOSIS — F419 Anxiety disorder, unspecified: Secondary | ICD-10-CM | POA: Diagnosis not present

## 2017-07-26 DIAGNOSIS — R232 Flushing: Secondary | ICD-10-CM | POA: Diagnosis not present

## 2017-07-26 DIAGNOSIS — R4586 Emotional lability: Secondary | ICD-10-CM | POA: Diagnosis not present

## 2017-07-26 DIAGNOSIS — N951 Menopausal and female climacteric states: Secondary | ICD-10-CM | POA: Diagnosis not present

## 2017-07-26 DIAGNOSIS — N3941 Urge incontinence: Secondary | ICD-10-CM | POA: Diagnosis not present

## 2017-08-09 ENCOUNTER — Encounter: Payer: Self-pay | Admitting: Primary Care

## 2017-08-09 DIAGNOSIS — I10 Essential (primary) hypertension: Secondary | ICD-10-CM

## 2017-08-09 MED ORDER — HYDROCHLOROTHIAZIDE 25 MG PO TABS: 25.0000 mg | ORAL_TABLET | Freq: Every day | ORAL | 2 refills | Status: DC

## 2017-09-27 DIAGNOSIS — N3941 Urge incontinence: Secondary | ICD-10-CM | POA: Diagnosis not present

## 2017-09-28 ENCOUNTER — Other Ambulatory Visit: Payer: Self-pay | Admitting: Primary Care

## 2017-09-28 DIAGNOSIS — K219 Gastro-esophageal reflux disease without esophagitis: Secondary | ICD-10-CM

## 2017-09-28 MED ORDER — OMEPRAZOLE 20 MG PO CPDR: 20.0000 mg | DELAYED_RELEASE_CAPSULE | Freq: Every day | ORAL | 1 refills | Status: DC

## 2017-12-08 ENCOUNTER — Ambulatory Visit (INDEPENDENT_AMBULATORY_CARE_PROVIDER_SITE_OTHER): Payer: Self-pay | Admitting: Family Medicine

## 2017-12-08 VITALS — BP 138/90 | Temp 98.1°F | Wt 150.0 lb

## 2017-12-08 DIAGNOSIS — R42 Dizziness and giddiness: Secondary | ICD-10-CM

## 2017-12-08 DIAGNOSIS — Z8669 Personal history of other diseases of the nervous system and sense organs: Secondary | ICD-10-CM

## 2017-12-08 DIAGNOSIS — J302 Other seasonal allergic rhinitis: Secondary | ICD-10-CM

## 2017-12-08 MED ORDER — MECLIZINE HCL 25 MG PO TABS: 25.0000 mg | ORAL_TABLET | Freq: Three times a day (TID) | ORAL | 0 refills | Status: DC | PRN

## 2017-12-08 NOTE — Progress Notes (Signed)
April Nguyen is a 67 y.o. female who presents with concerns of mild sore throat, dry cough and left ear pain. She has a known history of meniere's disease, has tympanostomy tubes in place and is under the care of ENT. She also has allergic rhinitis with seasonal triggers and is on Zyrtec and Dymista for daily chronic management. She reports these medications work well and that she has been taking them both as prescribed. During interview she did responds that today she feels more dizzy that her baseline.  Review of Systems  Constitutional: Negative.   HENT: Positive for ear pain (left) and sore throat.   Eyes: Negative.   Respiratory: Positive for cough.   Cardiovascular: Negative.   Gastrointestinal: Negative.   Genitourinary: Negative.   Musculoskeletal: Negative.   Skin: Negative.   Neurological: Negative.   Endo/Heme/Allergies: Negative.   Psychiatric/Behavioral: Negative.      O: Vitals:   12/08/17 1218  BP: 138/90  Temp: 98.1 F (36.7 C)  SpO2: 100%   Physical Exam  Constitutional: She is oriented to person, place, and time. Vital signs are normal. She appears well-developed and well-nourished. She is active.  HENT:  Head: Normocephalic.  Right Ear: Hearing, external ear and ear canal normal.  Left Ear: Hearing, external ear and ear canal normal.  Nose: Rhinorrhea present. Right sinus exhibits no maxillary sinus tenderness and no frontal sinus tenderness. Left sinus exhibits no maxillary sinus tenderness and no frontal sinus tenderness.  Mouth/Throat: Oropharynx is clear and moist. No oropharyngeal exudate, posterior oropharyngeal edema, posterior oropharyngeal erythema or tonsillar abscesses. No tonsillar exudate.  Bilateral ears have tubes present left is in place and right appears to be coming out edge is closer to the ear canal entrance- there is obvious cerumen present around tube but no evidence of fluid of erythema to TM.   Eyes: Pupils are equal, round, and reactive  to light.  Neck: Normal range of motion. Neck supple.  Cardiovascular: Normal rate and regular rhythm.  Pulmonary/Chest: Effort normal and breath sounds normal.  Abdominal: Soft. Bowel sounds are normal.  Musculoskeletal: Normal range of motion.  Lymphadenopathy:    She has no cervical adenopathy.  Neurological: She is alert and oriented to person, place, and time.  Skin: Skin is warm and dry.    A: 1. Dizziness   2. Seasonal allergic rhinitis, unspecified trigger   3. History of Meniere's disease    P: Disease diagnosis and etiology discussed with patient and medications use and indications and common side effects reviewed. Patient verbalized understanding of information provided during time of visit and all questions and concerns addressed.  1. Dizziness Patient currently managed on HCTZ for meniere's treatment which is first line- supportive treatment can include various antiemetic medications- Meclizine is first line supportive medication- Advised to trial and follow up with ENT and PCP for care plan review  (per UTD)  - meclizine (ANTIVERT) 25 MG tablet; Take 1 tablet (25 mg total) by mouth 3 (three) times daily as needed for dizziness (take as needed not with other antihistimines).  2. Seasonal allergic rhinitis, unspecified trigger- Continue previously prescribed regimen for this condition. (Dymista/Zyrtec QD)  3. History of Meniere's disease - meclizine (ANTIVERT) 25 MG tablet; Take 1 tablet (25 mg total) by mouth 3 (three) times daily as needed for dizziness (take as needed not with other antihistimines).

## 2017-12-08 NOTE — Patient Instructions (Addendum)
Allergic Rhinitis, Adult Allergic rhinitis is an allergic reaction that affects the mucous membrane inside the nose. It causes sneezing, a runny or stuffy nose, and the feeling of mucus going down the back of the throat (postnasal drip). Allergic rhinitis can be mild to severe. There are two types of allergic rhinitis:  Seasonal. This type is also called hay fever. It happens only during certain seasons.  Perennial. This type can happen at any time of the year.  What are the causes? This condition happens when the body's defense system (immune system) responds to certain harmless substances called allergens as though they were germs.  Seasonal allergic rhinitis is triggered by pollen, which can come from grasses, trees, and weeds. Perennial allergic rhinitis may be caused by:  House dust mites.  Pet dander.  Mold spores.  What are the signs or symptoms? Symptoms of this condition include:  Sneezing.  Runny or stuffy nose (nasal congestion).  Postnasal drip.  Itchy nose.  Tearing of the eyes.  Trouble sleeping.  Daytime sleepiness.  How is this diagnosed? This condition may be diagnosed based on:  Your medical history.  A physical exam.  Tests to check for related conditions, such as: ? Asthma. ? Pink eye. ? Ear infection. ? Upper respiratory infection.  Tests to find out which allergens trigger your symptoms. These may include skin or blood tests.  How is this treated? There is no cure for this condition, but treatment can help control symptoms. Treatment may include:  Taking medicines that block allergy symptoms, such as antihistamines. Medicine may be given as a shot, nasal spray, or pill.  Avoiding the allergen.  Desensitization. This treatment involves getting ongoing shots until your body becomes less sensitive to the allergen. This treatment may be done if other treatments do not help.  If taking medicine and avoiding the allergen does not work, new,  stronger medicines may be prescribed.  Follow these instructions at home:  Find out what you are allergic to. Common allergens include smoke, dust, and pollen.  Avoid the things you are allergic to. These are some things you can do to help avoid allergens: ? Replace carpet with wood, tile, or vinyl flooring. Carpet can trap dander and dust. ? Do not smoke. Do not allow smoking in your home. ? Change your heating and air conditioning filter at least once a month. ? During allergy season:  Keep windows closed as much as possible.  Plan outdoor activities when pollen counts are lowest. This is usually during the evening hours.  When coming indoors, change clothing and shower before sitting on furniture or bedding.  Take over-the-counter and prescription medicines only as told by your health care provider.  Keep all follow-up visits as told by your health care provider. This is important. Contact a health care provider if:  You have a fever.  You develop a persistent cough.  You make whistling sounds when you breathe (you wheeze).  Your symptoms interfere with your normal daily activities. Get help right away if:  You have shortness of breath. Summary  This condition can be managed by taking medicines as directed and avoiding allergens.  Contact your health care provider if you develop a persistent cough or fever.  During allergy season, keep windows closed as much as possible. This information is not intended to replace advice given to you by your health care provider. Make sure you discuss any questions you have with your health care provider. Document Released: 05/17/2001 Document Revised: 09/29/2016  Document Reviewed: 09/29/2016 Elsevier Interactive Patient Education  2018 [employer]  Meniere Disease Meniere disease is an inner ear disorder. It causes attacks of a spinning sensation (vertigo), dizziness, and ringing in the ear (tinnitus). It also causes hearing loss  and a feeling of fullness or pressure in the ear. This is a lifelong condition, and it may get worse over time. You may have drop attacks or severe dizziness that makes you fall. A drop attack is when you suddenly fall without losing consciousness and you quickly recover after a few seconds or minutes. What are the causes? This condition is caused by having too much of the fluid that is in your inner ear (endolymph). When fluid builds up in your inner ear, it affects the nerves that control balance and hearing. The reason for the fluid buildup is not known. Possible causes include:  Allergies.  An abnormal reaction of the body's defense system (autoimmune disease).  Viral infection of the inner ear.  Head injury.  What increases the risk? You are more likely to develop this condition if:  You are older than age 13.  You have a family history of Meniere disease.  You have a history of autoimmune disease.  You have a history of migraine headaches.  What are the signs or symptoms? Symptoms of this condition can come and go and may last for up to 4 hours at a time. Symptoms usually start in one ear. They may become more frequent and eventually involve both ears. Symptoms can include:  Fullness and pressure in your ear.  Roaring or ringing in your ear.  Vertigo and loss of balance.  Dizziness.  Decreased hearing.  Nausea and vomiting.  How is this diagnosed? This condition is diagnosed based on:  A physical exam.  Tests , such as: ? A hearing test (audiogram). ? An electronystagmogram. This tests your balance nerve (vestibular nerve). ? Imaging studies of your inner ear, such as CT scan or MRI. ? Other balance tests, such as rotational or balance platform tests.  How is this treated? There is no cure for this condition, but treatment can help to manage your symptoms. Treatment may include:  A low-salt diet. Limiting salt may help to reduce fluid in the body and relieve  symptoms.  Oral or injected medicines to reduce or control: ? Vertigo. ? Nausea. ? Fluid retention. ? Dizziness.  Use of an air pressure pulse generator. This is a machine that sends small pressure pulses into your ear canal.  Hearing aids.  Inner ear surgery. This is rare.  When you have symptoms, it can be helpful to lie down on a flat surface and focus your eyes on one object that does not move. Try to stay in that position until your symptoms go away. Follow these instructions at home: Eating and drinking  Eat the same amount of food at the same time every day, including snacks.  Do not skip meals.  Avoid caffeine.  Drink enough fluids to keep your urine clear or pale yellow.  Limit alcoholic drinks to one drink a day for non-pregnant women and 2 drinks a day for men. One drink equals 12 oz of beer, 5 oz of wine, or 1 oz of hard liquor.  Limit the salt (sodium) in your diet as told by your health care provider. Check ingredients and nutrition facts on packaged foods and beverages.  Do not eat foods that contain monosodium glutamate (MSG). General instructions  Do not use any products  that contain nicotine or tobacco, such as cigarettes and e-cigarettes. If you need help quitting, ask your health care provider.  Take over-the-counter and prescription medicines only as told by your health care provider.  Find ways to reduce or avoid stress. If you need help with this, ask your health care provider.  Do not drive if you have vertigo or dizziness. Contact a health care provider if:  You have symptoms that last longer than 4 hours.  You have new or worse symptoms. Get help right away if:  You have been vomiting for 24 hours.  You cannot keep fluids down.  You have chest pain or trouble breathing. Summary  Meniere disease is an inner ear disorder. It causes attacks of a spinning sensation (vertigo), dizziness, and ringing in the ear (tinnitus). It also causes  hearing loss and a feeling of fullness or pressure in the ear.  Symptoms of this condition can come and go and may last for up to 4 hours at a time.  When you have symptoms, it can be helpful to lie down on a flat surface and focus your eyes on one object that does not move. Try to stay in that position until your symptoms go away. This information is not intended to replace advice given to you by your health care provider. Make sure you discuss any questions you have with your health care provider. Document Released: 08/19/2000 Document Revised: 07/13/2016 Document Reviewed: 07/13/2016 Elsevier Interactive Patient Education  2017 [employer].

## 2018-01-25 ENCOUNTER — Encounter: Payer: Self-pay | Admitting: Primary Care

## 2018-01-26 ENCOUNTER — Encounter: Payer: Self-pay | Admitting: Primary Care

## 2018-02-01 ENCOUNTER — Other Ambulatory Visit: Payer: Self-pay | Admitting: Primary Care

## 2018-02-01 DIAGNOSIS — H6980 Other specified disorders of Eustachian tube, unspecified ear: Secondary | ICD-10-CM

## 2018-02-06 ENCOUNTER — Other Ambulatory Visit: Payer: Self-pay | Admitting: Primary Care

## 2018-02-06 DIAGNOSIS — R7303 Prediabetes: Secondary | ICD-10-CM

## 2018-02-06 DIAGNOSIS — E7800 Pure hypercholesterolemia, unspecified: Secondary | ICD-10-CM

## 2018-02-14 DIAGNOSIS — B351 Tinea unguium: Secondary | ICD-10-CM | POA: Diagnosis not present

## 2018-02-14 DIAGNOSIS — L570 Actinic keratosis: Secondary | ICD-10-CM | POA: Diagnosis not present

## 2018-02-16 ENCOUNTER — Other Ambulatory Visit (INDEPENDENT_AMBULATORY_CARE_PROVIDER_SITE_OTHER): Payer: 59

## 2018-02-16 DIAGNOSIS — E7800 Pure hypercholesterolemia, unspecified: Secondary | ICD-10-CM

## 2018-02-16 DIAGNOSIS — R7303 Prediabetes: Secondary | ICD-10-CM | POA: Diagnosis not present

## 2018-02-16 LAB — LIPID PANEL
CHOL/HDL RATIO: 4
Cholesterol: 240 mg/dL — ABNORMAL HIGH (ref 0–200)
HDL: 65.8 mg/dL (ref >39.00)
LDL Cholesterol: 146 mg/dL — ABNORMAL HIGH (ref 0–99)
NONHDL: 174.29
Triglycerides: 143 mg/dL (ref 0.0–149.0)
VLDL: 28.6 mg/dL (ref 0.0–40.0)

## 2018-02-16 LAB — COMPREHENSIVE METABOLIC PANEL
ALT: 21 U/L (ref 0–35)
AST: 20 U/L (ref 0–37)
Albumin: 3.9 g/dL (ref 3.5–5.2)
Alkaline Phosphatase: 61 U/L (ref 39–117)
BILIRUBIN TOTAL: 0.4 mg/dL (ref 0.2–1.2)
BUN: 11 mg/dL (ref 6–23)
CHLORIDE: 102 meq/L (ref 96–112)
CO2: 32 meq/L (ref 19–32)
Calcium: 9.3 mg/dL (ref 8.4–10.5)
Creatinine, Ser: 0.52 mg/dL (ref 0.40–1.20)
GFR: 125.95 mL/min (ref >60.00)
GLUCOSE: 92 mg/dL (ref 70–99)
POTASSIUM: 4.1 meq/L (ref 3.5–5.1)
Sodium: 140 mEq/L (ref 135–145)
Total Protein: 7 g/dL (ref 6.0–8.3)

## 2018-02-16 LAB — HEMOGLOBIN A1C: HEMOGLOBIN A1C: 5.7 % (ref 4.6–6.5)

## 2018-02-22 ENCOUNTER — Encounter: Payer: Self-pay | Admitting: Primary Care

## 2018-02-22 ENCOUNTER — Ambulatory Visit (INDEPENDENT_AMBULATORY_CARE_PROVIDER_SITE_OTHER): Payer: 59 | Admitting: Primary Care

## 2018-02-22 VITALS — BP 136/86 | HR 100 | Temp 98.2°F | Ht 65.0 in | Wt 150.0 lb

## 2018-02-22 DIAGNOSIS — Z79890 Hormone replacement therapy (postmenopausal): Secondary | ICD-10-CM | POA: Diagnosis not present

## 2018-02-22 DIAGNOSIS — H6983 Other specified disorders of Eustachian tube, bilateral: Secondary | ICD-10-CM | POA: Diagnosis not present

## 2018-02-22 DIAGNOSIS — Z1239 Encounter for other screening for malignant neoplasm of breast: Secondary | ICD-10-CM

## 2018-02-22 DIAGNOSIS — Z0000 Encounter for general adult medical examination without abnormal findings: Secondary | ICD-10-CM

## 2018-02-22 DIAGNOSIS — E2839 Other primary ovarian failure: Secondary | ICD-10-CM

## 2018-02-22 DIAGNOSIS — H8109 Meniere's disease, unspecified ear: Secondary | ICD-10-CM | POA: Diagnosis not present

## 2018-02-22 DIAGNOSIS — K219 Gastro-esophageal reflux disease without esophagitis: Secondary | ICD-10-CM

## 2018-02-22 DIAGNOSIS — E785 Hyperlipidemia, unspecified: Secondary | ICD-10-CM

## 2018-02-22 DIAGNOSIS — M1990 Unspecified osteoarthritis, unspecified site: Secondary | ICD-10-CM

## 2018-02-22 DIAGNOSIS — F418 Other specified anxiety disorders: Secondary | ICD-10-CM

## 2018-02-22 DIAGNOSIS — Z1231 Encounter for screening mammogram for malignant neoplasm of breast: Secondary | ICD-10-CM

## 2018-02-22 MED ORDER — HYDROXYZINE HCL 10 MG PO TABS: ORAL_TABLET | ORAL | 0 refills | Status: DC

## 2018-02-22 NOTE — Assessment & Plan Note (Signed)
For vaginal atrophy. Continue Premarin cream and tablets. Following with GYN.

## 2018-02-22 NOTE — Assessment & Plan Note (Signed)
Doing well overall, continue HCTZ.

## 2018-02-22 NOTE — Assessment & Plan Note (Signed)
Managed on Meloxicam once every 2 weeks. Continue same.

## 2018-02-22 NOTE — Progress Notes (Signed)
Subjective:    Patient ID: April Nguyen, female    DOB: 23-May-1951, 67 y.o.   MRN: [id number]  HPI  April Nguyen is a 67 year old female who presents today for complete physical.  Immunizations: -Tetanus: Completed in 2018 -Influenza: Completed last season -Pneumonia: Due this Fall -Shingles: Completed in 2017  Diet: She endorses a healthy diet Breakfast: Oatmeal Lunch: Salad, vegetables, protein, cottage cheese, yogurt Dinner: Some meat, vegetable, salad,  Snacks: Fruit, pretzels, peanut butter crackers Desserts: Twice weekly   Beverages: Coffee  Exercise: She is not exercising Eye exam: Completed in 2018, due Dental exam: Completes semi-annually Colonoscopy: Completed in 2014, due and will schedule Dexa: Due Pap Smear: Hysterectomm Mammogram: Due  BP Readings from Last 3 Encounters:  02/22/18 136/86  12/08/17 138/90  07/21/17 (!) 130/96     Review of Systems  Constitutional: Negative for unexpected weight change.  HENT: Negative for rhinorrhea.   Respiratory: Negative for cough and shortness of breath.   Cardiovascular: Negative for chest pain.  Gastrointestinal: Negative for constipation and diarrhea.  Genitourinary: Negative for difficulty urinating and menstrual problem.  Musculoskeletal: Negative for arthralgias and myalgias.  Skin: Negative for rash.  Allergic/Immunologic: Negative for environmental allergies.  Neurological: Negative for dizziness, numbness and headaches.  Psychiatric/Behavioral:       Feels better on Cymbalta       Past Medical History:  Diagnosis Date  . Allergy    Seasonal  . Arthritis    neck, hands, feet  . Depression   . Family history of adverse reaction to anesthesia    brother - PONV  . Genital warts    In past  . GERD (gastroesophageal reflux disease)   . Meniere's disease   . Migraines    sinus/stress  . PONV (postoperative nausea and vomiting)    and headaches  . Seizures ([place])    Dx 16 yrs ago - hormonal -  none at least 3 yrs     Social History   Socioeconomic History  . Marital status: Married    Spouse name: Not on file  . Number of children: Not on file  . Years of education: Not on file  . Highest education level: Not on file  Occupational History  . Not on file  Social Needs  . Financial resource strain: Not on file  . Food insecurity:    Worry: Not on file    Inability: Not on file  . Transportation needs:    Medical: Not on file    Non-medical: Not on file  Tobacco Use  . Smoking status: Never Smoker  . Smokeless tobacco: Never Used  Substance and Sexual Activity  . Alcohol use: No    Alcohol/week: 0.0 oz  . Drug use: No  . Sexual activity: Yes    Partners: Male    Comment: Husband  Lifestyle  . Physical activity:    Days per week: Not on file    Minutes per session: Not on file  . Stress: Not on file  Relationships  . Social connections:    Talks on phone: Not on file    Gets together: Not on file    Attends religious service: Not on file    Active member of club or organization: Not on file    Attends meetings of clubs or organizations: Not on file    Relationship status: Not on file  . Intimate partner violence:    Fear of current or ex partner: Not on  file    Emotionally abused: Not on file    Physically abused: Not on file    Forced sexual activity: Not on file  Other Topics Concern  . Not on file  Social History Narrative   Work at the [employer] at [employer] with husband   Children- daughter and son    Grandchildren- 6    Caffeine- 1 cup in the morning, soda occasionally, green tea hot/cold   Enjoys reading, spending time at [employer], spending time with family.    Past Surgical History:  Procedure Laterality Date  . ABDOMINAL HYSTERECTOMY    . CHOLECYSTECTOMY  2008  . dialation and cartarage  1982, 1999, 2000  . FINGER SURGERY  2011, 2012   Bone fusion of middle finger right and left hands  . FINGER SURGERY  2013   Pin removed from  left middle finger  . GANGLION CYST EXCISION  2011, 2012   Left and right hands  . MYRINGOTOMY WITH TUBE PLACEMENT Bilateral 05/13/2015   Procedure: MYRINGOTOMY WITH  BUTTERFLY TUBE PLACEMENT;  Surgeon: Carloyn Manner, MD;  Location: [facility];  Service: ENT;  Laterality: Bilateral;  . PAROTID GLAND TUMOR EXCISION  1986  . TONSILLECTOMY AND ADENOIDECTOMY  1959  . TUBAL LIGATION  2010    Family History  Problem Relation Age of Onset  . Arthritis Mother   . Hyperlipidemia Mother   . Hypertension Mother   . Stroke Mother   . Heart disease Mother   . Diabetes Mother   . Hyperlipidemia Father   . Heart disease Father     Allergies  Allergen Reactions  . Septra [Sulfamethoxazole-Trimethoprim] Rash    Current Outpatient Medications on File Prior to Visit  Medication Sig Dispense Refill  . ALPRAZolam (XANAX) 0.25 MG tablet Take 1 tablet by mouth daily as needed for panic attacks. 20 tablet 0  . DULoxetine (CYMBALTA) 20 MG capsule Take 1 tablet nightly for 2 weeks, then increase to 1 tablet twice daily    . DYMISTA 137-50 MCG/ACT SUSP PLACE 1 SPRAY INTO THE NOSE 2 TIMES DAILY AS DIRECTED 23 g 5  . estrogens, conjugated, (PREMARIN) 0.3 MG tablet Take by mouth.    . hydrochlorothiazide (HYDRODIURIL) 25 MG tablet Take 1 tablet (25 mg total) by mouth daily. 90 tablet 2  . meclizine (ANTIVERT) 25 MG tablet Take 1 tablet (25 mg total) by mouth 3 (three) times daily as needed for dizziness (take as needed not with other antihistimines). 30 tablet 0  . meloxicam (MOBIC) 7.5 MG tablet Take 1 tablet (7.5 mg total) by mouth as needed for pain. 90 tablet 0  . omeprazole (PRILOSEC) 20 MG capsule Take 1 capsule (20 mg total) by mouth daily. 90 capsule 1  . PREMARIN vaginal cream Place 1 Applicatorful every other day vaginally.   3   No current facility-administered medications on file prior to visit.     BP 136/86   Pulse 100   Temp 98.2 F (36.8 C) (Oral)   Ht 5\' 5"  (1.651 m)   Wt  150 lb (68 kg)   SpO2 98%   BMI 24.96 kg/m    Objective:   Physical Exam  Constitutional: She is oriented to person, place, and time. She appears well-nourished.  HENT:  Mouth/Throat: No oropharyngeal exudate.  Eyes: Pupils are equal, round, and reactive to light. EOM are normal.  Neck: Neck supple. No thyromegaly present.  Cardiovascular: Normal rate and regular rhythm.  Respiratory: Effort normal and  breath sounds normal.  GI: Soft. Bowel sounds are normal. There is no tenderness.  Musculoskeletal: Normal range of motion.  Neurological: She is alert and oriented to person, place, and time.  Skin: Skin is warm and dry.  Psychiatric: She has a normal mood and affect.           Assessment & Plan:

## 2018-02-22 NOTE — Assessment & Plan Note (Signed)
Recent lipid panel improved from last 2 years.  Recommended regular exercise, continue to work on diet.  Repeat in 1 year.

## 2018-02-22 NOTE — Assessment & Plan Note (Signed)
Doing well on omeprazole 20 mg daily, feels well managed.

## 2018-02-22 NOTE — Assessment & Plan Note (Signed)
Immunizations UTD. Bone density and mammogram due, ordered. Colonoscopy due, she will call to schedule. Recommended to start regular exercise, work on diet. Exam unremarkable. Labs improved.  Follow up in 1 year for annual exam.

## 2018-02-22 NOTE — Assessment & Plan Note (Signed)
Doing well on Dymista, continue same.

## 2018-02-22 NOTE — Assessment & Plan Note (Signed)
Feels more anxious than depressed, panic attacks in the car with long travel.   Zoloft caused diarrhea, Lexapro caused weight gain, did well on Prozac but felt it blunted her emotions. Wellbutrin did well but not as much help for anxiety/panic attacks. Overall feeling well on Cymbalta 20 mg BID. Would like an alternative to alprazolam for long car rides.  Will continue Cymbalta 20 mg BID, try hydroxyzine PRN for long car rides.

## 2018-02-22 NOTE — Patient Instructions (Addendum)
Start exercising. You should be getting 150 minutes of moderate intensity exercise weekly.  Continue to work on [employer].  [facility] and [facility].  Call [facility] to schedule your colonoscopy.  Try the hydroxyzine as needed for anxiety/panic attacks. Take 1-2 tablets by mouth up to twice daily as needed. Please update me in a few weeks.  Continue Cymbalta 20 mg twice daily.   Follow up in 1 year for your annual exam or sooner if needed.  It was a pleasure to see you today!   Preventive Care 40-64 Years, Female Preventive care refers to lifestyle choices and visits with your health care provider that can promote health and wellness. What does preventive care include?  A yearly physical exam. This is also called an annual well check.  Dental exams once or twice a year.  Routine eye exams. Ask your health care provider how often you should have your eyes checked.  Personal lifestyle choices, including: ? Daily care of your teeth and gums. ? Regular physical activity. ? Eating a healthy diet. ? Avoiding tobacco and drug use. ? Limiting alcohol use. ? Practicing safe sex. ? Taking low-dose aspirin daily starting at age 49. ? Taking vitamin and mineral supplements as recommended by your health care provider. What happens during an annual well check? The services and screenings done by your health care provider during your annual well check will depend on your age, overall health, lifestyle risk factors, and family history of disease. Counseling Your health care provider may ask you questions about your:  Alcohol use.  Tobacco use.  Drug use.  Emotional well-being.  Home and relationship well-being.  Sexual activity.  Eating habits.  Work and work Statistician.  Method of birth control.  Menstrual cycle.  Pregnancy history.  Screening You may have the following tests or measurements:  Height,  weight, and BMI.  Blood pressure.  Lipid and cholesterol levels. These may be checked every 5 years, or more frequently if you are over 72 years old.  Skin check.  Lung cancer screening. You may have this screening every year starting at age 4 if you have a 30-pack-year history of smoking and currently smoke or have quit within the past 15 years.  Fecal occult blood test (FOBT) of the stool. You may have this test every year starting at age 34.  Flexible sigmoidoscopy or colonoscopy. You may have a sigmoidoscopy every 5 years or a colonoscopy every 10 years starting at age 23.  Hepatitis C blood test.  Hepatitis B blood test.  Sexually transmitted disease (STD) testing.  Diabetes screening. This is done by checking your blood sugar (glucose) after you have not eaten for a while (fasting). You may have this done every 1-3 years.  Mammogram. This may be done every 1-2 years. Talk to your health care provider about when you should start having regular mammograms. This may depend on whether you have a family history of breast cancer.  BRCA-related cancer screening. This may be done if you have a family history of breast, ovarian, tubal, or peritoneal cancers.  Pelvic exam and Pap test. This may be done every 3 years starting at age 30. Starting at age 55, this may be done every 5 years if you have a Pap test in combination with an HPV test.  Bone density scan. This is done to screen for osteoporosis. You may have this scan if you are at high risk for  osteoporosis.  Discuss your test results, treatment options, and if necessary, the need for more tests with your health care provider. Vaccines Your health care provider may recommend certain vaccines, such as:  Influenza vaccine. This is recommended every year.  Tetanus, diphtheria, and acellular pertussis (Tdap, Td) vaccine. You may need a Td booster every 10 years.  Varicella vaccine. You may need this if you have not been  vaccinated.  Zoster vaccine. You may need this after age 71.  Measles, mumps, and rubella (MMR) vaccine. You may need at least one dose of MMR if you were born in 1957 or later. You may also need a second dose.  Pneumococcal 13-valent conjugate (PCV13) vaccine. You may need this if you have certain conditions and were not previously vaccinated.  Pneumococcal polysaccharide (PPSV23) vaccine. You may need one or two doses if you smoke cigarettes or if you have certain conditions.  Meningococcal vaccine. You may need this if you have certain conditions.  Hepatitis A vaccine. You may need this if you have certain conditions or if you travel or work in places where you may be exposed to hepatitis A.  Hepatitis B vaccine. You may need this if you have certain conditions or if you travel or work in places where you may be exposed to hepatitis B.  Haemophilus influenzae type b (Hib) vaccine. You may need this if you have certain conditions.  Talk to your health care provider about which screenings and vaccines you need and how often you need them. This information is not intended to replace advice given to you by your health care provider. Make sure you discuss any questions you have with your health care provider. Document Released: 09/18/2015 Document Revised: 05/11/2016 Document Reviewed: 06/23/2015 Elsevier Interactive Patient Education  [employer].

## 2018-03-07 ENCOUNTER — Encounter: Payer: Self-pay | Admitting: Primary Care

## 2018-03-28 DIAGNOSIS — R232 Flushing: Secondary | ICD-10-CM | POA: Diagnosis not present

## 2018-03-28 DIAGNOSIS — N951 Menopausal and female climacteric states: Secondary | ICD-10-CM | POA: Diagnosis not present

## 2018-03-28 DIAGNOSIS — F419 Anxiety disorder, unspecified: Secondary | ICD-10-CM | POA: Diagnosis not present

## 2018-03-28 DIAGNOSIS — R4586 Emotional lability: Secondary | ICD-10-CM | POA: Diagnosis not present

## 2018-03-28 DIAGNOSIS — R3915 Urgency of urination: Secondary | ICD-10-CM | POA: Diagnosis not present

## 2018-03-30 ENCOUNTER — Encounter: Payer: Self-pay | Admitting: Primary Care

## 2018-03-30 ENCOUNTER — Telehealth: Payer: Self-pay

## 2018-03-30 DIAGNOSIS — K219 Gastro-esophageal reflux disease without esophagitis: Secondary | ICD-10-CM

## 2018-03-30 MED ORDER — OMEPRAZOLE 20 MG PO CPDR: 20.0000 mg | DELAYED_RELEASE_CAPSULE | Freq: Every day | ORAL | 1 refills | Status: DC

## 2018-03-30 NOTE — Telephone Encounter (Signed)
Received MyChart msg from pt requesting refill.  [See Pt email, 03/30/18.]  Rx sent and pt notified via [place].

## 2018-04-03 ENCOUNTER — Ambulatory Visit
Admission: RE | Admit: 2018-04-03 | Discharge: 2018-04-03 | Disposition: A | Discharge status: Home / Self Care | Payer: 59 | Source: Ambulatory Visit | Attending: Primary Care | Admitting: Primary Care

## 2018-04-03 DIAGNOSIS — E2839 Other primary ovarian failure: Secondary | ICD-10-CM | POA: Insufficient documentation

## 2018-04-03 DIAGNOSIS — Z1231 Encounter for screening mammogram for malignant neoplasm of breast: Secondary | ICD-10-CM | POA: Diagnosis not present

## 2018-04-03 DIAGNOSIS — Z1239 Encounter for other screening for malignant neoplasm of breast: Secondary | ICD-10-CM

## 2018-04-03 DIAGNOSIS — M85852 Other specified disorders of bone density and structure, left thigh: Secondary | ICD-10-CM | POA: Diagnosis not present

## 2018-04-03 DIAGNOSIS — Z780 Asymptomatic menopausal state: Secondary | ICD-10-CM | POA: Diagnosis not present

## 2018-05-31 ENCOUNTER — Other Ambulatory Visit: Payer: Self-pay | Admitting: Primary Care

## 2018-05-31 DIAGNOSIS — I10 Essential (primary) hypertension: Secondary | ICD-10-CM

## 2018-07-25 DIAGNOSIS — H524 Presbyopia: Secondary | ICD-10-CM | POA: Diagnosis not present

## 2018-07-25 DIAGNOSIS — H52223 Regular astigmatism, bilateral: Secondary | ICD-10-CM | POA: Diagnosis not present

## 2018-07-25 DIAGNOSIS — H5203 Hypermetropia, bilateral: Secondary | ICD-10-CM | POA: Diagnosis not present

## 2018-07-25 DIAGNOSIS — H2513 Age-related nuclear cataract, bilateral: Secondary | ICD-10-CM | POA: Diagnosis not present

## 2018-10-04 ENCOUNTER — Other Ambulatory Visit: Payer: Self-pay | Admitting: Primary Care

## 2018-10-04 DIAGNOSIS — K219 Gastro-esophageal reflux disease without esophagitis: Secondary | ICD-10-CM

## 2018-10-22 ENCOUNTER — Other Ambulatory Visit: Payer: Self-pay | Admitting: Primary Care

## 2018-10-22 ENCOUNTER — Other Ambulatory Visit: Payer: Self-pay

## 2018-10-22 DIAGNOSIS — H6980 Other specified disorders of Eustachian tube, unspecified ear: Secondary | ICD-10-CM

## 2018-10-22 DIAGNOSIS — F418 Other specified anxiety disorders: Secondary | ICD-10-CM

## 2018-10-22 MED ORDER — HYDROXYZINE HCL 10 MG PO TABS: ORAL_TABLET | ORAL | 0 refills | Status: DC

## 2018-10-22 NOTE — Telephone Encounter (Signed)
Last prescribed on 02/22/2018. Last office visit on 02/22/2018. Next future appointment on 02/26/2019

## 2018-10-22 NOTE — Telephone Encounter (Signed)
Noted, refill sent to pharmacy. 

## 2018-11-21 ENCOUNTER — Other Ambulatory Visit: Payer: Self-pay | Admitting: Primary Care

## 2018-11-21 DIAGNOSIS — I10 Essential (primary) hypertension: Secondary | ICD-10-CM

## 2018-12-18 MED FILL — PREMARIN VAGINAL CREAM-APPL: 0.625 | 30 days supply | Qty: 30 | Fill #0

## 2018-12-18 MED FILL — OMEPRAZOLE 20 MG CPDR: 20 | 90 days supply | Qty: 90 | Fill #0

## 2019-01-11 MED FILL — PREMARIN VAGINAL CREAM-APPL: 0.625 | 30 days supply | Qty: 30 | Fill #1

## 2019-02-15 ENCOUNTER — Other Ambulatory Visit: Payer: Self-pay | Admitting: Primary Care

## 2019-02-15 DIAGNOSIS — Z1159 Encounter for screening for other viral diseases: Secondary | ICD-10-CM

## 2019-02-15 DIAGNOSIS — R7303 Prediabetes: Secondary | ICD-10-CM

## 2019-02-15 DIAGNOSIS — E785 Hyperlipidemia, unspecified: Secondary | ICD-10-CM

## 2019-02-18 ENCOUNTER — Other Ambulatory Visit: Payer: Self-pay | Admitting: Primary Care

## 2019-02-18 ENCOUNTER — Telehealth: Payer: Self-pay

## 2019-02-18 NOTE — Telephone Encounter (Signed)
Left message to call clinic, needs COVID screen and back door lab info   

## 2019-02-19 ENCOUNTER — Other Ambulatory Visit (INDEPENDENT_AMBULATORY_CARE_PROVIDER_SITE_OTHER): Payer: 59

## 2019-02-19 ENCOUNTER — Other Ambulatory Visit: Payer: Self-pay

## 2019-02-19 ENCOUNTER — Other Ambulatory Visit: Payer: 59

## 2019-02-19 DIAGNOSIS — E785 Hyperlipidemia, unspecified: Secondary | ICD-10-CM | POA: Diagnosis not present

## 2019-02-19 DIAGNOSIS — Z1159 Encounter for screening for other viral diseases: Secondary | ICD-10-CM

## 2019-02-19 DIAGNOSIS — R7303 Prediabetes: Secondary | ICD-10-CM

## 2019-02-19 LAB — COMPREHENSIVE METABOLIC PANEL
ALT: 17 U/L (ref 0–35)
AST: 15 U/L (ref 0–37)
Albumin: 3.7 g/dL (ref 3.5–5.2)
Alkaline Phosphatase: 52 U/L (ref 39–117)
BUN: 10 mg/dL (ref 6–23)
CO2: 30 mEq/L (ref 19–32)
Calcium: 9.2 mg/dL (ref 8.4–10.5)
Chloride: 104 mEq/L (ref 96–112)
Creatinine, Ser: 0.51 mg/dL (ref 0.40–1.20)
GFR: 120.8 mL/min (ref >60.00)
Glucose, Bld: 90 mg/dL (ref 70–99)
Potassium: 4.2 mEq/L (ref 3.5–5.1)
Sodium: 139 mEq/L (ref 135–145)
Total Bilirubin: 0.4 mg/dL (ref 0.2–1.2)
Total Protein: 6.6 g/dL (ref 6.0–8.3)

## 2019-02-19 LAB — LIPID PANEL
Cholesterol: 215 mg/dL — ABNORMAL HIGH (ref 0–200)
HDL: 61 mg/dL (ref >39.00)
LDL Cholesterol: 131 mg/dL — ABNORMAL HIGH (ref 0–99)
NonHDL: 154.05
Total CHOL/HDL Ratio: 4
Triglycerides: 114 mg/dL (ref 0.0–149.0)
VLDL: 22.8 mg/dL (ref 0.0–40.0)

## 2019-02-19 LAB — HEMOGLOBIN A1C: Hgb A1c MFr Bld: 6.1 % (ref 4.6–6.5)

## 2019-02-20 LAB — HEPATITIS C ANTIBODY
Hepatitis C Ab: NONREACTIVE
SIGNAL TO CUT-OFF: 0.01 (ref <1.00)

## 2019-02-26 ENCOUNTER — Ambulatory Visit (INDEPENDENT_AMBULATORY_CARE_PROVIDER_SITE_OTHER): Payer: 59 | Admitting: Primary Care

## 2019-02-26 ENCOUNTER — Ambulatory Visit (INDEPENDENT_AMBULATORY_CARE_PROVIDER_SITE_OTHER)
Admission: RE | Admit: 2019-02-26 | Discharge: 2019-02-26 | Disposition: A | Discharge status: Home / Self Care | Payer: 59 | Source: Ambulatory Visit | Attending: Primary Care | Admitting: Primary Care

## 2019-02-26 ENCOUNTER — Other Ambulatory Visit: Payer: Self-pay

## 2019-02-26 ENCOUNTER — Encounter: Payer: Self-pay | Admitting: Primary Care

## 2019-02-26 VITALS — BP 120/82 | HR 85 | Temp 98.3°F | Ht 65.0 in | Wt 162.0 lb

## 2019-02-26 DIAGNOSIS — H8109 Meniere's disease, unspecified ear: Secondary | ICD-10-CM

## 2019-02-26 DIAGNOSIS — Z79890 Hormone replacement therapy (postmenopausal): Secondary | ICD-10-CM | POA: Diagnosis not present

## 2019-02-26 DIAGNOSIS — E785 Hyperlipidemia, unspecified: Secondary | ICD-10-CM

## 2019-02-26 DIAGNOSIS — G8929 Other chronic pain: Secondary | ICD-10-CM | POA: Diagnosis not present

## 2019-02-26 DIAGNOSIS — Z23 Encounter for immunization: Secondary | ICD-10-CM | POA: Diagnosis not present

## 2019-02-26 DIAGNOSIS — R7303 Prediabetes: Secondary | ICD-10-CM

## 2019-02-26 DIAGNOSIS — K219 Gastro-esophageal reflux disease without esophagitis: Secondary | ICD-10-CM | POA: Diagnosis not present

## 2019-02-26 DIAGNOSIS — Z8619 Personal history of other infectious and parasitic diseases: Secondary | ICD-10-CM

## 2019-02-26 DIAGNOSIS — R42 Dizziness and giddiness: Secondary | ICD-10-CM | POA: Diagnosis not present

## 2019-02-26 DIAGNOSIS — Z0000 Encounter for general adult medical examination without abnormal findings: Secondary | ICD-10-CM | POA: Diagnosis not present

## 2019-02-26 DIAGNOSIS — F418 Other specified anxiety disorders: Secondary | ICD-10-CM | POA: Diagnosis not present

## 2019-02-26 DIAGNOSIS — M1990 Unspecified osteoarthritis, unspecified site: Secondary | ICD-10-CM | POA: Diagnosis not present

## 2019-02-26 DIAGNOSIS — Z8669 Personal history of other diseases of the nervous system and sense organs: Secondary | ICD-10-CM | POA: Diagnosis not present

## 2019-02-26 DIAGNOSIS — Z0001 Encounter for general adult medical examination with abnormal findings: Secondary | ICD-10-CM

## 2019-02-26 DIAGNOSIS — M542 Cervicalgia: Secondary | ICD-10-CM

## 2019-02-26 DIAGNOSIS — Z1239 Encounter for other screening for malignant neoplasm of breast: Secondary | ICD-10-CM

## 2019-02-26 DIAGNOSIS — M159 Polyosteoarthritis, unspecified: Secondary | ICD-10-CM

## 2019-02-26 MED ORDER — MECLIZINE HCL 25 MG PO TABS: 25.0000 mg | ORAL_TABLET | Freq: Three times a day (TID) | ORAL | 0 refills | Status: DC | PRN

## 2019-02-26 MED ORDER — MELOXICAM 7.5 MG PO TABS: 7.5000 mg | ORAL_TABLET | ORAL | 0 refills | Status: DC | PRN

## 2019-02-26 MED ORDER — CYCLOBENZAPRINE HCL 5 MG PO TABS: 5.0000 mg | ORAL_TABLET | Freq: Every evening | ORAL | 0 refills | Status: DC | PRN

## 2019-02-26 MED ORDER — HYDROXYZINE HCL 10 MG PO TABS: ORAL_TABLET | ORAL | 0 refills | Status: DC

## 2019-02-26 MED ORDER — ZOSTER VAC RECOMB ADJUVANTED 50 MCG/0.5ML IM SUSR: 0.5000 mL | Freq: Once | INTRAMUSCULAR | 1 refills | Status: AC

## 2019-02-26 NOTE — Assessment & Plan Note (Signed)
Following with GYN, compliant to Premarin orally and vaginally. Continue same.

## 2019-02-26 NOTE — Assessment & Plan Note (Signed)
Increase in A1C from 5.7 in 2019 to 6.1 on recent labs.  Family history of diabetes in mother.   Discussed the importance of a healthy diet and regular exercise in order for weight loss, and to reduce the risk of any potential medical problems.  Repeat A1C in 6 months.

## 2019-02-26 NOTE — Progress Notes (Signed)
Subjective:    Patient ID: April Nguyen, female    DOB: [birth year], 68 y.o.   MRN: [id number]  HPI  April Nguyen is a 68 year old female who presents today for complete physical and to discuss chronic arthritis.  Chronic neck, hip, finger, and toe pain. Most bothersome site is her neck and left posterior upper back. She has difficulty sleeping at night and will often experience moderate to severe pain during the day. She takes Tylenol BID with little improvement, will occasionally take Meloxicam which will "take the edge off". She cannot tolerate Meloxicam often due to GI upset. She has not been on muscle relaxer treatment in years, also never completed physical therapy.   Immunizations: -Tetanus: Completed in 2018 -Influenza: Completes annually  -Pneumonia: Due today, never completed.  -Shingles: Completed Zostavax in 2017, interested in Shingrix.   Diet: She endorses a healthy diet. Eats a lot of fruit, vegetables, lean protein. No take out/fried food. She snacks on yogurt, peanut butter crackers, fruit. Desserts 1-2 times weekly Exercise: She is gardening, walking some. No regular exercise.   Eye exam: Completes regularly  Dental exam: Completes often. Colonoscopy: Completed in 2014, due in 2017 per patient. Pap Smear: Hysterectomy  Mammogram: Completed in July 2019 Dexa: Completed in July 2019, compliant to calcium and vitmamin D Hep C Screen: Negative    Review of Systems  Constitutional: Negative for unexpected weight change.  HENT: Negative for rhinorrhea.   Respiratory: Negative for cough and shortness of breath.   Cardiovascular: Negative for chest pain.  Gastrointestinal: Negative for constipation and diarrhea.  Genitourinary: Negative for difficulty urinating.  Musculoskeletal: Positive for arthralgias, neck pain and neck stiffness.  Skin: Negative for rash.  Allergic/Immunologic: Negative for environmental allergies.  Neurological: Negative for dizziness, numbness  and headaches.  Psychiatric/Behavioral:       Overall doing well off treatment. Uses hydroxyzine PRN during long car rides.       Past Medical History:  Diagnosis Date  . Allergy    Seasonal  . Arthritis    neck, hands, feet  . Depression   . Family history of adverse reaction to anesthesia    brother - PONV  . Genital warts    In past  . GERD (gastroesophageal reflux disease)   . Meniere's disease   . Migraines    sinus/stress  . PONV (postoperative nausea and vomiting)    and headaches  . Seizures ([place])    Dx 16 yrs ago - hormonal - none at least 3 yrs     Social History   Socioeconomic History  . Marital status: Married    Spouse name: Not on file  . Number of children: Not on file  . Years of education: Not on file  . Highest education level: Not on file  Occupational History  . Not on file  Social Needs  . Financial resource strain: Not on file  . Food insecurity    Worry: Not on file    Inability: Not on file  . Transportation needs    Medical: Not on file    Non-medical: Not on file  Tobacco Use  . Smoking status: Never Smoker  . Smokeless tobacco: Never Used  Substance and Sexual Activity  . Alcohol use: No    Alcohol/week: 0.0 standard drinks  . Drug use: No  . Sexual activity: Yes    Partners: Male    Comment: Husband  Lifestyle  . Physical activity    Days per  week: Not on file    Minutes per session: Not on file  . Stress: Not on file  Relationships  . Social Herbalist on phone: Not on file    Gets together: Not on file    Attends religious service: Not on file    Active member of club or organization: Not on file    Attends meetings of clubs or organizations: Not on file    Relationship status: Not on file  . Intimate partner violence    Fear of current or ex partner: Not on file    Emotionally abused: Not on file    Physically abused: Not on file    Forced sexual activity: Not on file  Other Topics Concern  . Not on  file  Social History Narrative   Work at the [employer] at [employer] with husband   Children- daughter and son    Grandchildren- 6    Caffeine- 1 cup in the morning, soda occasionally, green tea hot/cold   Enjoys reading, spending time at [employer], spending time with family.    Past Surgical History:  Procedure Laterality Date  . ABDOMINAL HYSTERECTOMY    . CHOLECYSTECTOMY  2008  . dialation and cartarage  1982, 1999, 2000  . FINGER SURGERY  2011, 2012   Bone fusion of middle finger right and left hands  . FINGER SURGERY  2013   Pin removed from left middle finger  . GANGLION CYST EXCISION  2011, 2012   Left and right hands  . MYRINGOTOMY WITH TUBE PLACEMENT Bilateral 05/13/2015   Procedure: MYRINGOTOMY WITH  BUTTERFLY TUBE PLACEMENT;  Surgeon: Carloyn Manner, MD;  Location: [facility];  Service: ENT;  Laterality: Bilateral;  . PAROTID GLAND TUMOR EXCISION  1986  . TONSILLECTOMY AND ADENOIDECTOMY  1959  . TUBAL LIGATION  2010    Family History  Problem Relation Age of Onset  . Arthritis Mother   . Hyperlipidemia Mother   . Hypertension Mother   . Stroke Mother   . Heart disease Mother   . Diabetes Mother   . Hyperlipidemia Father   . Heart disease Father   . Breast cancer Neg Hx     Allergies  Allergen Reactions  . Septra [Sulfamethoxazole-Trimethoprim] Rash    Current Outpatient Medications on File Prior to Visit  Medication Sig Dispense Refill  . DYMISTA 137-50 MCG/ACT SUSP PLACE 1 SPRAY INTO THE NOSE 2 TIMES DAILY AS DIRECTED 23 g 5  . estrogens, conjugated, (PREMARIN) 0.3 MG tablet Take by mouth.    . hydrochlorothiazide (HYDRODIURIL) 25 MG tablet TAKE 1 TABLET BY MOUTH DAILY 90 tablet 1  . hydrOXYzine (ATARAX/VISTARIL) 10 MG tablet Take 1-2 tablets by mouth twice daily as needed for long car rides. 30 tablet 0  . meclizine (ANTIVERT) 25 MG tablet Take 1 tablet (25 mg total) by mouth 3 (three) times daily as needed for dizziness (take as needed not  with other antihistimines). 30 tablet 0  . meloxicam (MOBIC) 7.5 MG tablet Take 1 tablet (7.5 mg total) by mouth as needed for pain. 90 tablet 0  . omeprazole (PRILOSEC) 20 MG capsule TAKE 1 CAPSULE (20 MG TOTAL) BY MOUTH DAILY. 90 capsule 1  . PREMARIN vaginal cream Place 1 Applicatorful every other day vaginally.   3   No current facility-administered medications on file prior to visit.     BP 120/82   Pulse 85   Temp 98.3 F (36.8 C) (Oral)  Ht 5\' 5"  (1.651 m)   Wt 162 lb (73.5 kg)   SpO2 99%   BMI 26.96 kg/m    Objective:   Physical Exam  Constitutional: She is oriented to person, place, and time. She appears well-nourished.  HENT:  Mouth/Throat: No oropharyngeal exudate.  Eyes: Pupils are equal, round, and reactive to light. EOM are normal.  Neck: Neck supple. Muscular tenderness present. No spinous process tenderness present.    Decrease in ROM with flexion, extension, lateral rotation. Worse on left than right. Obvious muscle tension noted to left posterior neck and shoulder.   Cardiovascular: Normal rate and regular rhythm.  Respiratory: Effort normal and breath sounds normal.  GI: Soft. Bowel sounds are normal. There is no abdominal tenderness.  Musculoskeletal:     Cervical back: She exhibits pain and spasm. She exhibits no bony tenderness.       Back:  Neurological: She is alert and oriented to person, place, and time.  Skin: Skin is warm and dry.  Psychiatric: She has a normal mood and affect.           Assessment & Plan:

## 2019-02-26 NOTE — Assessment & Plan Note (Addendum)
Not currently on treatment, overall doing well off of meds. Using hydroxyzine PRN during long car rides.  Continue to monitor.

## 2019-02-26 NOTE — Assessment & Plan Note (Signed)
Improved on recent labs. Encouraged a healthy diet, increase exercise regimen. Continue to monitor.

## 2019-02-26 NOTE — Assessment & Plan Note (Signed)
Compliant to omeprazole 20 mg daily, little breakthrough symptoms of GERD. Continue same.

## 2019-02-26 NOTE — Assessment & Plan Note (Addendum)
Chronic to neck, hips, toes, fingers. Worse to neck. Encouraged regular exercise, resume massages when possible.   Discussed use of Tylenol for arthritis type pain. She does use Meloxicam as needed with some improvement.   Check plain films of the neck today. Referral placed to PT for chronic neck pain and decrease in ROM.

## 2019-02-26 NOTE — Assessment & Plan Note (Addendum)
Chronic to neck, hips, toes, fingers. Worse to neck. Encouraged regular exercise, resume massages when possible.   Discussed use of Tylenol for arthritis type pain. She does use Meloxicam as needed with some improvement. Rx for Flexeril to use HS sent to pharmacy.  Check plain films of the neck today. Referral placed to PT for chronic neck pain and decrease in ROM.

## 2019-02-26 NOTE — Addendum Note (Signed)
Addended by: Jacqualin Combes on: 02/26/2019 02:27 PM   Modules accepted: Orders

## 2019-02-26 NOTE — Assessment & Plan Note (Signed)
Rx provided for Shingrix.

## 2019-02-26 NOTE — Patient Instructions (Addendum)
Increase activity and exercise. You should be getting 150 minutes of moderate intensity exercise weekly.  It's important to improve your diet by reducing consumption of fast food, fried food, processed snack foods, sugary drinks. Increase consumption of fresh vegetables and fruits, whole grains, water.  Ensure you are drinking 64 ounces of water daily.  Take the Shingles vaccination to the pharmacy in one month.  You will be contacted regarding your referral to physical therapy.  Please let us know if you have not been contacted within one week.   Complete xray(s) prior to leaving today. I will notify you of your results once received.  Schedule a lab only appointment for 6 months to repeat diabetes test.  It was a pleasure to see you today!   Preventive Care 66 Years and Older, Female Preventive care refers to lifestyle choices and visits with your health care provider that can promote health and wellness. What does preventive care include?  A yearly physical exam. This is also called an annual well check.  Dental exams once or twice a year.  Routine eye exams. Ask your health care provider how often you should have your eyes checked.  Personal lifestyle choices, including: ? Daily care of your teeth and gums. ? Regular physical activity. ? Eating a healthy diet. ? Avoiding tobacco and drug use. ? Limiting alcohol use. ? Practicing safe sex. ? Taking low-dose aspirin every day. ? Taking vitamin and mineral supplements as recommended by your health care provider. What happens during an annual well check? The services and screenings done by your health care provider during your annual well check will depend on your age, overall health, lifestyle risk factors, and family history of disease. Counseling Your health care provider may ask you questions about your:  Alcohol use.  Tobacco use.  Drug use.  Emotional well-being.  Home and relationship well-being.  Sexual  activity.  Eating habits.  History of falls.  Memory and ability to understand (cognition).  Work and work Statistician.  Reproductive health.  Screening You may have the following tests or measurements:  Height, weight, and BMI.  Blood pressure.  Lipid and cholesterol levels. These may be checked every 5 years, or more frequently if you are over 49 years old.  Skin check.  Lung cancer screening. You may have this screening every year starting at age 66 if you have a 30-pack-year history of smoking and currently smoke or have quit within the past 15 years.  Colorectal cancer screening. All adults should have this screening starting at age 66 and continuing until age 29. You will have tests every 1-10 years, depending on your results and the type of screening test. People at increased risk should start screening at an earlier age. Screening tests may include: ? Guaiac-based fecal occult blood testing. ? Fecal immunochemical test (FIT). ? Stool DNA test. ? Virtual colonoscopy. ? Sigmoidoscopy. During this test, a flexible tube with a tiny camera (sigmoidoscope) is used to examine your rectum and lower colon. The sigmoidoscope is inserted through your anus into your rectum and lower colon. ? Colonoscopy. During this test, a long, thin, flexible tube with a tiny camera (colonoscope) is used to examine your entire colon and rectum.  Hepatitis C blood test.  Hepatitis B blood test.  Sexually transmitted disease (STD) testing.  Diabetes screening. This is done by checking your blood sugar (glucose) after you have not eaten for a while (fasting). You may have this done every 1-3 years.  Bone density  scan. This is done to screen for osteoporosis. You may have this done starting at age 66.  Mammogram. This may be done every 1-2 years. Talk to your health care provider about how often you should have regular mammograms. Talk with your health care provider about your test results,  treatment options, and if necessary, the need for more tests. Vaccines Your health care provider may recommend certain vaccines, such as:  Influenza vaccine. This is recommended every year.  Tetanus, diphtheria, and acellular pertussis (Tdap, Td) vaccine. You may need a Td booster every 10 years.  Varicella vaccine. You may need this if you have not been vaccinated.  Zoster vaccine. You may need this after age 66.  Measles, mumps, and rubella (MMR) vaccine. You may need at least one dose of MMR if you were born in 1957 or later. You may also need a second dose.  Pneumococcal 13-valent conjugate (PCV13) vaccine. One dose is recommended after age 66.  Pneumococcal polysaccharide (PPSV23) vaccine. One dose is recommended after age 66.  Meningococcal vaccine. You may need this if you have certain conditions.  Hepatitis A vaccine. You may need this if you have certain conditions or if you travel or work in places where you may be exposed to hepatitis A.  Hepatitis B vaccine. You may need this if you have certain conditions or if you travel or work in places where you may be exposed to hepatitis B.  Haemophilus influenzae type b (Hib) vaccine. You may need this if you have certain conditions. Talk to your health care provider about which screenings and vaccines you need and how often you need them. This information is not intended to replace advice given to you by your health care provider. Make sure you discuss any questions you have with your health care provider. Document Released: 09/18/2015 Document Revised: 10/12/2017 Document Reviewed: 06/23/2015 Elsevier Interactive Patient Education  2019 [employer].

## 2019-02-26 NOTE — Assessment & Plan Note (Signed)
Pneumovax due, provided. Rx for Shingrix provided today. Tetanus UTD.  Mammogram due, orders placed. Bone density UTD. Colonoscopy due, she will call [facility] to schedule. Encouraged her to increase exercise, work on diet. Exam with findings as noted in HPI and PE. Labs reviewed. Follow up in 1 year for CPE.

## 2019-02-26 NOTE — Assessment & Plan Note (Signed)
Managed on HCTZ daily and Meclizine PRN for symptoms. Symptoms wax and wane during seasonal changes. Continue current regimen.

## 2019-03-12 ENCOUNTER — Other Ambulatory Visit: Payer: Self-pay | Admitting: Primary Care

## 2019-03-12 DIAGNOSIS — G8929 Other chronic pain: Secondary | ICD-10-CM

## 2019-03-12 DIAGNOSIS — K219 Gastro-esophageal reflux disease without esophagitis: Secondary | ICD-10-CM

## 2019-03-12 MED FILL — OMEPRAZOLE 20 MG CPDR: 20 | 90 days supply | Qty: 90 | Fill #0

## 2019-03-20 ENCOUNTER — Ambulatory Visit: Payer: 59 | Attending: Primary Care | Admitting: Physical Therapy

## 2019-03-20 ENCOUNTER — Other Ambulatory Visit: Payer: Self-pay

## 2019-03-20 ENCOUNTER — Encounter: Payer: Self-pay | Admitting: Physical Therapy

## 2019-03-20 DIAGNOSIS — M542 Cervicalgia: Secondary | ICD-10-CM | POA: Diagnosis not present

## 2019-03-20 DIAGNOSIS — M62838 Other muscle spasm: Secondary | ICD-10-CM
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
## 2019-03-25 ENCOUNTER — Encounter: Payer: 59 | Admitting: Physical Therapy

## 2019-03-27 ENCOUNTER — Ambulatory Visit: Payer: 59 | Admitting: Physical Therapy

## 2019-03-27 DIAGNOSIS — R4586 Emotional lability: Secondary | ICD-10-CM | POA: Diagnosis not present

## 2019-03-27 DIAGNOSIS — N951 Menopausal and female climacteric states: Secondary | ICD-10-CM | POA: Diagnosis not present

## 2019-03-27 DIAGNOSIS — R232 Flushing: Secondary | ICD-10-CM | POA: Diagnosis not present

## 2019-03-27 DIAGNOSIS — F419 Anxiety disorder, unspecified: Secondary | ICD-10-CM | POA: Diagnosis not present

## 2019-04-02 ENCOUNTER — Encounter: Payer: 59 | Admitting: Physical Therapy

## 2019-04-04 ENCOUNTER — Ambulatory Visit: Payer: 59 | Admitting: Physical Therapy

## 2019-04-08 ENCOUNTER — Encounter: Payer: 59 | Admitting: Physical Therapy

## 2019-04-09 ENCOUNTER — Encounter: Payer: Self-pay | Admitting: Physical Therapy

## 2019-04-09 ENCOUNTER — Ambulatory Visit: Payer: 59 | Attending: Internal Medicine | Admitting: Physical Therapy

## 2019-04-09 ENCOUNTER — Other Ambulatory Visit: Payer: Self-pay

## 2019-04-09 DIAGNOSIS — M542 Cervicalgia: Secondary | ICD-10-CM | POA: Diagnosis not present

## 2019-04-09 DIAGNOSIS — M62838 Other muscle spasm: Secondary | ICD-10-CM | POA: Diagnosis not present
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
## 2019-04-10 ENCOUNTER — Encounter: Payer: 59 | Admitting: Physical Therapy

## 2019-04-11 ENCOUNTER — Other Ambulatory Visit: Payer: Self-pay

## 2019-04-11 ENCOUNTER — Encounter: Payer: Self-pay | Admitting: Physical Therapy

## 2019-04-11 ENCOUNTER — Ambulatory Visit: Payer: 59 | Admitting: Physical Therapy

## 2019-04-11 DIAGNOSIS — M62838 Other muscle spasm: Secondary | ICD-10-CM

## 2019-04-11 DIAGNOSIS — M542 Cervicalgia: Secondary | ICD-10-CM | POA: Diagnosis not present
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
## 2019-04-15 ENCOUNTER — Encounter: Payer: 59 | Admitting: Physical Therapy

## 2019-04-16 ENCOUNTER — Ambulatory Visit: Payer: 59 | Admitting: Physical Therapy

## 2019-04-16 ENCOUNTER — Other Ambulatory Visit: Payer: Self-pay

## 2019-04-16 ENCOUNTER — Encounter: Payer: Self-pay | Admitting: Physical Therapy

## 2019-04-16 DIAGNOSIS — M542 Cervicalgia: Secondary | ICD-10-CM

## 2019-04-16 DIAGNOSIS — M62838 Other muscle spasm: Secondary | ICD-10-CM
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
## 2019-04-17 ENCOUNTER — Encounter: Payer: 59 | Admitting: Physical Therapy

## 2019-04-18 ENCOUNTER — Ambulatory Visit: Payer: 59 | Admitting: Physical Therapy

## 2019-04-22 ENCOUNTER — Encounter: Payer: 59 | Admitting: Physical Therapy

## 2019-04-23 ENCOUNTER — Encounter: Payer: Self-pay | Admitting: Physical Therapy

## 2019-04-23 ENCOUNTER — Other Ambulatory Visit: Payer: Self-pay

## 2019-04-23 ENCOUNTER — Ambulatory Visit: Payer: 59 | Admitting: Physical Therapy

## 2019-04-23 DIAGNOSIS — M542 Cervicalgia: Secondary | ICD-10-CM | POA: Diagnosis not present

## 2019-04-23 DIAGNOSIS — M62838 Other muscle spasm: Secondary | ICD-10-CM
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
## 2019-04-24 ENCOUNTER — Encounter: Payer: 59 | Admitting: Physical Therapy

## 2019-04-25 ENCOUNTER — Encounter: Payer: Self-pay | Admitting: Physical Therapy

## 2019-04-25 ENCOUNTER — Other Ambulatory Visit: Payer: Self-pay

## 2019-04-25 ENCOUNTER — Ambulatory Visit: Payer: 59 | Admitting: Physical Therapy

## 2019-04-25 DIAGNOSIS — M62838 Other muscle spasm: Secondary | ICD-10-CM

## 2019-04-25 DIAGNOSIS — M542 Cervicalgia: Secondary | ICD-10-CM
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
## 2019-04-29 ENCOUNTER — Encounter: Payer: 59 | Admitting: Physical Therapy

## 2019-04-30 ENCOUNTER — Ambulatory Visit: Payer: 59 | Admitting: Physical Therapy

## 2019-04-30 ENCOUNTER — Encounter: Payer: Self-pay | Admitting: Physical Therapy

## 2019-04-30 ENCOUNTER — Other Ambulatory Visit: Payer: Self-pay

## 2019-04-30 DIAGNOSIS — M542 Cervicalgia: Secondary | ICD-10-CM

## 2019-04-30 DIAGNOSIS — M62838 Other muscle spasm: Secondary | ICD-10-CM | POA: Diagnosis not present
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
## 2019-05-01 ENCOUNTER — Encounter: Payer: 59 | Admitting: Physical Therapy

## 2019-05-02 ENCOUNTER — Ambulatory Visit: Payer: 59 | Admitting: Physical Therapy

## 2019-05-02 ENCOUNTER — Encounter: Payer: Self-pay | Admitting: Physical Therapy

## 2019-05-02 ENCOUNTER — Other Ambulatory Visit: Payer: Self-pay

## 2019-05-02 DIAGNOSIS — M62838 Other muscle spasm: Secondary | ICD-10-CM | POA: Diagnosis not present

## 2019-05-02 DIAGNOSIS — M542 Cervicalgia: Secondary | ICD-10-CM | POA: Diagnosis not present
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
## 2019-05-03 ENCOUNTER — Encounter: Payer: Self-pay | Admitting: Physical Therapy

## 2019-05-06 ENCOUNTER — Encounter: Payer: 59 | Admitting: Physical Therapy

## 2019-05-07 ENCOUNTER — Encounter: Payer: Self-pay | Admitting: Physical Therapy

## 2019-05-07 ENCOUNTER — Ambulatory Visit: Payer: 59 | Attending: Primary Care | Admitting: Physical Therapy

## 2019-05-07 DIAGNOSIS — M542 Cervicalgia: Secondary | ICD-10-CM | POA: Diagnosis not present

## 2019-05-07 DIAGNOSIS — M62838 Other muscle spasm: Secondary | ICD-10-CM | POA: Insufficient documentation
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
## 2019-05-09 ENCOUNTER — Ambulatory Visit: Payer: 59 | Admitting: Physical Therapy

## 2019-05-14 ENCOUNTER — Encounter: Payer: Self-pay | Admitting: Physical Therapy

## 2019-05-14 ENCOUNTER — Ambulatory Visit: Payer: 59

## 2019-05-14 ENCOUNTER — Other Ambulatory Visit: Payer: Self-pay

## 2019-05-14 DIAGNOSIS — M62838 Other muscle spasm: Secondary | ICD-10-CM | POA: Diagnosis not present

## 2019-05-14 DIAGNOSIS — M542 Cervicalgia: Secondary | ICD-10-CM | POA: Diagnosis not present
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
## 2019-05-16 ENCOUNTER — Ambulatory Visit: Payer: 59 | Admitting: Physical Therapy

## 2019-05-21 ENCOUNTER — Ambulatory Visit: Payer: 59 | Admitting: Physical Therapy

## 2019-05-21 ENCOUNTER — Other Ambulatory Visit: Payer: Self-pay

## 2019-05-21 DIAGNOSIS — M62838 Other muscle spasm: Secondary | ICD-10-CM | POA: Diagnosis not present

## 2019-05-21 DIAGNOSIS — M542 Cervicalgia: Secondary | ICD-10-CM | POA: Diagnosis not present
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
## 2019-05-22 ENCOUNTER — Encounter: Payer: Self-pay | Admitting: Physical Therapy

## 2019-05-23 ENCOUNTER — Ambulatory Visit: Payer: 59 | Admitting: Physical Therapy

## 2019-05-23 ENCOUNTER — Other Ambulatory Visit: Payer: Self-pay

## 2019-05-23 ENCOUNTER — Encounter: Payer: Self-pay | Admitting: Physical Therapy

## 2019-05-23 DIAGNOSIS — M62838 Other muscle spasm: Secondary | ICD-10-CM | POA: Diagnosis not present

## 2019-05-23 DIAGNOSIS — M542 Cervicalgia: Secondary | ICD-10-CM | POA: Diagnosis not present
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
## 2019-05-27 ENCOUNTER — Ambulatory Visit: Payer: 59 | Admitting: Physical Therapy

## 2019-05-27 ENCOUNTER — Other Ambulatory Visit: Payer: Self-pay | Admitting: Primary Care

## 2019-05-27 DIAGNOSIS — M159 Polyosteoarthritis, unspecified: Secondary | ICD-10-CM

## 2019-06-03 ENCOUNTER — Ambulatory Visit: Payer: 59 | Admitting: Physical Therapy

## 2019-06-06 ENCOUNTER — Encounter: Payer: 59 | Admitting: Physical Therapy

## 2019-06-10 ENCOUNTER — Other Ambulatory Visit: Payer: Self-pay | Admitting: Primary Care

## 2019-06-10 ENCOUNTER — Encounter: Payer: Self-pay | Admitting: Physical Therapy

## 2019-06-10 DIAGNOSIS — M62838 Other muscle spasm: Secondary | ICD-10-CM

## 2019-06-10 DIAGNOSIS — I10 Essential (primary) hypertension: Secondary | ICD-10-CM

## 2019-06-10 DIAGNOSIS — M542 Cervicalgia: Secondary | ICD-10-CM
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
## 2019-06-17 ENCOUNTER — Encounter: Payer: 59 | Admitting: Physical Therapy

## 2019-06-19 ENCOUNTER — Encounter: Payer: 59 | Admitting: Physical Therapy

## 2019-06-26 ENCOUNTER — Encounter: Payer: 59 | Admitting: Physical Therapy

## 2019-07-01 ENCOUNTER — Encounter: Payer: 59 | Admitting: Physical Therapy

## 2019-07-01 ENCOUNTER — Telehealth: Payer: 59 | Admitting: Family

## 2019-07-01 DIAGNOSIS — J069 Acute upper respiratory infection, unspecified: Secondary | ICD-10-CM | POA: Diagnosis not present

## 2019-07-01 MED ORDER — CETIRIZINE HCL 10 MG PO TABS: 10.0000 mg | ORAL_TABLET | Freq: Every day | ORAL | 11 refills | Status: AC

## 2019-07-01 MED ORDER — FLUTICASONE PROPIONATE 50 MCG/ACT NA SUSP: 2.0000 | Freq: Every day | NASAL | 6 refills | Status: DC

## 2019-07-01 NOTE — Progress Notes (Signed)
We are sorry you are not feeling well.  Here is how we plan to help!  Based on what you have shared with me, it looks like you may have a viral upper respiratory infection.  Upper respiratory infections are caused by a large number of viruses; however, rhinovirus is the most common cause.   Symptoms vary from person to person, with common symptoms including sore throat, cough, fatigue or lack of energy and feeling of general discomfort.  A low-grade fever of up to 100.4 may present, but is often uncommon.  Symptoms vary however, and are closely related to a person's age or underlying illnesses.  The most common symptoms associated with an upper respiratory infection are nasal discharge or congestion, cough, sneezing, headache and pressure in the ears and face.  These symptoms usually persist for about 3 to 10 days, but can last up to 2 weeks.  It is important to know that upper respiratory infections do not cause serious illness or complications in most cases.    Upper respiratory infections can be transmitted from person to person, with the most common method of transmission being a person's hands.  The virus is able to live on the skin and can infect other persons for up to 2 hours after direct contact.  Also, these can be transmitted when someone coughs or sneezes; thus, it is important to cover the mouth to reduce this risk.  To keep the spread of the illness at [place], good hand hygiene is very important.  This is an infection that is most likely caused by a virus. There are no specific treatments other than to help you with the symptoms until the infection runs its course.  We are sorry you are not feeling well.  Here is how we plan to help!   For nasal congestion, you may use an oral decongestants such as Mucinex D or if you have glaucoma or high blood pressure use plain Mucinex.  Saline nasal spray or nasal drops can help and can safely be used as often as needed for congestion.  For your congestion,  I have prescribed Fluticasone nasal spray one spray in each nostril twice a day and zyrtec 10 mg daily.   If you do not have a history of heart disease, hypertension, diabetes or thyroid disease, prostate/bladder issues or glaucoma, you may also use Sudafed to treat nasal congestion.  It is highly recommended that you consult with a pharmacist or your primary care physician to ensure this medication is safe for you to take.     If you have a cough, you may use cough suppressants such as Delsym and Robitussin.  If you have glaucoma or high blood pressure, you can also use Coricidin HBP.     Given you current symptoms, you do need to be COVID tested. You can go to one of the testing sites listed below, while they are opened (see hours). You do not need an order and will stay in your car during the test. You do need to self isolate until your results return and if positive 10 days from when your symptoms started and until you are 3 days fever free.   Testing Locations (Monday - Friday, 8 a.m. - 3:30 p.m.) . [place]: [facility] at [facility], [address] [place], [place]: [place], [place], [place], [place] (entrance off [address])  . [facility]: (Closed each Monday): Testing site relocated to the  short stay covered drive at [facility]. (Use the [facility] entrance to [facility] next to [facility].)   If you have a sore or scratchy throat, use a saltwater gargle-  to  teaspoon of salt dissolved in a 4-ounce to 8-ounce glass of warm water.  Gargle the solution for approximately 15-30 seconds and then spit.  It is important not to swallow the solution.  You can also use throat lozenges/cough drops and Chloraseptic spray to help with throat pain or discomfort.  Warm or cold liquids can also be helpful in relieving throat pain.  For headache, pain or general discomfort, you can use Ibuprofen  or Tylenol as directed.   Some authorities believe that zinc sprays or the use of Echinacea may shorten the course of your symptoms.   HOME CARE . Only take medications as instructed by your medical team. . Be sure to drink plenty of fluids. Water is fine as well as fruit juices, sodas and electrolyte beverages. You may want to stay away from caffeine or alcohol. If you are nauseated, try taking small sips of liquids. How do you know if you are getting enough fluid? Your urine should be a pale yellow or almost colorless. . Get rest. . Taking a steamy shower or using a humidifier may help nasal congestion and ease sore throat pain. You can place a towel over your head and breathe in the steam from hot water coming from a faucet. . Using a saline nasal spray works much the same way. . Cough drops, hard candies and sore throat lozenges may ease your cough. . Avoid close contacts especially the very young and the elderly . Cover your mouth if you cough or sneeze . Always remember to wash your hands.   GET HELP RIGHT AWAY IF: . You develop worsening fever. . If your symptoms do not improve within 10 days . You develop yellow or green discharge from your nose over 3 days. . You have coughing fits . You develop a severe head ache or visual changes. . You develop shortness of breath, difficulty breathing or start having chest pain . Your symptoms persist after you have completed your treatment plan  MAKE SURE YOU   Understand these instructions.  Will watch your condition.  Will get help right away if you are not doing well or get worse.  Your e-visit answers were reviewed by a board certified advanced clinical practitioner to complete your personal care plan. Depending upon the condition, your plan could have included both over the counter or prescription medications. Please review your pharmacy choice. If there is a problem, you may call our nursing hot line at and have the prescription  routed to another pharmacy. Your safety is important to [place]. If you have drug allergies check your prescription carefully.   You can use MyChart to ask questions about today's visit, request a non-urgent call back, or ask for a work or school excuse for 24 hours related to this e-Visit. If it has been greater than 24 hours you will need to follow up with your provider, or enter a new e-Visit to address those concerns. You will get an e-mail in the next two days asking about your experience.  I hope that your e-visit has been valuable and will speed your recovery. Thank you for using e-visits.   Approximately 5 minutes was spent documenting and reviewing patient's chart.

## 2019-07-03 ENCOUNTER — Encounter: Payer: 59 | Admitting: Physical Therapy

## 2019-07-09 ENCOUNTER — Other Ambulatory Visit: Payer: Self-pay

## 2019-07-09 ENCOUNTER — Encounter: Payer: Self-pay | Admitting: Primary Care

## 2019-07-09 ENCOUNTER — Ambulatory Visit: Payer: 59 | Admitting: Primary Care

## 2019-07-09 DIAGNOSIS — H9203 Otalgia, bilateral: Secondary | ICD-10-CM

## 2019-07-09 DIAGNOSIS — H9209 Otalgia, unspecified ear: Secondary | ICD-10-CM | POA: Insufficient documentation

## 2019-07-09 NOTE — Assessment & Plan Note (Signed)
Acute for 2 weeks, has not tried anything OTC. Exam today with cerumen impaction. Patient consented to irrigation but was unable to tolerate shortly after initiation.  Discussed to use Debrox drops, Dymista. She will update.  No evidence of infection.

## 2019-07-09 NOTE — Patient Instructions (Signed)
Start using the Dymista or Flonase for ear pressure/pain.  You can use Debrox drops as needed to help with wax removal.  It was a pleasure to see you today!

## 2019-07-09 NOTE — Progress Notes (Signed)
Subjective:    Patient ID: April Nguyen, female    DOB: September 25, 1950, 68 y.o.   MRN: [id number]  HPI  April Nguyen is a 68 year old female with a history of meniere disease, eustachian tube dysfunction, GERD who presents today with a chief compliant of otalgia.  Her pain is located to both ears which began about two weeks ago, left worse than right. She was evaluated through an E-Visit on 07/01/19, diagnosed with viral URI and was told to use Flonase and Zyrtec.   Since her E-Visit she's not used the Flonase as she managed on Dymista. She's not used either Flonase or Dymista recently. She denies fevers, sore throat, cough.   Review of Systems  Constitutional: Negative for chills and fever.  HENT: Positive for ear pain and sinus pressure. Negative for sore throat.   Respiratory: Negative for cough.   Allergic/Immunologic: Positive for environmental allergies.       Past Medical History:  Diagnosis Date  . Allergy    Seasonal  . Arthritis    neck, hands, feet  . Depression   . Family history of adverse reaction to anesthesia    brother - PONV  . Genital warts    In past  . GERD (gastroesophageal reflux disease)   . Meniere's disease   . Migraines    sinus/stress  . PONV (postoperative nausea and vomiting)    and headaches  . Seizures ([place])    Dx 16 yrs ago - hormonal - none at least 3 yrs     Social History   Socioeconomic History  . Marital status: Married    Spouse name: Not on file  . Number of children: Not on file  . Years of education: Not on file  . Highest education level: Not on file  Occupational History  . Not on file  Social Needs  . Financial resource strain: Not on file  . Food insecurity    Worry: Not on file    Inability: Not on file  . Transportation needs    Medical: Not on file    Non-medical: Not on file  Tobacco Use  . Smoking status: Never Smoker  . Smokeless tobacco: Never Used  Substance and Sexual Activity  . Alcohol use: No   Alcohol/week: 0.0 standard drinks  . Drug use: No  . Sexual activity: Yes    Partners: Male    Comment: Husband  Lifestyle  . Physical activity    Days per week: Not on file    Minutes per session: Not on file  . Stress: Not on file  Relationships  . Social Herbalist on phone: Not on file    Gets together: Not on file    Attends religious service: Not on file    Active member of club or organization: Not on file    Attends meetings of clubs or organizations: Not on file    Relationship status: Not on file  . Intimate partner violence    Fear of current or ex partner: Not on file    Emotionally abused: Not on file    Physically abused: Not on file    Forced sexual activity: Not on file  Other Topics Concern  . Not on file  Social History Narrative   Work at the [employer] at [employer] with husband   Children- daughter and son    Grandchildren- 6    Caffeine- 1 cup in the morning, soda  occasionally, green tea hot/cold   Enjoys reading, spending time at beach, spending time with family.    Past Surgical History:  Procedure Laterality Date  . ABDOMINAL HYSTERECTOMY    . CHOLECYSTECTOMY  2008  . dialation and cartarage  1982, 1999, 2000  . FINGER SURGERY  2011, 2012   Bone fusion of middle finger right and left hands  . FINGER SURGERY  2013   Pin removed from left middle finger  . GANGLION CYST EXCISION  2011, 2012   Left and right hands  . MYRINGOTOMY WITH TUBE PLACEMENT Bilateral 05/13/2015   Procedure: MYRINGOTOMY WITH  BUTTERFLY TUBE PLACEMENT;  Surgeon: Carloyn Manner, MD;  Location: [facility];  Service: ENT;  Laterality: Bilateral;  . PAROTID GLAND TUMOR EXCISION  1986  . TONSILLECTOMY AND ADENOIDECTOMY  1959  . TUBAL LIGATION  2010    Family History  Problem Relation Age of Onset  . Arthritis Mother   . Hyperlipidemia Mother   . Hypertension Mother   . Stroke Mother   . Heart disease Mother   . Diabetes Mother   .  Hyperlipidemia Father   . Heart disease Father   . Breast cancer Neg Hx     Allergies  Allergen Reactions  . Septra [Sulfamethoxazole-Trimethoprim] Rash    Current Outpatient Medications on File Prior to Visit  Medication Sig Dispense Refill  . cetirizine (ZYRTEC) 10 MG tablet Take 1 tablet (10 mg total) by mouth daily. 30 tablet 11  . cyclobenzaprine (FLEXERIL) 5 MG tablet Take 1-2 tablets (5-10 mg total) by mouth at bedtime as needed for muscle spasms. 30 tablet 0  . DYMISTA 137-50 MCG/ACT SUSP PLACE 1 SPRAY INTO THE NOSE 2 TIMES DAILY AS DIRECTED 23 g 5  . estrogens, conjugated, (PREMARIN) 0.3 MG tablet Take by mouth.    . hydrochlorothiazide (HYDRODIURIL) 25 MG tablet TAKE 1 TABLET BY MOUTH DAILY 90 tablet 1  . hydrOXYzine (ATARAX/VISTARIL) 10 MG tablet Take 1-2 tablets by mouth twice daily as needed for long car rides. 30 tablet 0  . meclizine (ANTIVERT) 25 MG tablet Take 1 tablet (25 mg total) by mouth 3 (three) times daily as needed for dizziness (take as needed not with other antihistimines). 30 tablet 0  . meloxicam (MOBIC) 7.5 MG tablet TAKE 1 TABLET BY MOUTH AS NEEDED FOR PAIN. 90 tablet 0  . omeprazole (PRILOSEC) 20 MG capsule TAKE 1 CAPSULE BY MOUTH DAILY. 90 capsule 1  . PREMARIN vaginal cream Place 1 Applicatorful every other day vaginally.   3  . fluticasone (FLONASE) 50 MCG/ACT nasal spray Place 2 sprays into both nostrils daily. (Patient not taking: Reported on 07/09/2019) 16 g 6   No current facility-administered medications on file prior to visit.     BP 120/82   Pulse 72   Temp (!) 96.7 F (35.9 C) (Temporal)   Ht 5\' 5"  (1.651 m)   Wt 156 lb (70.8 kg)   SpO2 98%   BMI 25.96 kg/m    Objective:   Physical Exam  Constitutional: She appears well-nourished.  HENT:  Right Ear: Tympanic membrane is not erythematous.  Left Ear: Tympanic membrane is not erythematous.  Bilateral canals with tubes in place. Left canal with moderate cerumen impaction.    Cardiovascular: Normal rate and regular rhythm.  Respiratory: Effort normal and breath sounds normal.           Assessment & Plan:

## 2019-07-16 DIAGNOSIS — H9202 Otalgia, left ear: Secondary | ICD-10-CM | POA: Diagnosis not present

## 2019-07-16 DIAGNOSIS — H6983 Other specified disorders of Eustachian tube, bilateral: Secondary | ICD-10-CM | POA: Diagnosis not present

## 2019-07-16 DIAGNOSIS — M26622 Arthralgia of left temporomandibular joint: Secondary | ICD-10-CM | POA: Diagnosis not present

## 2019-07-16 DIAGNOSIS — H6121 Impacted cerumen, right ear: Secondary | ICD-10-CM | POA: Diagnosis not present

## 2019-07-30 DIAGNOSIS — H6983 Other specified disorders of Eustachian tube, bilateral: Secondary | ICD-10-CM | POA: Diagnosis not present

## 2019-08-19 DIAGNOSIS — H6123 Impacted cerumen, bilateral: Secondary | ICD-10-CM | POA: Diagnosis not present

## 2019-08-19 DIAGNOSIS — H6983 Other specified disorders of Eustachian tube, bilateral: Secondary | ICD-10-CM | POA: Diagnosis not present

## 2019-08-21 ENCOUNTER — Other Ambulatory Visit: Payer: Self-pay

## 2019-08-21 ENCOUNTER — Encounter: Payer: Self-pay | Admitting: Otolaryngology

## 2019-08-23 ENCOUNTER — Other Ambulatory Visit
Admission: RE | Admit: 2019-08-23 | Discharge: 2019-08-23 | Disposition: A | Discharge status: Home / Self Care | Payer: 59 | Source: Ambulatory Visit | Attending: Otolaryngology | Admitting: Otolaryngology

## 2019-08-23 ENCOUNTER — Other Ambulatory Visit: Payer: Self-pay | Admitting: Primary Care

## 2019-08-23 DIAGNOSIS — R7303 Prediabetes: Secondary | ICD-10-CM

## 2019-08-23 DIAGNOSIS — Z20828 Contact with and (suspected) exposure to other viral communicable diseases: Secondary | ICD-10-CM | POA: Insufficient documentation

## 2019-08-23 DIAGNOSIS — Z01812 Encounter for preprocedural laboratory examination: Secondary | ICD-10-CM | POA: Insufficient documentation

## 2019-08-24 LAB — SARS CORONAVIRUS 2 (TAT 6-24 HRS): SARS Coronavirus 2: NEGATIVE

## 2019-08-26 ENCOUNTER — Other Ambulatory Visit: Payer: 59

## 2019-08-26 ENCOUNTER — Other Ambulatory Visit: Payer: Self-pay | Admitting: Primary Care

## 2019-08-26 DIAGNOSIS — M159 Polyosteoarthritis, unspecified: Secondary | ICD-10-CM

## 2019-08-26 NOTE — Discharge Instructions (Signed)
[facility] DISCHARGE INSTRUCTIONS FOR MYRINGOTOMY AND TUBE INSERTION  [facility] Margaretha Sheffield, M.D. Roena Malady, M.D. Malon Kindle, M.D. Carloyn Manner, M.D.  Diet:   After surgery, the patient should take only liquids and foods as tolerated.  The patient may then have a regular diet after the effects of anesthesia have worn off, usually about four to six hours after surgery.  Activities:   The patient should rest until the effects of anesthesia have worn off.  After this, there are no restrictions on the normal daily activities.  Medications:   You will be given antibiotic drops to be used in the ears postoperatively.  It is recommended to use 4 drops 2 times a day for 4 days, then the drops should be saved for possible future use.  The tubes should not cause any discomfort to the patient, but if there is any question, Tylenol should be given according to the instructions for the age of the patient.  Other medications should be continued normally.  Precautions:   Should there be recurrent drainage after the tubes are placed, the drops should be used for approximately 4 days.  If it does not clear, you should call the ENT office.  Earplugs:   Earplugs are only needed for those who are going to be submerged under water.  When taking a bath or shower and using a cup or showerhead to rinse hair, it is not necessary to wear earplugs.  These come in a variety of fashions, all of which can be obtained at our office.  However, if one is not able to come by the office, then silicone plugs can be found at most pharmacies.  It is not advised to stick anything in the ear that is not approved as an earplug.  Silly putty is not to be used as an earplug.  Swimming is allowed in patients after ear tubes are inserted, however, they must wear earplugs if they are going to be submerged under water.  For those children who are going to be swimming a lot, it is  recommended to use a fitted ear mold, which can be made by our audiologist.  If discharge is noticed from the ears, this most likely represents an ear infection.  We would recommend getting your eardrops and using them as indicated above.  If it does not clear, then you should call the ENT office.  For follow up, the patient should return to the ENT office three weeks postoperatively and then every six months as required by the doctor. General Anesthesia, Adult, Care After This sheet gives you information about how to care for yourself after your procedure. Your health care provider may also give you more specific instructions. If you have problems or questions, contact your health care provider. What can I expect after the procedure? After the procedure, the following side effects are common:  Pain or discomfort at the IV site.  Nausea.  Vomiting.  Sore throat.  Trouble concentrating.  Feeling cold or chills.  Weak or tired.  Sleepiness and fatigue.  Soreness and body aches. These side effects can affect parts of the body that were not involved in surgery. Follow these instructions at home:  For at least 24 hours after the procedure:  Have a responsible adult stay with you. It is important to have someone help care for you until you are awake and alert.  Rest as needed.  Do not: ? Participate in activities in  which you could fall or become injured. ? Drive. ? Use heavy machinery. ? Drink alcohol. ? Take sleeping pills or medicines that cause drowsiness. ? Make important decisions or sign legal documents. ? Take care of children on your own. Eating and drinking  Follow any instructions from your health care provider about eating or drinking restrictions.  When you feel hungry, start by eating small amounts of foods that are soft and easy to digest (bland), such as toast. Gradually return to your regular diet.  Drink enough fluid to keep your urine pale yellow.  If you  vomit, rehydrate by drinking water, juice, or clear broth. General instructions  If you have sleep apnea, surgery and certain medicines can increase your risk for breathing problems. Follow instructions from your health care provider about wearing your sleep device: ? Anytime you are sleeping, including during daytime naps. ? While taking prescription pain medicines, sleeping medicines, or medicines that make you drowsy.  Return to your normal activities as told by your health care provider. Ask your health care provider what activities are safe for you.  Take over-the-counter and prescription medicines only as told by your health care provider.  If you smoke, do not smoke without supervision.  Keep all follow-up visits as told by your health care provider. This is important. Contact a health care provider if:  You have nausea or vomiting that does not get better with medicine.  You cannot eat or drink without vomiting.  You have pain that does not get better with medicine.  You are unable to pass urine.  You develop a skin rash.  You have a fever.  You have redness around your IV site that gets worse. Get help right away if:  You have difficulty breathing.  You have chest pain.  You have blood in your urine or stool, or you vomit blood. Summary  After the procedure, it is common to have a sore throat or nausea. It is also common to feel tired.  Have a responsible adult stay with you for the first 24 hours after general anesthesia. It is important to have someone help care for you until you are awake and alert.  When you feel hungry, start by eating small amounts of foods that are soft and easy to digest (bland), such as toast. Gradually return to your regular diet.  Drink enough fluid to keep your urine pale yellow.  Return to your normal activities as told by your health care provider. Ask your health care provider what activities are safe for you. This information is  not intended to replace advice given to you by your health care provider. Make sure you discuss any questions you have with your health care provider. Document Released: 11/28/2000 Document Revised: 08/25/2017 Document Reviewed: 04/07/2017 Elsevier Patient Education  2020 [employer].

## 2019-08-28 ENCOUNTER — Ambulatory Visit: Payer: 59 | Admitting: Anesthesiology

## 2019-08-28 ENCOUNTER — Encounter: Payer: Self-pay | Admitting: Otolaryngology

## 2019-08-28 ENCOUNTER — Other Ambulatory Visit: Payer: Self-pay

## 2019-08-28 ENCOUNTER — Encounter
Admission: RE | Disposition: A | Discharge status: Home / Self Care | Payer: Self-pay | Source: Home / Self Care | Attending: Otolaryngology

## 2019-08-28 ENCOUNTER — Ambulatory Visit
Admission: RE | Admit: 2019-08-28 | Discharge: 2019-08-28 | Disposition: A | Discharge status: Home / Self Care | Payer: 59 | Attending: Otolaryngology | Admitting: Otolaryngology

## 2019-08-28 DIAGNOSIS — K219 Gastro-esophageal reflux disease without esophagitis: Secondary | ICD-10-CM | POA: Insufficient documentation

## 2019-08-28 DIAGNOSIS — H900 Conductive hearing loss, bilateral: Secondary | ICD-10-CM | POA: Diagnosis not present

## 2019-08-28 DIAGNOSIS — H6980 Other specified disorders of Eustachian tube, unspecified ear: Secondary | ICD-10-CM | POA: Diagnosis present

## 2019-08-28 DIAGNOSIS — Z79899 Other long term (current) drug therapy: Secondary | ICD-10-CM | POA: Diagnosis not present

## 2019-08-28 DIAGNOSIS — Z791 Long term (current) use of non-steroidal anti-inflammatories (NSAID): Secondary | ICD-10-CM | POA: Insufficient documentation

## 2019-08-28 DIAGNOSIS — M1990 Unspecified osteoarthritis, unspecified site: Secondary | ICD-10-CM | POA: Insufficient documentation

## 2019-08-28 DIAGNOSIS — H6983 Other specified disorders of Eustachian tube, bilateral: Secondary | ICD-10-CM | POA: Diagnosis not present

## 2019-08-28 DIAGNOSIS — Z882 Allergy status to sulfonamides status: Secondary | ICD-10-CM | POA: Insufficient documentation

## 2019-08-28 HISTORY — DX: Unspecified eustachian tube disorder, unspecified ear: H69.90

## 2019-08-28 HISTORY — DX: Anxiety disorder, unspecified: F41.9

## 2019-08-28 HISTORY — PX: MYRINGOTOMY WITH TUBE PLACEMENT: SHX5663

## 2019-08-28 HISTORY — DX: Other specified disorders of Eustachian tube, unspecified ear: H69.80

## 2019-08-28 SURGERY — MYRINGOTOMY WITH TUBE PLACEMENT
Anesthesia: General | Site: Ear | Laterality: Bilateral

## 2019-08-28 MED ORDER — DEXAMETHASONE SODIUM PHOSPHATE 4 MG/ML IJ SOLN
INTRAMUSCULAR | Status: DC | PRN
  Administered 2019-08-28: 4 mg via INTRAVENOUS

## 2019-08-28 MED ORDER — CIPROFLOXACIN-DEXAMETHASONE 0.3-0.1 % OT SUSP
OTIC | Status: DC | PRN
  Administered 2019-08-28: 4 [drp] via OTIC

## 2019-08-28 MED ORDER — ACETAMINOPHEN 160 MG/5ML PO SOLN: 325.0000 mg | Freq: Once | ORAL | Status: DC

## 2019-08-28 MED ORDER — LIDOCAINE HCL (CARDIAC) PF 100 MG/5ML IV SOSY
PREFILLED_SYRINGE | INTRAVENOUS | Status: DC | PRN
  Administered 2019-08-28: 20 mg via INTRAVENOUS

## 2019-08-28 MED ORDER — OXYCODONE HCL 5 MG PO TABS
5.0000 mg | ORAL_TABLET | ORAL | Status: DC | PRN
  Administered 2019-08-28: 5 mg via ORAL

## 2019-08-28 MED ORDER — PROPOFOL 10 MG/ML IV BOLUS
INTRAVENOUS | Status: DC | PRN
  Administered 2019-08-28: 150 mg via INTRAVENOUS

## 2019-08-28 MED ORDER — ACETAMINOPHEN 325 MG PO TABS: 325.0000 mg | ORAL_TABLET | Freq: Once | ORAL | Status: DC

## 2019-08-28 MED ORDER — ONDANSETRON HCL 4 MG/2ML IJ SOLN
INTRAMUSCULAR | Status: DC | PRN
  Administered 2019-08-28: 4 mg via INTRAVENOUS

## 2019-08-28 MED ORDER — MIDAZOLAM HCL 2 MG/2ML IJ SOLN
INTRAMUSCULAR | Status: DC | PRN
  Administered 2019-08-28: 2 mg via INTRAVENOUS

## 2019-08-28 MED ORDER — LACTATED RINGERS IV SOLN
10.0000 mL/h | INTRAVENOUS | Status: DC
  Administered 2019-08-28: 10 mL/h via INTRAVENOUS

## 2019-08-28 MED ORDER — HYDROCODONE-ACETAMINOPHEN 5-325 MG PO TABS: 1.0000 | ORAL_TABLET | ORAL | 0 refills | Status: AC | PRN

## 2019-08-28 MED ORDER — ONDANSETRON HCL 4 MG PO TABS: 4.0000 mg | ORAL_TABLET | Freq: Three times a day (TID) | ORAL | 0 refills | Status: DC | PRN

## 2019-08-28 MED ORDER — FENTANYL CITRATE (PF) 100 MCG/2ML IJ SOLN
INTRAMUSCULAR | Status: DC | PRN
  Administered 2019-08-28 (×2): 25 ug via INTRAVENOUS

## 2019-08-28 SURGICAL SUPPLY — 9 items
BLADE MYR LANCE NRW W/HDL (BLADE) ×3 IMPLANT
CANISTER SUCT 1200ML W/VALVE (MISCELLANEOUS) ×3 IMPLANT
COTTONBALL LRG STERILE PKG (GAUZE/BANDAGES/DRESSINGS) ×3 IMPLANT
GLOVE BIO SURGEON STRL SZ7.5 (GLOVE) ×3 IMPLANT
STRAP BODY AND KNEE 60X3 (MISCELLANEOUS) ×3 IMPLANT
TOWEL OR 17X26 4PK STRL BLUE (TOWEL DISPOSABLE) ×3 IMPLANT
TUBE EAR T 1.27X5.3 BFLY (OTOLOGIC RELATED) ×4 IMPLANT
TUBING CONN 6MMX3.1M (TUBING) ×2
TUBING SUCTION CONN 0.25 STRL (TUBING) ×1 IMPLANT

## 2019-08-28 NOTE — H&P (Signed)
..  History and Physical paper copy reviewed and updated date of procedure and will be scanned into system.  Patient seen and examined.  

## 2019-08-28 NOTE — Anesthesia Preprocedure Evaluation (Signed)
Anesthesia Evaluation  Patient identified by MRN, date of birth, ID band Patient awake    Reviewed: Allergy & Precautions, H&P , NPO status , Patient's Chart, lab work & pertinent test results  History of Anesthesia Complications (+) PONV and history of anesthetic complications  Airway Mallampati: II  TM Distance: >3 FB Neck ROM: full    Dental no notable dental hx.    Pulmonary    Pulmonary exam normal breath sounds clear to auscultation       Cardiovascular Normal cardiovascular exam Rhythm:regular Rate:Normal     Neuro/Psych  Headaches, Seizures -,  PSYCHIATRIC DISORDERS    GI/Hepatic GERD  ,  Endo/Other    Renal/GU      Musculoskeletal   Abdominal   Peds  Hematology   Anesthesia Other Findings   Reproductive/Obstetrics                             Anesthesia Physical Anesthesia Plan  ASA: II  Anesthesia Plan: General   Post-op Pain Management:    Induction:   PONV Risk Score and Plan: 4 or greater and Treatment may vary due to age or medical condition, Propofol infusion, TIVA, Ondansetron and Midazolam  Airway Management Planned:   Additional Equipment:   Intra-op Plan:   Post-operative Plan:   Informed Consent: I have reviewed the patients History and Physical, chart, labs and discussed the procedure including the risks, benefits and alternatives for the proposed anesthesia with the patient or authorized representative who has indicated his/her understanding and acceptance.       Plan Discussed with: CRNA  Anesthesia Plan Comments:         Anesthesia Quick Evaluation

## 2019-08-28 NOTE — Op Note (Signed)
..  08/28/2019  8:13 AM    [name]  GX:[id number]   Pre-Op Dx:  EUSTACHIAN TUBE DYSFUNCTION  Post-op Dx: EUSTACHIAN TUBE DYSFUNCTION  Proc:Bilateral myringotomy with BUTTERFLY tubes  Surg: Anthony Tamburo  Anes:  General by mask  EBL:  None  Comp:  None  Findings:  Retained butterfly tubes with large amount of wax and some granulation tissue around base.  Partially extruded right butterfly tube, perforation slightly larger than diameter of tube on left side.  Procedure: With the patient in a comfortable supine position, general mask anesthesia was administered.  At an appropriate level, microscope and speculum were used to examine and clean the RIGHT ear canal.  The findings were as described above.  A partially extruded BUTTERFLY tube was removed with combination of Rosen and Forensic psychologist.  This shows a posterior inferior perforation with small amount of granulation tissue on posterior aspect.  A new BUTTERFLY tube was placed through the perforation site.  Middle ear contents were suctioned clear with a size 5 otologic suction.  Ciprodex otic solution was instilled into the external canal, and insufflated into the middle ear.  A cotton ball was placed at the external meatus. Hemostasis was observed.  This side was completed.  After completing the RIGHT side, the LEFT side was done in identical fashion.  Except the BUTTERFLY tube that was removed was from anterior position and again with large amount of wax surrounding base.  This was removed showing a larger perforation greater than diameter of the tube.  A BUTTERFLY tube was placed through this existing perforation and seated well without significant mobility.  Following this  The patient was returned to anesthesia, awakened, and transferred to recovery in stable condition.  Dispo:  PACU to home  Plan: Routine drop use and water precautions.  Recheck my office 2 months.   [name] April Nguyen 8:13 AM 08/28/2019

## 2019-08-28 NOTE — Transfer of Care (Signed)
Immediate Anesthesia Transfer of Care Note  Patient: April Nguyen  Procedure(s) Performed: MYRINGOTOMY WITH BUTTERFLY TUBE PLACEMENT (Bilateral Ear)  Patient Location: PACU  Anesthesia Type: General  Level of Consciousness: awake, alert  and patient cooperative  Airway and Oxygen Therapy: Patient Spontanous Breathing and Patient connected to supplemental oxygen  Post-op Assessment: Post-op Vital signs reviewed, Patient's Cardiovascular Status Stable, Respiratory Function Stable, Patent Airway and No signs of Nausea or vomiting  Post-op Vital Signs: Reviewed and stable  Complications: No apparent anesthesia complications

## 2019-08-28 NOTE — Anesthesia Procedure Notes (Signed)
Procedure Name: General with mask airway Performed by: Cameron Ali, CRNA Pre-anesthesia Checklist: Patient identified, Patient being monitored, Emergency Drugs available, Timeout performed and Suction available Patient Re-evaluated:Patient Re-evaluated prior to induction Oxygen Delivery Method: Circle system utilized Preoxygenation: Pre-oxygenation with 100% oxygen Induction Type: Combination inhalational/ intravenous induction Ventilation: Mask ventilation without difficulty Dental Injury: Teeth and Oropharynx as per pre-operative assessment

## 2019-08-28 NOTE — Anesthesia Postprocedure Evaluation (Signed)
Anesthesia Post Note  Patient: April Nguyen  Procedure(s) Performed: MYRINGOTOMY WITH BUTTERFLY TUBE PLACEMENT (Bilateral Ear)     Patient location during evaluation: PACU Anesthesia Type: General Level of consciousness: awake and alert and oriented Pain management: satisfactory to patient Vital Signs Assessment: post-procedure vital signs reviewed and stable Respiratory status: spontaneous breathing, nonlabored ventilation and respiratory function stable Cardiovascular status: blood pressure returned to baseline and stable Postop Assessment: Adequate PO intake and No signs of nausea or vomiting Anesthetic complications: no    Raliegh Ip

## 2019-09-03 ENCOUNTER — Other Ambulatory Visit: Payer: Self-pay | Admitting: Primary Care

## 2019-09-03 DIAGNOSIS — K219 Gastro-esophageal reflux disease without esophagitis: Secondary | ICD-10-CM

## 2019-09-26 DIAGNOSIS — H8102 Meniere's disease, left ear: Secondary | ICD-10-CM | POA: Diagnosis not present

## 2019-09-26 DIAGNOSIS — H6123 Impacted cerumen, bilateral: Secondary | ICD-10-CM | POA: Diagnosis not present

## 2019-09-26 DIAGNOSIS — H6983 Other specified disorders of Eustachian tube, bilateral: Secondary | ICD-10-CM | POA: Diagnosis not present

## 2019-10-03 DIAGNOSIS — H524 Presbyopia: Secondary | ICD-10-CM | POA: Diagnosis not present

## 2019-10-03 DIAGNOSIS — H5203 Hypermetropia, bilateral: Secondary | ICD-10-CM | POA: Diagnosis not present

## 2019-10-03 DIAGNOSIS — H52223 Regular astigmatism, bilateral: Secondary | ICD-10-CM | POA: Diagnosis not present

## 2019-10-03 DIAGNOSIS — H2513 Age-related nuclear cataract, bilateral: Secondary | ICD-10-CM | POA: Diagnosis not present

## 2019-12-18 ENCOUNTER — Other Ambulatory Visit: Payer: Self-pay | Admitting: Primary Care

## 2019-12-18 DIAGNOSIS — I10 Essential (primary) hypertension: Secondary | ICD-10-CM

## 2019-12-18 DIAGNOSIS — H6980 Other specified disorders of Eustachian tube, unspecified ear: Secondary | ICD-10-CM

## 2020-02-10 ENCOUNTER — Other Ambulatory Visit: Payer: Self-pay | Admitting: Primary Care

## 2020-02-10 DIAGNOSIS — K219 Gastro-esophageal reflux disease without esophagitis: Secondary | ICD-10-CM

## 2020-02-13 ENCOUNTER — Other Ambulatory Visit: Payer: Self-pay | Admitting: Primary Care

## 2020-02-13 DIAGNOSIS — R7303 Prediabetes: Secondary | ICD-10-CM

## 2020-02-13 DIAGNOSIS — E785 Hyperlipidemia, unspecified: Secondary | ICD-10-CM

## 2020-02-28 ENCOUNTER — Other Ambulatory Visit: Payer: Medicare Other

## 2020-03-03 ENCOUNTER — Encounter: Payer: Medicare Other | Admitting: Primary Care

## 2020-03-24 ENCOUNTER — Other Ambulatory Visit: Payer: Self-pay

## 2020-03-24 ENCOUNTER — Other Ambulatory Visit (INDEPENDENT_AMBULATORY_CARE_PROVIDER_SITE_OTHER): Payer: Medicare Other

## 2020-03-24 DIAGNOSIS — E785 Hyperlipidemia, unspecified: Secondary | ICD-10-CM | POA: Diagnosis not present

## 2020-03-24 DIAGNOSIS — R7303 Prediabetes: Secondary | ICD-10-CM

## 2020-03-24 LAB — LIPID PANEL
Cholesterol: 233 mg/dL — ABNORMAL HIGH (ref 0–200)
HDL: 57.2 mg/dL (ref >39.00)
LDL Cholesterol: 160 mg/dL — ABNORMAL HIGH (ref 0–99)
NonHDL: 175.75
Total CHOL/HDL Ratio: 4
Triglycerides: 81 mg/dL (ref 0.0–149.0)
VLDL: 16.2 mg/dL (ref 0.0–40.0)

## 2020-03-24 LAB — CBC
HCT: 39.1 % (ref 36.0–46.0)
Hemoglobin: 13.3 g/dL (ref 12.0–15.0)
MCHC: 33.9 g/dL (ref 30.0–36.0)
MCV: 94.8 fl (ref 78.0–100.0)
Platelets: 367 10*3/uL (ref 150.0–400.0)
RBC: 4.12 Mil/uL (ref 3.87–5.11)
RDW: 12.6 % (ref 11.5–15.5)
WBC: 3.9 10*3/uL — ABNORMAL LOW (ref 4.0–10.5)

## 2020-03-24 LAB — COMPREHENSIVE METABOLIC PANEL
ALT: 17 U/L (ref 0–35)
AST: 15 U/L (ref 0–37)
Albumin: 4 g/dL (ref 3.5–5.2)
Alkaline Phosphatase: 54 U/L (ref 39–117)
BUN: 14 mg/dL (ref 6–23)
CO2: 31 mEq/L (ref 19–32)
Calcium: 9.5 mg/dL (ref 8.4–10.5)
Chloride: 101 mEq/L (ref 96–112)
Creatinine, Ser: 0.57 mg/dL (ref 0.40–1.20)
GFR: 105.9 mL/min (ref >60.00)
Glucose, Bld: 96 mg/dL (ref 70–99)
Potassium: 4.2 mEq/L (ref 3.5–5.1)
Sodium: 137 mEq/L (ref 135–145)
Total Bilirubin: 0.4 mg/dL (ref 0.2–1.2)
Total Protein: 7 g/dL (ref 6.0–8.3)

## 2020-03-24 LAB — HEMOGLOBIN A1C: Hgb A1c MFr Bld: 6 % (ref 4.6–6.5)

## 2020-03-31 ENCOUNTER — Encounter: Payer: Self-pay | Admitting: Primary Care

## 2020-03-31 ENCOUNTER — Other Ambulatory Visit: Payer: Self-pay

## 2020-03-31 ENCOUNTER — Ambulatory Visit (INDEPENDENT_AMBULATORY_CARE_PROVIDER_SITE_OTHER): Payer: Medicare Other | Admitting: Primary Care

## 2020-03-31 VITALS — BP 122/72 | HR 113 | Temp 96.2°F | Ht 65.0 in | Wt 153.0 lb

## 2020-03-31 DIAGNOSIS — K219 Gastro-esophageal reflux disease without esophagitis: Secondary | ICD-10-CM

## 2020-03-31 DIAGNOSIS — Z1231 Encounter for screening mammogram for malignant neoplasm of breast: Secondary | ICD-10-CM | POA: Diagnosis not present

## 2020-03-31 DIAGNOSIS — Z23 Encounter for immunization: Secondary | ICD-10-CM | POA: Diagnosis not present

## 2020-03-31 DIAGNOSIS — F418 Other specified anxiety disorders: Secondary | ICD-10-CM

## 2020-03-31 DIAGNOSIS — G4700 Insomnia, unspecified: Secondary | ICD-10-CM

## 2020-03-31 DIAGNOSIS — Z0000 Encounter for general adult medical examination without abnormal findings: Secondary | ICD-10-CM

## 2020-03-31 DIAGNOSIS — M1990 Unspecified osteoarthritis, unspecified site: Secondary | ICD-10-CM

## 2020-03-31 DIAGNOSIS — E2839 Other primary ovarian failure: Secondary | ICD-10-CM

## 2020-03-31 DIAGNOSIS — Z79890 Hormone replacement therapy (postmenopausal): Secondary | ICD-10-CM

## 2020-03-31 DIAGNOSIS — E785 Hyperlipidemia, unspecified: Secondary | ICD-10-CM

## 2020-03-31 DIAGNOSIS — H8109 Meniere's disease, unspecified ear: Secondary | ICD-10-CM

## 2020-03-31 DIAGNOSIS — R7303 Prediabetes: Secondary | ICD-10-CM

## 2020-03-31 MED ORDER — ZOSTER VAC RECOMB ADJUVANTED 50 MCG/0.5ML IM SUSR: 0.5000 mL | Freq: Once | INTRAMUSCULAR | 1 refills | Status: AC

## 2020-03-31 NOTE — Assessment & Plan Note (Addendum)
Managed on premarin cream for pelvic reconstructive surgery in 2017, also on oral premarin since 2019.  She cannot afford the cost of either medications.  She will be researching the cost of Estrace cream. I recommended she try Effexor or Paxil for other symptoms. She will do some research and update.

## 2020-03-31 NOTE — Assessment & Plan Note (Signed)
Doing well on omeprazole, continue same. 

## 2020-03-31 NOTE — Addendum Note (Signed)
Addended by: Jacqualin Combes on: 03/31/2020 04:32 PM   Modules accepted: Orders

## 2020-03-31 NOTE — Assessment & Plan Note (Signed)
Increased mood swings since she's been out of HRT. Discussed options for symptoms of mood swings and vasomotor symptoms, I recommended either Effexor or Paxil.   She would like to do some research and will update.

## 2020-03-31 NOTE — Assessment & Plan Note (Signed)
A1C about the same as last year.  Continue to monitor.

## 2020-03-31 NOTE — Assessment & Plan Note (Signed)
Rx for Shingrix printed, she did not complete last year. Other immunizations UTD. Mammogram and bone density scan due, ordered and pending. Colonoscopy due, she is currently following with GI through [facility] to schedule.  Encouraged regular exercise, healthy diet. Exam today stable. Labs reviewed.

## 2020-03-31 NOTE — Patient Instructions (Signed)
Start exercising. You should be getting 150 minutes of moderate intensity exercise weekly.  It's important to improve your diet by reducing consumption of fast food, fried food, processed snack foods, sugary drinks. Increase consumption of fresh vegetables and fruits, whole grains, water.  Ensure you are drinking 64 ounces of water daily.  Consider venalfaxine (Effexor) pills for hot flashes, mood swings, etc. Research Estrace vaginal cream.  Schedule a lab appointment for 6 months for cholesterol and blood count check.  Call the breast center to schedule your mammogram and bone density tests.  It was a pleasure to see you today!   Preventive Care 67 Years and Older, Female Preventive care refers to lifestyle choices and visits with your health care provider that can promote health and wellness. This includes:  A yearly physical exam. This is also called an annual well check.  Regular dental and eye exams.  Immunizations.  Screening for certain conditions.  Healthy lifestyle choices, such as diet and exercise. What can I expect for my preventive care visit? Physical exam Your health care provider will check:  Height and weight. These may be used to calculate body mass index (BMI), which is a measurement that tells if you are at a healthy weight.  Heart rate and blood pressure.  Your skin for abnormal spots. Counseling Your health care provider may ask you questions about:  Alcohol, tobacco, and drug use.  Emotional well-being.  Home and relationship well-being.  Sexual activity.  Eating habits.  History of falls.  Memory and ability to understand (cognition).  Work and work Statistician.  Pregnancy and menstrual history. What immunizations do I need?  Influenza (flu) vaccine  This is recommended every year. Tetanus, diphtheria, and pertussis (Tdap) vaccine  You may need a Td booster every 10 years. Varicella (chickenpox) vaccine  You may need this  vaccine if you have not already been vaccinated. Zoster (shingles) vaccine  You may need this after age 34. Pneumococcal conjugate (PCV13) vaccine  One dose is recommended after age 79. Pneumococcal polysaccharide (PPSV23) vaccine  One dose is recommended after age 104. Measles, mumps, and rubella (MMR) vaccine  You may need at least one dose of MMR if you were born in 1957 or later. You may also need a second dose. Meningococcal conjugate (MenACWY) vaccine  You may need this if you have certain conditions. Hepatitis A vaccine  You may need this if you have certain conditions or if you travel or work in places where you may be exposed to hepatitis A. Hepatitis B vaccine  You may need this if you have certain conditions or if you travel or work in places where you may be exposed to hepatitis B. Haemophilus influenzae type b (Hib) vaccine  You may need this if you have certain conditions. You may receive vaccines as individual doses or as more than one vaccine together in one shot (combination vaccines). Talk with your health care provider about the risks and benefits of combination vaccines. What tests do I need? Blood tests  Lipid and cholesterol levels. These may be checked every 5 years, or more frequently depending on your overall health.  Hepatitis C test.  Hepatitis B test. Screening  Lung cancer screening. You may have this screening every year starting at age 23 if you have a 30-pack-year history of smoking and currently smoke or have quit within the past 15 years.  Colorectal cancer screening. All adults should have this screening starting at age 45 and continuing until age 74.  Your health care provider may recommend screening at age 70 if you are at increased risk. You will have tests every 1-10 years, depending on your results and the type of screening test.  Diabetes screening. This is done by checking your blood sugar (glucose) after you have not eaten for a while  (fasting). You may have this done every 1-3 years.  Mammogram. This may be done every 1-2 years. Talk with your health care provider about how often you should have regular mammograms.  BRCA-related cancer screening. This may be done if you have a family history of breast, ovarian, tubal, or peritoneal cancers. Other tests  Sexually transmitted disease (STD) testing.  Bone density scan. This is done to screen for osteoporosis. You may have this done starting at age 23. Follow these instructions at home: Eating and drinking  Eat a diet that includes fresh fruits and vegetables, whole grains, lean protein, and low-fat dairy products. Limit your intake of foods with high amounts of sugar, saturated fats, and salt.  Take vitamin and mineral supplements as recommended by your health care provider.  Do not drink alcohol if your health care provider tells you not to drink.  If you drink alcohol: ? Limit how much you have to 0-1 drink a day. ? Be aware of how much alcohol is in your drink. In the [place] one drink equals one 12 oz bottle of beer (355 mL), one 5 oz glass of wine (148 mL), or one 1 oz glass of hard liquor (44 mL). Lifestyle  Take daily care of your teeth and gums.  Stay active. Exercise for at least 30 minutes on 5 or more days each week.  Do not use any products that contain nicotine or tobacco, such as cigarettes, e-cigarettes, and chewing tobacco. If you need help quitting, ask your health care provider.  If you are sexually active, practice safe sex. Use a condom or other form of protection in order to prevent STIs (sexually transmitted infections).  Talk with your health care provider about taking a low-dose aspirin or statin. What's next?  Go to your health care provider once a year for a well check visit.  Ask your health care provider how often you should have your eyes and teeth checked.  Stay up to date on all vaccines. This information is not intended to  replace advice given to you by your health care provider. Make sure you discuss any questions you have with your health care provider. Document Revised: 08/16/2018 Document Reviewed: 08/16/2018 Elsevier Patient Education  2020 [employer].

## 2020-03-31 NOTE — Assessment & Plan Note (Signed)
Suspect recent symptoms are secondary to vasomotor symptoms of menopause. Now off HRT. We will work to come up with alternative treatment.

## 2020-03-31 NOTE — Assessment & Plan Note (Signed)
Increase in LDL to 160, overall HDL is within normal range, normal Trigs.  ASCVD risk score of 8%, discussed what this means. She does not wish to start treatment for hyperlipidemia, she will work on lifestyle changes.   We also discussed her risk for CAD along with oral hormone replacement treatment. Would avoid if possible given LDL.

## 2020-03-31 NOTE — Assessment & Plan Note (Signed)
Chronic, taking Meloxicam nightly. Renal function reviewed and is unremarkable. Continue to monitor.

## 2020-03-31 NOTE — Assessment & Plan Note (Signed)
Following with ENT, continue HCTZ, PRN Meclizine, PRN Otic solution.

## 2020-03-31 NOTE — Progress Notes (Signed)
Subjective:    Patient ID: April Nguyen, female    DOB: 06-Apr-1951, 69 y.o.   MRN: [id number]  HPI  This visit occurred during the SARS-CoV-2 public health emergency.  Safety protocols were in place, including screening questions prior to the visit, additional usage of staff PPE, and extensive cleaning of exam room while observing appropriate contact time as indicated for disinfecting solutions.   [name] is a 69 year old female who presents today for complete physical.  She would also like to discuss symptoms of mood swings, hot flashes, constantly sweating. She ran out of her premarin tablets and cream two months ago due to changes in insurance. She's been using the vaginal cream since reconstructive pelvic floor surgery in 2017 and oral tablets since 2019. Following with GYN but doesn't wish to continue. She is working to find more affordable alternatives to HRT.   Immunizations: -Tetanus: Completed in 2018 -Influenza: Due this season  -Shingles: Completed Zostavax in 2017 -Pneumonia: Completed Pneumovax in 2020 -Covid-19: Completed series   Diet: She endorses a fair diet.  Exercise: She is active  Eye exam: Completed in 2020 Dental exam: Completes semi-annually   Mammogram: Ordered in 2020, never completed Dexa: Completed in 2019 Colonoscopy: Completed in 2014, overdue and will schedule  Hep C Screen: Negative   BP Readings from Last 3 Encounters:  03/31/20 122/72  08/28/19 116/70  07/09/19 120/82   The 10-year ASCVD risk score Mikey Bussing DC Jr., et al., 2013) is: 8%   Values used to calculate the score:     Age: 39 years     Sex: Female     Is Non-Hispanic African American: No     Diabetic: No     Tobacco smoker: No     Systolic Blood Pressure: 759 mmHg     Is BP treated: Yes     HDL Cholesterol: 57.2 mg/dL     Total Cholesterol: 233 mg/dL   Review of Systems  Constitutional: Negative for unexpected weight change.  HENT: Negative for rhinorrhea.     Respiratory: Negative for cough and shortness of breath.   Cardiovascular: Negative for chest pain.  Gastrointestinal: Negative for constipation and diarrhea.  Genitourinary: Negative for difficulty urinating.       Hot flashes, mood swings, constantly sweating.   Musculoskeletal: Negative for arthralgias and myalgias.  Skin: Negative for rash.  Allergic/Immunologic: Positive for environmental allergies.  Neurological: Negative for dizziness, numbness and headaches.  Psychiatric/Behavioral: The patient is nervous/anxious.        Mood swings       Past Medical History:  Diagnosis Date  . Allergy    Seasonal  . Anxiety   . Arthritis    neck, wrists,hands, toes, and feet  . Depression   . Eustachian tube dysfunction   . Family history of adverse reaction to anesthesia    brother - PONV  . Genital warts    In past  . GERD (gastroesophageal reflux disease)   . Meniere's disease    slight hearing loss, car sickness and dizziness  . Migraines    sinus/stress  . PONV (postoperative nausea and vomiting)    and headaches  . Seizures ([place])    Dx 16 yrs ago - hormonal - none at least 3 yrs  . Seizures ([place])    H/O due to hormone issues     Social History   Socioeconomic History  . Marital status: Married    Spouse name: Not on file  .  Number of children: Not on file  . Years of education: Not on file  . Highest education level: Not on file  Occupational History  . Not on file  Tobacco Use  . Smoking status: Never Smoker  . Smokeless tobacco: Never Used  Substance and Sexual Activity  . Alcohol use: No    Alcohol/week: 0.0 standard drinks  . Drug use: No  . Sexual activity: Yes    Partners: Male    Comment: Husband  Other Topics Concern  . Not on file  Social History Narrative   Work at the [employer] at [employer] with husband   Children- daughter and son    Grandchildren- 6    Caffeine- 1 cup in the morning, soda occasionally, green tea hot/cold    Enjoys reading, spending time at [employer], spending time with family.   Social Determinants of Health   Financial Resource Strain:   . Difficulty of Paying Living Expenses:   Food Insecurity:   . Worried About Charity fundraiser in the Last Year:   . Arboriculturist in the Last Year:   Transportation Needs:   . Film/video editor (Medical):   Marland Kitchen Lack of Transportation (Non-Medical):   Physical Activity:   . Days of Exercise per Week:   . Minutes of Exercise per Session:   Stress:   . Feeling of Stress :   Social Connections:   . Frequency of Communication with Friends and Family:   . Frequency of Social Gatherings with Friends and Family:   . Attends Religious Services:   . Active Member of Clubs or Organizations:   . Attends Archivist Meetings:   Marland Kitchen Marital Status:   Intimate Partner Violence:   . Fear of Current or Ex-Partner:   . Emotionally Abused:   Marland Kitchen Physically Abused:   . Sexually Abused:     Past Surgical History:  Procedure Laterality Date  . ABDOMINAL HYSTERECTOMY    . CHOLECYSTECTOMY  2008  . dialation and cartarage  1982, 1999, 2000  . FINGER SURGERY  2011, 2012   Bone fusion of middle finger right and left hands  . FINGER SURGERY  2013   Pin removed from left middle finger  . GANGLION CYST EXCISION  2011, 2012   Left and right hands  . MYRINGOTOMY WITH TUBE PLACEMENT Bilateral 05/13/2015   Procedure: MYRINGOTOMY WITH  BUTTERFLY TUBE PLACEMENT;  Surgeon: Carloyn Manner, MD;  Location: [facility];  Service: ENT;  Laterality: Bilateral;  . MYRINGOTOMY WITH TUBE PLACEMENT Bilateral 08/28/2019   Procedure: MYRINGOTOMY WITH BUTTERFLY TUBE PLACEMENT;  Surgeon: Carloyn Manner, MD;  Location: [facility];  Service: ENT;  Laterality: Bilateral;  . PAROTID GLAND TUMOR EXCISION  1986  . TONSILLECTOMY AND ADENOIDECTOMY  1959  . TUBAL LIGATION  2010    Family History  Problem Relation Age of Onset  . Arthritis Mother   .  Hyperlipidemia Mother   . Hypertension Mother   . Stroke Mother   . Heart disease Mother   . Diabetes Mother   . Hyperlipidemia Father   . Heart disease Father   . Arthritis Brother   . Breast cancer Neg Hx     Allergies  Allergen Reactions  . Septra [Sulfamethoxazole-Trimethoprim] Rash    Current Outpatient Medications on File Prior to Visit  Medication Sig Dispense Refill  . Azelastine-Fluticasone 137-50 MCG/ACT SUSP PLACE 1 SPRAY INTO THE NOSE 2 TIMES DAILY AS DIRECTED 23 g 1  .  cetirizine (ZYRTEC) 10 MG tablet Take 1 tablet (10 mg total) by mouth daily. (Patient taking differently: Take 10 mg by mouth daily. am) 30 tablet 11  . hydrochlorothiazide (HYDRODIURIL) 25 MG tablet TAKE 1 TABLET BY MOUTH DAILY 90 tablet 0  . hydrOXYzine (ATARAX/VISTARIL) 10 MG tablet Take 1-2 tablets by mouth twice daily as needed for long car rides. 30 tablet 0  . meclizine (ANTIVERT) 25 MG tablet Take 1 tablet (25 mg total) by mouth 3 (three) times daily as needed for dizziness (take as needed not with other antihistimines). 30 tablet 0  . meloxicam (MOBIC) 7.5 MG tablet TAKE 1 TABLET BY MOUTH AS NEEDED FOR PAIN. 90 tablet 0  . ofloxacin (FLOXIN) 0.3 % OTIC solution Place 5 drops into both ears daily. 8 drops each ear twice a day    . omeprazole (PRILOSEC) 20 MG capsule TAKE 1 CAPSULE BY MOUTH ONCE DAILY 90 capsule 1  . PREMARIN vaginal cream Place 1 Applicatorful every other day vaginally.   3   No current facility-administered medications on file prior to visit.    BP 122/72   Pulse (!) 113   Temp (!) 96.2 F (35.7 C) (Temporal)   Ht 5\' 5"  (1.651 m)   Wt 153 lb (69.4 kg)   SpO2 98%   BMI 25.46 kg/m    Objective:   Physical Exam HENT:     Right Ear: Tympanic membrane and ear canal normal.     Left Ear: Tympanic membrane and ear canal normal.  Eyes:     Pupils: Pupils are equal, round, and reactive to light.  Cardiovascular:     Rate and Rhythm: Normal rate and regular rhythm.    Pulmonary:     Effort: Pulmonary effort is normal.     Breath sounds: Normal breath sounds.  Abdominal:     General: Bowel sounds are normal.     Palpations: Abdomen is soft.     Tenderness: There is no abdominal tenderness.  Musculoskeletal:        General: Normal range of motion.     Cervical back: Neck supple.  Skin:    General: Skin is warm and dry.  Neurological:     Mental Status: She is alert and oriented to person, place, and time.     Cranial Nerves: No cranial nerve deficit.     Deep Tendon Reflexes:     Reflex Scores:      Patellar reflexes are 2+ on the right side and 2+ on the left side. Psychiatric:        Mood and Affect: Mood normal.            Assessment & Plan:

## 2020-04-22 DIAGNOSIS — K219 Gastro-esophageal reflux disease without esophagitis: Secondary | ICD-10-CM

## 2020-04-22 DIAGNOSIS — N951 Menopausal and female climacteric states: Secondary | ICD-10-CM

## 2020-04-22 DIAGNOSIS — M159 Polyosteoarthritis, unspecified: Secondary | ICD-10-CM

## 2020-04-22 DIAGNOSIS — H6980 Other specified disorders of Eustachian tube, unspecified ear: Secondary | ICD-10-CM

## 2020-04-22 DIAGNOSIS — F418 Other specified anxiety disorders: Secondary | ICD-10-CM

## 2020-04-23 MED ORDER — PAROXETINE HCL 10 MG PO TABS: 10.0000 mg | ORAL_TABLET | Freq: Every day | ORAL | 1 refills | Status: DC

## 2020-04-24 MED ORDER — MELOXICAM 7.5 MG PO TABS: ORAL_TABLET | ORAL | 0 refills | Status: DC

## 2020-04-24 MED ORDER — ESTRADIOL 0.1 MG/GM VA CREA: TOPICAL_CREAM | VAGINAL | 0 refills | Status: DC

## 2020-04-24 MED ORDER — AZELASTINE-FLUTICASONE 137-50 MCG/ACT NA SUSP: 1.0000 | Freq: Two times a day (BID) | NASAL | 3 refills | Status: DC

## 2020-04-24 MED ORDER — OMEPRAZOLE 20 MG PO CPDR: 20.0000 mg | DELAYED_RELEASE_CAPSULE | Freq: Every day | ORAL | 3 refills | Status: DC

## 2020-04-24 MED ORDER — HYDROXYZINE HCL 10 MG PO TABS: ORAL_TABLET | ORAL | 0 refills | Status: DC

## 2020-04-27 DIAGNOSIS — I10 Essential (primary) hypertension: Secondary | ICD-10-CM

## 2020-04-27 MED ORDER — HYDROCHLOROTHIAZIDE 25 MG PO TABS: 25.0000 mg | ORAL_TABLET | Freq: Every day | ORAL | 1 refills | Status: DC

## 2020-05-21 DIAGNOSIS — N951 Menopausal and female climacteric states: Secondary | ICD-10-CM

## 2020-05-21 MED ORDER — PAROXETINE HCL 10 MG PO TABS: 10.0000 mg | ORAL_TABLET | Freq: Every day | ORAL | 1 refills | Status: DC

## 2020-05-21 NOTE — Telephone Encounter (Signed)
Have pulled up ok to send in change?

## 2020-06-18 ENCOUNTER — Other Ambulatory Visit: Payer: Self-pay

## 2020-06-18 DIAGNOSIS — K219 Gastro-esophageal reflux disease without esophagitis: Secondary | ICD-10-CM

## 2020-06-18 DIAGNOSIS — I10 Essential (primary) hypertension: Secondary | ICD-10-CM

## 2020-06-18 DIAGNOSIS — N951 Menopausal and female climacteric states: Secondary | ICD-10-CM

## 2020-06-18 MED ORDER — HYDROCHLOROTHIAZIDE 25 MG PO TABS: 25.0000 mg | ORAL_TABLET | Freq: Every day | ORAL | 1 refills | Status: DC

## 2020-06-18 MED ORDER — PAROXETINE HCL 10 MG PO TABS: 10.0000 mg | ORAL_TABLET | Freq: Every day | ORAL | 1 refills | Status: DC

## 2020-06-18 MED ORDER — OMEPRAZOLE 20 MG PO CPDR: 20.0000 mg | DELAYED_RELEASE_CAPSULE | Freq: Every day | ORAL | 3 refills | Status: DC

## 2020-08-12 ENCOUNTER — Telehealth: Payer: Self-pay

## 2020-08-12 DIAGNOSIS — N951 Menopausal and female climacteric states: Secondary | ICD-10-CM

## 2020-08-12 MED ORDER — ESTRADIOL 0.1 MG/GM VA CREA: TOPICAL_CREAM | VAGINAL | 0 refills | Status: DC

## 2020-08-12 NOTE — Telephone Encounter (Signed)
Please notify patient that we did not receive the refill request. Will send Rx now.

## 2020-08-12 NOTE — Telephone Encounter (Signed)
Called patient no answer l/m to let know called in.

## 2020-08-12 NOTE — Telephone Encounter (Signed)
Ok to fill have not received request

## 2020-08-12 NOTE — Addendum Note (Signed)
Addended by: Pleas Koch on: 08/12/2020 01:39 PM   Modules accepted: Orders

## 2020-08-12 NOTE — Telephone Encounter (Signed)
Pt needs refill on estradiol vaginal cream. She said [facility] sent it over last week and they haven't received a response. She said that she is completely out.

## 2020-09-10 ENCOUNTER — Other Ambulatory Visit: Payer: Self-pay

## 2020-09-10 DIAGNOSIS — M159 Polyosteoarthritis, unspecified: Secondary | ICD-10-CM

## 2020-09-10 MED ORDER — MELOXICAM 7.5 MG PO TABS: ORAL_TABLET | ORAL | 0 refills | Status: DC

## 2020-10-16 ENCOUNTER — Other Ambulatory Visit: Payer: Self-pay

## 2020-10-16 DIAGNOSIS — F418 Other specified anxiety disorders: Secondary | ICD-10-CM

## 2020-10-16 DIAGNOSIS — N951 Menopausal and female climacteric states: Secondary | ICD-10-CM

## 2020-10-19 MED ORDER — ESTRADIOL 0.1 MG/GM VA CREA: TOPICAL_CREAM | VAGINAL | 0 refills | Status: DC

## 2020-10-19 MED ORDER — HYDROXYZINE HCL 10 MG PO TABS: ORAL_TABLET | ORAL | 0 refills | Status: DC

## 2020-10-19 NOTE — Telephone Encounter (Signed)
Refills sent to pharmacy. 

## 2020-10-19 NOTE — Telephone Encounter (Signed)
Last seen CPE 03/31/2020  Hydroxyzine last given 04/24/2020 #30   Estradiol last given 42.5g on 08/12/2020  No follow up  ok to refill

## 2020-11-11 ENCOUNTER — Other Ambulatory Visit: Payer: Self-pay

## 2020-11-11 DIAGNOSIS — N951 Menopausal and female climacteric states: Secondary | ICD-10-CM

## 2020-11-11 DIAGNOSIS — I10 Essential (primary) hypertension: Secondary | ICD-10-CM

## 2020-11-11 MED ORDER — PAROXETINE HCL 10 MG PO TABS: 10.0000 mg | ORAL_TABLET | Freq: Every day | ORAL | 1 refills | Status: DC

## 2020-11-11 MED ORDER — HYDROCHLOROTHIAZIDE 25 MG PO TABS: 25.0000 mg | ORAL_TABLET | Freq: Every day | ORAL | 1 refills | Status: DC

## 2021-01-08 ENCOUNTER — Other Ambulatory Visit: Payer: Self-pay

## 2021-01-08 DIAGNOSIS — N951 Menopausal and female climacteric states: Secondary | ICD-10-CM

## 2021-01-08 NOTE — Telephone Encounter (Signed)
Refill request estrace vaginal Last office visit 03/31/20 Last refill 10/19/20  42.5 G Overdue for labs

## 2021-01-11 MED ORDER — ESTRADIOL 0.1 MG/GM VA CREA: TOPICAL_CREAM | VAGINAL | 0 refills | Status: DC

## 2021-01-11 NOTE — Telephone Encounter (Signed)
Patient is due for CPE/follow up for July 2022, this will be required prior to any further refills.  Please schedule.

## 2021-01-13 ENCOUNTER — Telehealth: Payer: Self-pay | Admitting: Primary Care

## 2021-01-13 NOTE — Telephone Encounter (Signed)
MRS. Cardella CALLED IN WANTED WANTING TO SET UP APT FOR LABS BUT DIDN'T SEE ANY LABS NEEDED.  SHE WAS TOLD TO COME IN December BUT NEVER CAME   PLEASE ADVISE

## 2021-01-13 NOTE — Telephone Encounter (Signed)
She's due for CPE in July, does she just want to wait until then for her labs? If she wants labs now that's fine, but she needs to come in for CPE in July regardless.

## 2021-01-14 NOTE — Telephone Encounter (Signed)
I already refilled this on 01/11/21, did this not go through?

## 2021-01-14 NOTE — Telephone Encounter (Signed)
Called patient let her know that refill was called in on 5/9 she will check with pharmacy if any issues will let our office know.

## 2021-01-14 NOTE — Telephone Encounter (Signed)
I have made CPE app for July she does neef following refill. She is out would like sent to [facility]   Refill request estrace vaginal Last office visit 03/31/20 Last refill 10/19/20  42.5 G Overdue for labs

## 2021-03-09 ENCOUNTER — Ambulatory Visit (INDEPENDENT_AMBULATORY_CARE_PROVIDER_SITE_OTHER): Payer: Medicare Other | Admitting: Primary Care

## 2021-03-09 ENCOUNTER — Other Ambulatory Visit: Payer: Self-pay

## 2021-03-09 ENCOUNTER — Encounter: Payer: Self-pay | Admitting: Primary Care

## 2021-03-09 VITALS — BP 116/68 | HR 91 | Temp 97.6°F | Ht 63.0 in | Wt 158.8 lb

## 2021-03-09 DIAGNOSIS — Z0001 Encounter for general adult medical examination with abnormal findings: Secondary | ICD-10-CM | POA: Diagnosis not present

## 2021-03-09 DIAGNOSIS — N951 Menopausal and female climacteric states: Secondary | ICD-10-CM

## 2021-03-09 DIAGNOSIS — F418 Other specified anxiety disorders: Secondary | ICD-10-CM

## 2021-03-09 DIAGNOSIS — K219 Gastro-esophageal reflux disease without esophagitis: Secondary | ICD-10-CM | POA: Diagnosis not present

## 2021-03-09 DIAGNOSIS — M1990 Unspecified osteoarthritis, unspecified site: Secondary | ICD-10-CM

## 2021-03-09 DIAGNOSIS — Z1231 Encounter for screening mammogram for malignant neoplasm of breast: Secondary | ICD-10-CM

## 2021-03-09 DIAGNOSIS — E785 Hyperlipidemia, unspecified: Secondary | ICD-10-CM

## 2021-03-09 DIAGNOSIS — H8109 Meniere's disease, unspecified ear: Secondary | ICD-10-CM | POA: Diagnosis not present

## 2021-03-09 DIAGNOSIS — G2581 Restless legs syndrome: Secondary | ICD-10-CM

## 2021-03-09 DIAGNOSIS — Z79890 Hormone replacement therapy (postmenopausal): Secondary | ICD-10-CM

## 2021-03-09 DIAGNOSIS — E2839 Other primary ovarian failure: Secondary | ICD-10-CM

## 2021-03-09 DIAGNOSIS — G4700 Insomnia, unspecified: Secondary | ICD-10-CM

## 2021-03-09 LAB — COMPREHENSIVE METABOLIC PANEL
ALT: 19 U/L (ref 0–35)
AST: 18 U/L (ref 0–37)
Albumin: 4 g/dL (ref 3.5–5.2)
Alkaline Phosphatase: 65 U/L (ref 39–117)
BUN: 13 mg/dL (ref 6–23)
CO2: 30 mEq/L (ref 19–32)
Calcium: 9 mg/dL (ref 8.4–10.5)
Chloride: 101 mEq/L (ref 96–112)
Creatinine, Ser: 0.51 mg/dL (ref 0.40–1.20)
GFR: 96.43 mL/min (ref >60.00)
Glucose, Bld: 101 mg/dL — ABNORMAL HIGH (ref 70–99)
Potassium: 4.2 mEq/L (ref 3.5–5.1)
Sodium: 138 mEq/L (ref 135–145)
Total Bilirubin: 0.3 mg/dL (ref 0.2–1.2)
Total Protein: 6.9 g/dL (ref 6.0–8.3)

## 2021-03-09 LAB — LIPID PANEL
Cholesterol: 238 mg/dL — ABNORMAL HIGH (ref 0–200)
HDL: 59.7 mg/dL (ref >39.00)
LDL Cholesterol: 162 mg/dL — ABNORMAL HIGH (ref 0–99)
NonHDL: 178.44
Total CHOL/HDL Ratio: 4
Triglycerides: 82 mg/dL (ref 0.0–149.0)
VLDL: 16.4 mg/dL (ref 0.0–40.0)

## 2021-03-09 LAB — CBC
HCT: 38.8 % (ref 36.0–46.0)
Hemoglobin: 13.1 g/dL (ref 12.0–15.0)
MCHC: 33.8 g/dL (ref 30.0–36.0)
MCV: 94.7 fl (ref 78.0–100.0)
Platelets: 373 10*3/uL (ref 150.0–400.0)
RBC: 4.1 Mil/uL (ref 3.87–5.11)
RDW: 13.3 % (ref 11.5–15.5)
WBC: 4.6 10*3/uL (ref 4.0–10.5)

## 2021-03-09 LAB — URIC ACID: Uric Acid, Serum: 3.5 mg/dL (ref 2.4–7.0)

## 2021-03-09 LAB — HEMOGLOBIN A1C: Hgb A1c MFr Bld: 6 % (ref 4.6–6.5)

## 2021-03-09 LAB — C-REACTIVE PROTEIN: CRP: <1 mg/dL (ref 0.5–20.0)

## 2021-03-09 LAB — SEDIMENTATION RATE: Sed Rate: 13 mm/hr (ref 0–30)

## 2021-03-09 MED ORDER — MELOXICAM 15 MG PO TABS: 15.0000 mg | ORAL_TABLET | Freq: Every day | ORAL | 1 refills | Status: DC | PRN

## 2021-03-09 MED ORDER — HYDROXYZINE HCL 10 MG PO TABS: ORAL_TABLET | ORAL | 0 refills | Status: AC

## 2021-03-09 MED ORDER — GABAPENTIN 100 MG PO CAPS
ORAL_CAPSULE | ORAL | 0 refills | Status: DC
Start: 2021-03-09 — End: 2021-05-13

## 2021-03-09 MED ORDER — ESTRADIOL 0.1 MG/GM VA CREA: TOPICAL_CREAM | VAGINAL | 1 refills | Status: DC

## 2021-03-09 NOTE — Assessment & Plan Note (Signed)
Chronic for 1-2 years, worse now. Trial of gabapentin 100-300 mg HS provided.  She will update.

## 2021-03-09 NOTE — Assessment & Plan Note (Signed)
Doing well without OTC treatment, continue omeprazole 20 mg PRN.

## 2021-03-09 NOTE — Assessment & Plan Note (Addendum)
Chronic, continued, Meloxicam 7.5 mg working minimally.   Increase Meloxicam to 15 mg. Repeat renal function pending.  Also checking autoimmune labs and will rule out gout.   Will also trial gabapentin 100-300 mg HS for restless legs to see if this helps with pain.   She will update.

## 2021-03-09 NOTE — Assessment & Plan Note (Signed)
Immunizations UTD. Mammogram and bone scan overdue, ordered and pending. Colonoscopy due per patient, she will contact [facility].  Discussed the importance of a healthy diet and regular exercise in order for weight loss, and to reduce the risk of further co-morbidity.  Exam today as noted. Labs pending.

## 2021-03-09 NOTE — Assessment & Plan Note (Signed)
Continued, likely a combination of anxiety/stress and restless legs at night.  Trial of gabapentin 100-300 mg HS to see if this helps with sleep/restless legs/arthritis pain.  She will update. Consider increasing Paxil to 20 mg.

## 2021-03-09 NOTE — Assessment & Plan Note (Signed)
Doing well on HCTZ 25 mg daily, continue same.  No longer following with ENT.

## 2021-03-09 NOTE — Assessment & Plan Note (Signed)
Repeat lipid panel pending. 

## 2021-03-09 NOTE — Patient Instructions (Signed)
Stop by the lab prior to leaving today. I will notify you of your results once received.   We increased your dose of Meloxicam to 15 mg. This is for pain.  Try gabapentin 100 mg at bedtime for restless legs/pain/sleep. You can take 1 to 3 capsules at bedtime, start with 1 capsule.   [facility] mamm[facility] and bone density scan.   It was a pleasure to see you today!  Preventive Care 68 Years and Older, Female Preventive care refers to lifestyle choices and visits with your health care provider that can promote health and wellness. This includes: A yearly physical exam. This is also called an annual wellness visit. Regular dental and eye exams. Immunizations. Screening for certain conditions. Healthy lifestyle choices, such as: Eating a healthy diet. Getting regular exercise. Not using drugs or products that contain nicotine and tobacco. Limiting alcohol use. What can I expect for my preventive care visit? Physical exam Your health care provider will check your: Height and weight. These may be used to calculate your BMI (body mass index). BMI is a measurement that tells if you are at a healthy weight. Heart rate and blood pressure. Body temperature. Skin for abnormal spots. Counseling Your health care provider may ask you questions about your: Past medical problems. Family's medical history. Alcohol, tobacco, and drug use. Emotional well-being. Home life and relationship well-being. Sexual activity. Diet, exercise, and sleep habits. History of falls. Memory and ability to understand (cognition). Work and work Statistician. Pregnancy and menstrual history. Access to firearms. What immunizations do I need?  Vaccines are usually given at various ages, according to a schedule. Your health care provider will recommend vaccines for you based on your age, medicalhistory, and lifestyle or other factors, such as travel or where you work. What tests do I  need? Blood tests Lipid and cholesterol levels. These may be checked every 5 years, or more often depending on your overall health. Hepatitis C test. Hepatitis B test. Screening Lung cancer screening. You may have this screening every year starting at age 68 if you have a 30-pack-year history of smoking and currently smoke or have quit within the past 15 years. Colorectal cancer screening. All adults should have this screening starting at age 68 and continuing until age 68. Your health care provider may recommend screening at age 25 if you are at increased risk. You will have tests every 1-10 years, depending on your results and the type of screening test. Diabetes screening. This is done by checking your blood sugar (glucose) after you have not eaten for a while (fasting). You may have this done every 1-3 years. Mammogram. This may be done every 1-2 years. Talk with your health care provider about how often you should have regular mammograms. Abdominal aortic aneurysm (AAA) screening. You may need this if you are a current or former smoker. BRCA-related cancer screening. This may be done if you have a family history of breast, ovarian, tubal, or peritoneal cancers. Other tests STD (sexually transmitted disease) testing, if you are at risk. Bone density scan. This is done to screen for osteoporosis. You may have this done starting at age 68. Talk with your health care provider about your test results, treatment options,and if necessary, the need for more tests. Follow these instructions at home: Eating and drinking  Eat a diet that includes fresh fruits and vegetables, whole grains, lean protein, and low-fat dairy products. Limit your intake of foods with high amounts of  sugar, saturated fats, and salt. Take vitamin and mineral supplements as recommended by your health care provider. Do not drink alcohol if your health care provider tells you not to drink. If you drink alcohol: Limit  how much you have to 0-1 drink a day. Be aware of how much alcohol is in your drink. In the [place] one drink equals one 12 oz bottle of beer (355 mL), one 5 oz glass of wine (148 mL), or one 1 oz glass of hard liquor (44 mL).  Lifestyle Take daily care of your teeth and gums. Brush your teeth every morning and night with fluoride toothpaste. Floss one time each day. Stay active. Exercise for at least 30 minutes 5 or more days each week. Do not use any products that contain nicotine or tobacco, such as cigarettes, e-cigarettes, and chewing tobacco. If you need help quitting, ask your health care provider. Do not use drugs. If you are sexually active, practice safe sex. Use a condom or other form of protection in order to prevent STIs (sexually transmitted infections). Talk with your health care provider about taking a low-dose aspirin or statin. Find healthy ways to cope with stress, such as: Meditation, yoga, or listening to music. Journaling. Talking to a trusted person. Spending time with friends and family. Safety Always wear your seat belt while driving or riding in a vehicle. Do not drive: If you have been drinking alcohol. Do not ride with someone who has been drinking. When you are tired or distracted. While texting. Wear a helmet and other protective equipment during sports activities. If you have firearms in your house, make sure you follow all gun safety procedures. What's next? Visit your health care provider once a year for an annual wellness visit. Ask your health care provider how often you should have your eyes and teeth checked. Stay up to date on all vaccines. This information is not intended to replace advice given to you by your health care provider. Make sure you discuss any questions you have with your healthcare provider. Document Revised: 08/12/2020 Document Reviewed: 08/16/2018 Elsevier Patient Education  2022 [employer].

## 2021-03-09 NOTE — Progress Notes (Signed)
Subjective:    Patient ID: April Nguyen, female    DOB: [birth year], 70 y.o.   MRN: [id number]  HPI  April Nguyen is a very pleasant 70 y.o. female who presents today for complete physical and to discuss a few issues.   She continues to struggle with daily arthritis pain, worse in the morning, takes several hours to get her body moving. She feels her pain to her hands, wrists, feet, lower back, neck, hips. She's never been checked for autoimmune arthritis or gout. She's used Voltaren Gel and taking Meloxicam with little improvement.   She would also like to discuss difficulty sleeping. She has mind racing thoughts at night which prevent her from falling and staying sleep. She's noticed restless legs at night, chronic for about one year ago. She is managed on Paxil 10 mg for hot flashes, does well on this regimen. Has never tried gabapentin.   Immunizations: -Tetanus: 2018 -Influenza: Due this season  -Covid-19: 2 vaccines -Shingles: Zostavax 2017 -Pneumonia: Prevnar 48 in 2021, Pneumovax in 2020   Diet: [place].  Exercise: No regular exercise, is active.   Eye exam: Completes annually  Dental exam: Completes semi-annually   Mammogram: Completed in 2019 Dexa: Completed in 2019 Colonoscopy: Completed in 2014, due now.      Review of Systems  Constitutional:  Negative for unexpected weight change.  HENT:  Negative for rhinorrhea.   Respiratory:  Negative for cough and shortness of breath.   Cardiovascular:  Negative for chest pain.  Gastrointestinal:  Negative for constipation and diarrhea.  Genitourinary:  Negative for difficulty urinating.  Musculoskeletal:  Positive for arthralgias.  Skin:  Negative for rash.  Allergic/Immunologic: Negative for environmental allergies.  Neurological:  Negative for dizziness and headaches.       Restless legs at night  Psychiatric/Behavioral:  Positive for sleep disturbance. The patient is nervous/anxious.         Past  Medical History:  Diagnosis Date   Allergy    Seasonal   Anxiety    Arthritis    neck, wrists,hands, toes, and feet   Depression    Eustachian tube dysfunction    Family history of adverse reaction to anesthesia    brother - PONV   Genital warts    In past   GERD (gastroesophageal reflux disease)    History of shingles 04/16/2015   March 2016    Meniere's disease    slight hearing loss, car sickness and dizziness   Migraines    sinus/stress   PONV (postoperative nausea and vomiting)    and headaches   Seizures ([place])    Dx 16 yrs ago - hormonal - none at least 3 yrs   Seizures ([place])    H/O due to hormone issues    Social History   Socioeconomic History   Marital status: Married    Spouse name: Not on file   Number of children: Not on file   Years of education: Not on file   Highest education level: Not on file  Occupational History   Not on file  Tobacco Use   Smoking status: Never   Smokeless tobacco: Never  Vaping Use   Vaping Use: Never used  Substance and Sexual Activity   Alcohol use: No    Alcohol/week: 0.0 standard drinks   Drug use: No   Sexual activity: Yes    Partners: Male    Comment: Husband  Other Topics Concern   Not on file  Social  History Narrative   Work at the [employer] at [employer] with husband   Children- daughter and son    Grandchildren- 6    Caffeine- 1 cup in the morning, soda occasionally, green tea hot/cold   Enjoys reading, spending time at [employer], spending time with family.   Social Determinants of Health   Financial Resource Strain: Not on file  Food Insecurity: Not on file  Transportation Needs: Not on file  Physical Activity: Not on file  Stress: Not on file  Social Connections: Not on file  Intimate Partner Violence: Not on file    Past Surgical History:  Procedure Laterality Date   ABDOMINAL HYSTERECTOMY     CHOLECYSTECTOMY  2008   dialation and cartarage  1982, 1999, [place]  2011, 2012    Bone fusion of middle finger right and left hands   FINGER SURGERY  2013   Pin removed from left middle finger   GANGLION CYST EXCISION  2011, 2012   Left and right hands   MYRINGOTOMY WITH TUBE PLACEMENT Bilateral 05/13/2015   Procedure: MYRINGOTOMY WITH  BUTTERFLY TUBE PLACEMENT;  Surgeon: Carloyn Manner, MD;  Location: [facility];  Service: ENT;  Laterality: Bilateral;   MYRINGOTOMY WITH TUBE PLACEMENT Bilateral 08/28/2019   Procedure: MYRINGOTOMY WITH BUTTERFLY TUBE PLACEMENT;  Surgeon: Carloyn Manner, MD;  Location: [facility];  Service: ENT;  Laterality: Bilateral;   [place]  2010    Family History  Problem Relation Age of Onset   Arthritis Mother    Hyperlipidemia Mother    Hypertension Mother    Stroke Mother    Heart disease Mother    Diabetes Mother    Hyperlipidemia Father    Heart disease Father    Arthritis Brother    Breast cancer Neg Hx     Allergies  Allergen Reactions   Septra [Sulfamethoxazole-Trimethoprim] Rash    Current Outpatient Medications on File Prior to Visit  Medication Sig Dispense Refill   cetirizine (ZYRTEC) 10 MG tablet Take 1 tablet (10 mg total) by mouth daily. (Patient taking differently: Take 10 mg by mouth daily. am) 30 tablet 11   estradiol (ESTRACE VAGINAL) 0.1 MG/GM vaginal cream Apply 2-3 times weekly. 42.5 g 0   hydrochlorothiazide (HYDRODIURIL) 25 MG tablet Take 1 tablet (25 mg total) by mouth daily. 90 tablet 1   hydrOXYzine (ATARAX/VISTARIL) 10 MG tablet Take 1-2 tablets by mouth twice daily as needed for long car rides. 30 tablet 0   meloxicam (MOBIC) 7.5 MG tablet TAKE 1 TABLET BY MOUTH AS NEEDED FOR PAIN. 90 tablet 0   ofloxacin (FLOXIN) 0.3 % OTIC solution Place 5 drops into both ears daily. 8 drops each ear twice a day     omeprazole (PRILOSEC) 20 MG capsule Take 1 capsule (20 mg total) by mouth daily. For heartburn. 90  capsule 3   PARoxetine (PAXIL) 10 MG tablet Take 1 tablet (10 mg total) by mouth daily. For hot flashes. 90 tablet 1   Azelastine-Fluticasone 137-50 MCG/ACT SUSP Place 1 spray into the nose in the morning and at bedtime. (Patient not taking: Reported on 03/09/2021) 23 g 3   meclizine (ANTIVERT) 25 MG tablet Take 1 tablet (25 mg total) by mouth 3 (three) times daily as needed for dizziness (take as needed not with other antihistimines). (Patient not taking: Reported on 03/09/2021) 30 tablet 0  No current facility-administered medications on file prior to visit.    BP 116/68   Pulse 91   Temp 97.6 F (36.4 C) (Temporal)   Ht 5\' 3"  (1.6 m)   Wt 158 lb 12.8 oz (72 kg)   SpO2 98%   BMI 28.13 kg/m  Objective:   Physical Exam HENT:     Right Ear: Tympanic membrane and ear canal normal.     Left Ear: Tympanic membrane and ear canal normal.     Nose: Nose normal.  Eyes:     Conjunctiva/sclera: Conjunctivae normal.     Pupils: Pupils are equal, round, and reactive to light.  Neck:     Thyroid: No thyromegaly.  Cardiovascular:     Rate and Rhythm: Normal rate and regular rhythm.     Heart sounds: No murmur heard. Pulmonary:     Effort: Pulmonary effort is normal.     Breath sounds: Normal breath sounds. No rales.  Abdominal:     General: Bowel sounds are normal.     Palpations: Abdomen is soft.     Tenderness: There is no abdominal tenderness.  Musculoskeletal:        General: Normal range of motion.     Cervical back: Neck supple.  Lymphadenopathy:     Cervical: No cervical adenopathy.  Skin:    General: Skin is warm and dry.     Findings: No rash.  Neurological:     Mental Status: She is alert and oriented to person, place, and time.     Cranial Nerves: No cranial nerve deficit.     Deep Tendon Reflexes: Reflexes are normal and symmetric.  Psychiatric:        Mood and Affect: Mood normal.          Assessment & Plan:      This visit occurred during the SARS-CoV-2  public health emergency.  Safety protocols were in place, including screening questions prior to the visit, additional usage of staff PPE, and extensive cleaning of exam room while observing appropriate contact time as indicated for disinfecting solutions.

## 2021-03-09 NOTE — Assessment & Plan Note (Signed)
Doing well on Estrace cream, continue same.

## 2021-03-09 NOTE — Assessment & Plan Note (Signed)
No issues with Paxil, however, has been under a lot of stress, isn't sleeping well.  Discussed options, will remain on Paxil 10 mg for now, if no improvement on gabapentin HS for restless legs, sleep, and arthritis pain, then consider dose increase of Paxil to 20 mg.

## 2021-03-10 LAB — CYCLIC CITRUL PEPTIDE ANTIBODY, IGG: Cyclic Citrullin Peptide Ab: <16 UNITS

## 2021-03-10 LAB — RHEUMATOID FACTOR: Rheumatoid fact SerPl-aCnc: <14 IU/mL (ref <14)

## 2021-03-10 MED ORDER — ROSUVASTATIN CALCIUM 10 MG PO TABS: 10.0000 mg | ORAL_TABLET | Freq: Every day | ORAL | 3 refills | Status: DC

## 2021-05-04 ENCOUNTER — Other Ambulatory Visit: Payer: Self-pay | Admitting: Primary Care

## 2021-05-04 DIAGNOSIS — I10 Essential (primary) hypertension: Secondary | ICD-10-CM

## 2021-05-11 ENCOUNTER — Other Ambulatory Visit: Payer: Self-pay

## 2021-05-11 DIAGNOSIS — M1990 Unspecified osteoarthritis, unspecified site: Secondary | ICD-10-CM

## 2021-05-11 DIAGNOSIS — G2581 Restless legs syndrome: Secondary | ICD-10-CM

## 2021-05-12 NOTE — Telephone Encounter (Signed)
See my chart message

## 2021-05-13 MED ORDER — GABAPENTIN 300 MG PO CAPS: 300.0000 mg | ORAL_CAPSULE | Freq: Every day | ORAL | 1 refills | Status: DC

## 2021-05-20 DIAGNOSIS — F418 Other specified anxiety disorders: Secondary | ICD-10-CM

## 2021-05-20 MED ORDER — PAROXETINE HCL 20 MG PO TABS: 20.0000 mg | ORAL_TABLET | Freq: Every day | ORAL | 2 refills | Status: DC

## 2021-08-09 ENCOUNTER — Other Ambulatory Visit: Payer: Self-pay | Admitting: Primary Care

## 2021-08-09 DIAGNOSIS — M1990 Unspecified osteoarthritis, unspecified site: Secondary | ICD-10-CM

## 2021-08-09 DIAGNOSIS — K219 Gastro-esophageal reflux disease without esophagitis: Secondary | ICD-10-CM

## 2021-08-09 DIAGNOSIS — G2581 Restless legs syndrome: Secondary | ICD-10-CM

## 2021-08-19 ENCOUNTER — Other Ambulatory Visit: Payer: Self-pay | Admitting: Primary Care

## 2021-08-19 DIAGNOSIS — N951 Menopausal and female climacteric states: Secondary | ICD-10-CM

## 2021-09-08 ENCOUNTER — Other Ambulatory Visit: Payer: Self-pay

## 2021-09-08 ENCOUNTER — Encounter: Payer: Self-pay | Admitting: Family Medicine

## 2021-09-08 ENCOUNTER — Ambulatory Visit (INDEPENDENT_AMBULATORY_CARE_PROVIDER_SITE_OTHER): Payer: Medicare Other | Admitting: Family Medicine

## 2021-09-08 DIAGNOSIS — U071 COVID-19: Secondary | ICD-10-CM | POA: Insufficient documentation

## 2021-09-08 DIAGNOSIS — Z8669 Personal history of other diseases of the nervous system and sense organs: Secondary | ICD-10-CM

## 2021-09-08 HISTORY — DX: COVID-19: U07.1

## 2021-09-08 MED ORDER — FLUTICASONE PROPIONATE 50 MCG/ACT NA SUSP: 2.0000 | Freq: Every day | NASAL | 1 refills | Status: DC

## 2021-09-08 MED ORDER — MECLIZINE HCL 25 MG PO TABS: 25.0000 mg | ORAL_TABLET | Freq: Three times a day (TID) | ORAL | 0 refills | Status: AC | PRN

## 2021-09-08 MED ORDER — MOLNUPIRAVIR EUA 200MG CAPSULE: 4.0000 | ORAL_CAPSULE | Freq: Two times a day (BID) | ORAL | 0 refills | Status: AC

## 2021-09-08 NOTE — Progress Notes (Signed)
Virtual Visit via Telephone Note  I connected with April Nguyen on 09/08/21 at  8:00 AM EST by telephone and verified that I am speaking with the correct person using two identifiers.  Location: Patient: home Provider: office   I discussed the limitations, risks, security and privacy concerns of performing an evaluation and management service by telephone and the availability of in person appointments. I also discussed with the patient that there may be a patient responsible charge related to this service. The patient expressed understanding and agreed to proceed.  Parties involved in encounter  Patient: April Nguyen  Provider:  Loura Pardon MD   History of Present Illness: Pt presents with covid 77  71 yo pt of NP Clark with h/o prediabetes   Symptoms started Saturday-coughed all day  Sunday am - tested pos for covid  Headache - all over Fever with sweats  Nausea without vomiting  Dizziness (has meniere's)- does not have any  Ear pain on and off No ST No diarrhea  Lost taste and smell  Sneeze and runny nose -clear mucous  Some congestion -in head  Cough is dry/not productive  No sob- but feels exercise intolerant  Feels like she is a little wheezy  Otc Mucinex D Zinc Vit C Tylenol   Does not feel like she could come for labs    Lab Results  Component Value Date   CREATININE 0.51 03/09/2021   BUN 13 03/09/2021   NA 138 03/09/2021   K 4.2 03/09/2021   CL 101 03/09/2021   CO2 30 03/09/2021   Patient Active Problem List   Diagnosis Date Noted   COVID-19 09/08/2021   Restless legs 03/09/2021   Otalgia 07/09/2019   Prediabetes 02/26/2019   Chronic neck pain 02/26/2019   Hormone replacement therapy (HRT) 02/22/2018   Meniere disease 02/22/2018   Encounter for annual general medical examination with abnormal findings in adult 02/11/2016   Hyperlipidemia 02/11/2016   Insomnia 01/22/2016   Depression with anxiety 04/16/2015   GERD (gastroesophageal reflux  disease) 04/16/2015   Arthritis 04/16/2015   ETD (eustachian tube dysfunction) 04/16/2015   Past Medical History:  Diagnosis Date   Allergy    Seasonal   Anxiety    Arthritis    neck, wrists,hands, toes, and feet   Depression    Eustachian tube dysfunction    Family history of adverse reaction to anesthesia    brother - PONV   Genital warts    In past   GERD (gastroesophageal reflux disease)    History of shingles 04/16/2015   March 2016    Meniere's disease    slight hearing loss, car sickness and dizziness   Migraines    sinus/stress   PONV (postoperative nausea and vomiting)    and headaches   Seizures ([place])    Dx 16 yrs ago - hormonal - none at least 3 yrs   Seizures ([place])    H/O due to hormone issues   Past Surgical History:  Procedure Laterality Date   ABDOMINAL HYSTERECTOMY     CHOLECYSTECTOMY  2008   dialation and cartarage  1982, 1999, 2000   FINGER SURGERY  2011, 2012   Bone fusion of middle finger right and left hands   FINGER SURGERY  2013   Pin removed from left middle finger   GANGLION CYST EXCISION  2011, 2012   Left and right hands   MYRINGOTOMY WITH TUBE PLACEMENT Bilateral 05/13/2015   Procedure: MYRINGOTOMY WITH  BUTTERFLY TUBE PLACEMENT;  Surgeon: Carloyn Manner, MD;  Location: [facility];  Service: ENT;  Laterality: Bilateral;   MYRINGOTOMY WITH TUBE PLACEMENT Bilateral 08/28/2019   Procedure: MYRINGOTOMY WITH BUTTERFLY TUBE PLACEMENT;  Surgeon: Carloyn Manner, MD;  Location: [facility];  Service: ENT;  Laterality: Bilateral;   [place]  2010   Social History   Tobacco Use   Smoking status: Never   Smokeless tobacco: Never  Vaping Use   Vaping Use: Never used  Substance Use Topics   Alcohol use: No    Alcohol/week: 0.0 standard drinks   Drug use: No   Family History  Problem Relation Age of Onset   Arthritis Mother     Hyperlipidemia Mother    Hypertension Mother    Stroke Mother    Heart disease Mother    Diabetes Mother    Hyperlipidemia Father    Heart disease Father    Arthritis Brother    Breast cancer Neg Hx    Allergies  Allergen Reactions   Septra [Sulfamethoxazole-Trimethoprim] Rash   Current Outpatient Medications on File Prior to Visit  Medication Sig Dispense Refill   cetirizine (ZYRTEC) 10 MG tablet Take 1 tablet (10 mg total) by mouth daily. (Patient taking differently: Take 10 mg by mouth daily. am) 30 tablet 11   estradiol (ESTRACE) 0.1 MG/GM vaginal cream APPLY 2 TO 3 TIMES WEEKLY 42.5 g 1   gabapentin (NEURONTIN) 300 MG capsule Take 1 capsule (300 mg total) by mouth at bedtime. For restless legs. 90 capsule 1   hydrochlorothiazide (HYDRODIURIL) 25 MG tablet TAKE 1 TABLET BY MOUTH ONCE A DAY 90 tablet 3   hydrOXYzine (ATARAX/VISTARIL) 10 MG tablet Take 1-2 tablets by mouth twice daily as needed for long car rides. 30 tablet 0   meloxicam (MOBIC) 15 MG tablet Take 1 tablet (15 mg total) by mouth daily. As needed for pain. 90 tablet 1   omeprazole (PRILOSEC) 20 MG capsule TAKE 1 CAPSULE BY MOUTH ONCE DAILY FOR HEARTBURN 90 capsule 1   PARoxetine (PAXIL) 20 MG tablet Take 1 tablet (20 mg total) by mouth daily. For anxiety. 90 tablet 2   rosuvastatin (CRESTOR) 10 MG tablet Take 1 tablet (10 mg total) by mouth daily. For cholesterol. 90 tablet 3   No current facility-administered medications on file prior to visit.   Review of Systems  Constitutional:  Positive for fever and malaise/fatigue. Negative for chills and diaphoresis.  HENT:  Positive for congestion and ear pain. Negative for sinus pain and sore throat.   Eyes:  Negative for blurred vision, discharge and redness.  Respiratory:  Positive for cough and wheezing. Negative for sputum production, shortness of breath and stridor.   Cardiovascular:  Negative for chest pain, palpitations and leg swelling.  Gastrointestinal:  Negative  for abdominal pain, diarrhea, nausea and vomiting.  Musculoskeletal:  Negative for myalgias.  Skin:  Negative for rash.  Neurological:  Positive for dizziness and headaches.     Observations/Objective: Pt sounds well, in no distress Mildly hoarse and congested sounding Frequent dry cough  Unable to hear wheezing  Not sob with speech Good historian/nl cognition Nl mood   Assessment and Plan: Problem List Items Addressed This Visit       Other   COVID-19    Day 4 in 72 yo pt with meniere's dz  Mild to moderate symptoms incl fever and dizziness Sent in meclizine for dizziness and  nausea and flonase for congestion and ETD (has ear tubes) Declines prednisone since wheezing is mild Enc fluids and rest  Unable to come for labs to take paxlovid, so sent molnupiravir to pharmacy (disc poss side effects)  Disc symptom control  Discussed isolation protocol ER parameters discussed Update if not starting to improve in a week or if worsening        Relevant Medications   molnupiravir EUA (LAGEVRIO) 200 mg CAPS capsule   Other Visit Diagnoses     History of Meniere's disease       Relevant Medications   meclizine (ANTIVERT) 25 MG tablet        Follow Up Instructions: Drink fluids and rest  mucinex DM or mucinex D are good for cough and congestion  Nasal saline for congestion as needed  Try the flonase for congestion and ear pain  I sent in meclizine for dizziness and nausea (caution of sedation) Tylenol for fever or pain or headache  Take the molnupiravir as directed Isolate until symptoms are better and then mask for at least 10 days Please alert [place] if symptoms worsen (if severe or short of breath please go to the ER)  Update if not starting to improve in a week or if worsening    I discussed the assessment and treatment plan with the patient. The patient was provided an opportunity to ask questions and all were answered. The patient agreed with the plan and demonstrated an  understanding of the instructions.   The patient was advised to call back or seek an in-person evaluation if the symptoms worsen or if the condition fails to improve as anticipated.  I provided 17 minutes of non-face-to-face time during this encounter.   Loura Pardon, MD

## 2021-09-08 NOTE — Assessment & Plan Note (Signed)
Day 4 in 71 yo pt with meniere's dz  Mild to moderate symptoms incl fever and dizziness Sent in meclizine for dizziness and nausea and flonase for congestion and ETD (has ear tubes) Declines prednisone since wheezing is mild Enc fluids and rest  Unable to come for labs to take paxlovid, so sent molnupiravir to pharmacy (disc poss side effects)  Disc symptom control  Discussed isolation protocol ER parameters discussed Update if not starting to improve in a week or if worsening

## 2021-09-08 NOTE — Patient Instructions (Signed)
Drink fluids and rest  mucinex DM or mucinex D are good for cough and congestion  Nasal saline for congestion as needed  Try the flonase for congestion and ear pain  I sent in meclizine for dizziness and nausea (caution of sedation) Tylenol for fever or pain or headache  Take the molnupiravir as directed Isolate until symptoms are better and then mask for at least 10 days Please alert [place] if symptoms worsen (if severe or short of breath please go to the ER)  Update if not starting to improve in a week or if worsening

## 2021-09-10 ENCOUNTER — Telehealth: Payer: Self-pay | Admitting: *Deleted

## 2021-09-10 NOTE — Telephone Encounter (Signed)
-----   Message from Abner Greenspan, MD sent at 09/08/2021  8:26 AM EST ----- Manjinder Breau, please check on her tomorrow (covid)

## 2021-09-10 NOTE — Telephone Encounter (Signed)
Called April Nguyen to check on her. April Nguyen said she still is a little sick with HA, fatigue and congestion but she can tell a big difference in her sxs she is a lot better and she has no questions or concerns she is taking it easy and will let us know if sxs worsen or don't continue to improve. April Nguyen thanked Dr. Glori Bickers for checking on her

## 2021-09-17 ENCOUNTER — Encounter: Payer: Self-pay | Admitting: Family Medicine

## 2021-09-20 NOTE — Telephone Encounter (Signed)
Pt stated that her husband has covid now but she is doing much better and will call when she needs an appt

## 2021-09-22 NOTE — Telephone Encounter (Signed)
Noted, glad she is doing better! 

## 2021-10-28 ENCOUNTER — Ambulatory Visit: Payer: Medicare Other | Admitting: Family

## 2021-10-29 ENCOUNTER — Other Ambulatory Visit: Payer: Self-pay

## 2021-10-29 ENCOUNTER — Ambulatory Visit (INDEPENDENT_AMBULATORY_CARE_PROVIDER_SITE_OTHER): Payer: Medicare Other | Admitting: Family

## 2021-10-29 ENCOUNTER — Ambulatory Visit (INDEPENDENT_AMBULATORY_CARE_PROVIDER_SITE_OTHER)
Admission: RE | Admit: 2021-10-29 | Discharge: 2021-10-29 | Disposition: A | Discharge status: Home / Self Care | Payer: Medicare Other | Source: Ambulatory Visit | Attending: Family | Admitting: Family

## 2021-10-29 ENCOUNTER — Encounter: Payer: Self-pay | Admitting: Family

## 2021-10-29 VITALS — BP 128/78 | HR 86 | Ht 64.0 in | Wt 159.0 lb

## 2021-10-29 DIAGNOSIS — M2550 Pain in unspecified joint: Secondary | ICD-10-CM

## 2021-10-29 DIAGNOSIS — M1990 Unspecified osteoarthritis, unspecified site: Secondary | ICD-10-CM

## 2021-10-29 DIAGNOSIS — M79672 Pain in left foot: Secondary | ICD-10-CM

## 2021-10-29 MED ORDER — MELOXICAM 15 MG PO TABS: 15.0000 mg | ORAL_TABLET | Freq: Every day | ORAL | 1 refills | Status: DC

## 2021-10-29 MED ORDER — PREDNISONE 20 MG PO TABS
ORAL_TABLET | ORAL | 0 refills | Status: DC
Start: 2021-10-29 — End: 2022-03-30

## 2021-10-29 NOTE — Progress Notes (Signed)
Established Patient Office Visit  Subjective:  Patient ID: April Nguyen, female    DOB: 1951-09-05  Age: 71 y.o. MRN: [id number]  CC:  Chief Complaint  Patient presents with   Foot Pain    Left bottom heel/arch/toes worse with pressure--3 weeks    HPI April Nguyen is here today with concerns.   Left heel pain, sometimes on sides of foot and occasional pain on the toes of the left foot. Worse with pressure or bearing weight but sometimes even when at rest. Does have some swelling at her arch of her foot.   Does c/o multiple arthralgias mainly to include bil hands/fingers/toes, right hip and especially neck. Suffers daily with meloxican 15 mg once daily although finds herself taking twice daily. Stiffness in the am greater than 1 hour. Recent RF spep crp uric acid wnl. No recent ANA. No known autoimmune disease in the family. Finds herself on heating pad twice daily just to function.    Past Medical History:  Diagnosis Date   Allergy    Seasonal   Anxiety    Arthritis    neck, wrists,hands, toes, and feet   Depression    Eustachian tube dysfunction    Family history of adverse reaction to anesthesia    brother - PONV   Genital warts    In past   GERD (gastroesophageal reflux disease)    History of shingles 04/16/2015   March 2016    Meniere's disease    slight hearing loss, car sickness and dizziness   Migraines    sinus/stress   PONV (postoperative nausea and vomiting)    and headaches   Seizures ([place])    Dx 16 yrs ago - hormonal - none at least 3 yrs   Seizures ([place])    H/O due to hormone issues    Past Surgical History:  Procedure Laterality Date   ABDOMINAL HYSTERECTOMY     CHOLECYSTECTOMY  2008   dialation and cartarage  1982, 1999, [place]  2011, 2012   Bone fusion of middle finger right and left hands   FINGER SURGERY  2013   Pin removed from left middle finger   GANGLION CYST EXCISION  2011, 2012   Left and right hands   MYRINGOTOMY  WITH TUBE PLACEMENT Bilateral 05/13/2015   Procedure: MYRINGOTOMY WITH  BUTTERFLY TUBE PLACEMENT;  Surgeon: Carloyn Manner, MD;  Location: [facility];  Service: ENT;  Laterality: Bilateral;   MYRINGOTOMY WITH TUBE PLACEMENT Bilateral 08/28/2019   Procedure: MYRINGOTOMY WITH BUTTERFLY TUBE PLACEMENT;  Surgeon: Carloyn Manner, MD;  Location: [facility];  Service: ENT;  Laterality: Bilateral;   PAROTID GLAND TUMOR [place]  2010    Family History  Problem Relation Age of Onset   Arthritis Mother    Hyperlipidemia Mother    Hypertension Mother    Stroke Mother    Heart disease Mother    Diabetes Mother    Hyperlipidemia Father    Heart disease Father    Arthritis Brother    Breast cancer Neg Hx     Social History   Socioeconomic History   Marital status: Married    Spouse name: Not on file   Number of children: Not on file   Years of education: Not on file   Highest education level: Not on file  Occupational History   Not on file  Tobacco Use  Smoking status: Never   Smokeless tobacco: Never  Vaping Use   Vaping Use: Never used  Substance and Sexual Activity   Alcohol use: No    Alcohol/week: 0.0 standard drinks   Drug use: No   Sexual activity: Yes    Partners: Male    Comment: Husband  Other Topics Concern   Not on file  Social History Narrative   Work at the [employer] at [employer] with husband   Children- daughter and son    Grandchildren- 6    Caffeine- 1 cup in the morning, soda occasionally, green tea hot/cold   Enjoys reading, spending time at [employer], spending time with family.   Social Determinants of Health   Financial Resource Strain: Not on file  Food Insecurity: Not on file  Transportation Needs: Not on file  Physical Activity: Not on file  Stress: Not on file  Social Connections: Not on file  Intimate Partner Violence: Not on file    Outpatient  Medications Prior to Visit  Medication Sig Dispense Refill   cetirizine (ZYRTEC) 10 MG tablet Take 1 tablet (10 mg total) by mouth daily. (Patient taking differently: Take 10 mg by mouth daily. am) 30 tablet 11   estradiol (ESTRACE) 0.1 MG/GM vaginal cream APPLY 2 TO 3 TIMES WEEKLY 42.5 g 1   fluticasone (FLONASE) 50 MCG/ACT nasal spray Place 2 sprays into both nostrils daily. 16 g 1   gabapentin (NEURONTIN) 300 MG capsule Take 1 capsule (300 mg total) by mouth at bedtime. For restless legs. 90 capsule 1   hydrochlorothiazide (HYDRODIURIL) 25 MG tablet TAKE 1 TABLET BY MOUTH ONCE A DAY 90 tablet 3   hydrOXYzine (ATARAX/VISTARIL) 10 MG tablet Take 1-2 tablets by mouth twice daily as needed for long car rides. 30 tablet 0   meclizine (ANTIVERT) 25 MG tablet Take 1 tablet (25 mg total) by mouth 3 (three) times daily as needed for dizziness (take as needed not with other antihistimines). 30 tablet 0   omeprazole (PRILOSEC) 20 MG capsule TAKE 1 CAPSULE BY MOUTH ONCE DAILY FOR HEARTBURN 90 capsule 1   PARoxetine (PAXIL) 20 MG tablet Take 1 tablet (20 mg total) by mouth daily. For anxiety. 90 tablet 2   rosuvastatin (CRESTOR) 10 MG tablet Take 1 tablet (10 mg total) by mouth daily. For cholesterol. 90 tablet 3   meloxicam (MOBIC) 15 MG tablet Take 1 tablet (15 mg total) by mouth daily. As needed for pain. 90 tablet 1   No facility-administered medications prior to visit.    Allergies  Allergen Reactions   Septra [Sulfamethoxazole-Trimethoprim] Rash    ROS Review of Systems    Objective:    Physical Exam  BP 128/78    Pulse 86    Ht 5\' 4"  (1.626 m)    Wt 159 lb (72.1 kg)    SpO2 99%    BMI 27.29 kg/m  Wt Readings from Last 3 Encounters:  10/29/21 159 lb (72.1 kg)  03/09/21 158 lb 12.8 oz (72 kg)  03/31/20 153 lb (69.4 kg)     Health Maintenance Due  Topic Date Due   Zoster Vaccines- Shingrix (1 of 2) Never done   COVID-19 Vaccine (3 - Booster) 11/29/2019   MAMMOGRAM  04/03/2020    INFLUENZA VACCINE  04/05/2021    There are no preventive care reminders to display for this patient.  Lab Results  Component Value Date   TSH 0.83 02/09/2016   Lab Results  Component Value Date  WBC 4.6 03/09/2021   HGB 13.1 03/09/2021   HCT 38.8 03/09/2021   MCV 94.7 03/09/2021   PLT 373.0 03/09/2021   Lab Results  Component Value Date   NA 138 03/09/2021   K 4.2 03/09/2021   CO2 30 03/09/2021   GLUCOSE 101 (H) 03/09/2021   BUN 13 03/09/2021   CREATININE 0.51 03/09/2021   BILITOT 0.3 03/09/2021   ALKPHOS 65 03/09/2021   AST 18 03/09/2021   ALT 19 03/09/2021   PROT 6.9 03/09/2021   ALBUMIN 4.0 03/09/2021   CALCIUM 9.0 03/09/2021   ANIONGAP 9 07/21/2017   GFR 96.43 03/09/2021   Lab Results  Component Value Date   HGBA1C 6.0 03/09/2021      Assessment & Plan:   Problem List Items Addressed This Visit       Musculoskeletal and Integument   Arthritis   Relevant Medications   meloxicam (MOBIC) 15 MG tablet   predniSONE (DELTASONE) 20 MG tablet   Other Relevant Orders   Ambulatory referral to Rheumatology     Other   Polyarthralgia    Lab work for autoimmune disease within normal limits however patient with worsening arthritic pain, patient referred to rheumatology for consult      Relevant Medications   predniSONE (DELTASONE) 20 MG tablet   Other Relevant Orders   Ambulatory referral to Rheumatology   Left foot pain - Primary    Some inflammation could be possible spur.  Also possibility for worsening of arthritic symptoms.  Suspected Planter fasciitis as tenderness on the plantar aspect.  Advised patient to freeze a water bottle and roll this throughout the day as well as roll a tennis ball and do stretching exercises with a band.  X-ray of left foot to rule out any other concerns.  Patient to continue with meloxicam but advised her not to take double of 15 mg as this is over maximum recommended daily.  Referral to rheumatologist as well.  Given Rx for  prednisone burst for pain and inflammation      Relevant Medications   predniSONE (DELTASONE) 20 MG tablet   Other Relevant Orders   DG Foot Complete Left    Meds ordered this encounter  Medications   meloxicam (MOBIC) 15 MG tablet    Sig: Take 1 tablet (15 mg total) by mouth daily. As needed for pain.    Dispense:  90 tablet    Refill:  1    Order Specific Question:   Supervising Provider    Answer:   BEDSOLE, AMY E [[id number]]   predniSONE (DELTASONE) 20 MG tablet    Sig: Take two tablets daily for 3 days followed by one tablet daily for 4 days    Dispense:  10 tablet    Refill:  0    Order Specific Question:   Supervising Provider    Answer:   Diona Browner, AMY E [[id number]]    Follow-up: No follow-ups on file.    April Pancoast, FNP

## 2021-10-29 NOTE — Patient Instructions (Signed)
A referral was placed today for rheumatology.  Please let us know if you have not heard back within 1 week about your referral.  Complete xray(s) prior to leaving today. I will notify you of your results once received.  It was a pleasure seeing you today! Please do not hesitate to reach out with any questions and or concerns.  Regards,   Eugenia Pancoast FNP-C

## 2021-11-01 DIAGNOSIS — M79672 Pain in left foot: Secondary | ICD-10-CM | POA: Insufficient documentation

## 2021-11-01 DIAGNOSIS — M2550 Pain in unspecified joint: Secondary | ICD-10-CM | POA: Insufficient documentation

## 2021-11-01 NOTE — Assessment & Plan Note (Signed)
Lab work for autoimmune disease within normal limits however patient with worsening arthritic pain, patient referred to rheumatology for consult

## 2021-11-01 NOTE — Assessment & Plan Note (Addendum)
Some inflammation could be possible spur.  Also possibility for worsening of arthritic symptoms.  Suspected Planter fasciitis as tenderness on the plantar aspect.  Advised patient to freeze a water bottle and roll this throughout the day as well as roll a tennis ball and do stretching exercises with a band.  X-ray of left foot to rule out any other concerns.  Patient to continue with meloxicam but advised her not to take double of 15 mg as this is over maximum recommended daily.  Referral to rheumatologist as well.  Given Rx for prednisone burst for pain and inflammation

## 2021-11-02 ENCOUNTER — Other Ambulatory Visit: Payer: Self-pay | Admitting: Primary Care

## 2021-11-02 ENCOUNTER — Encounter: Payer: Self-pay | Admitting: Family

## 2021-11-02 ENCOUNTER — Ambulatory Visit: Payer: Medicare Other | Admitting: Family Medicine

## 2021-11-02 DIAGNOSIS — G2581 Restless legs syndrome: Secondary | ICD-10-CM

## 2021-11-02 DIAGNOSIS — M1990 Unspecified osteoarthritis, unspecified site: Secondary | ICD-10-CM

## 2021-11-03 ENCOUNTER — Ambulatory Visit: Payer: Medicare Other | Admitting: Family Medicine

## 2021-11-04 NOTE — Telephone Encounter (Signed)
Im confused, was this for Ortho Referral? The only referral in the system is for Rheumatology and that was sent to  ? ?G. Cristi Loron, MD, FACP ?Rheumatologist in [place], [place] ?Address: [address] [place], [place], [place] [address] ?Phone: [phone] ? ?I do not see a referral for Ortho ?

## 2021-11-23 DIAGNOSIS — M2550 Pain in unspecified joint: Secondary | ICD-10-CM | POA: Diagnosis not present

## 2021-11-23 DIAGNOSIS — M47812 Spondylosis without myelopathy or radiculopathy, cervical region: Secondary | ICD-10-CM | POA: Diagnosis not present

## 2021-11-23 DIAGNOSIS — M19041 Primary osteoarthritis, right hand: Secondary | ICD-10-CM | POA: Diagnosis not present

## 2021-11-23 DIAGNOSIS — M19042 Primary osteoarthritis, left hand: Secondary | ICD-10-CM | POA: Diagnosis not present

## 2021-11-23 DIAGNOSIS — M159 Polyosteoarthritis, unspecified: Secondary | ICD-10-CM | POA: Diagnosis not present

## 2021-11-23 DIAGNOSIS — M50323 Other cervical disc degeneration at C6-C7 level: Secondary | ICD-10-CM | POA: Diagnosis not present

## 2021-11-23 DIAGNOSIS — M4319 Spondylolisthesis, multiple sites in spine: Secondary | ICD-10-CM | POA: Diagnosis not present

## 2021-11-23 DIAGNOSIS — M7989 Other specified soft tissue disorders: Secondary | ICD-10-CM | POA: Diagnosis not present

## 2021-11-24 DIAGNOSIS — M542 Cervicalgia: Secondary | ICD-10-CM | POA: Diagnosis not present

## 2021-11-24 DIAGNOSIS — M5412 Radiculopathy, cervical region: Secondary | ICD-10-CM | POA: Diagnosis not present

## 2021-12-20 ENCOUNTER — Inpatient Hospital Stay: Payer: Medicare Other | Admitting: Internal Medicine

## 2021-12-20 ENCOUNTER — Inpatient Hospital Stay: Payer: Medicare Other

## 2021-12-29 ENCOUNTER — Inpatient Hospital Stay: Payer: Medicare Other | Attending: Internal Medicine | Admitting: Internal Medicine

## 2021-12-29 ENCOUNTER — Encounter: Payer: Self-pay | Admitting: Internal Medicine

## 2021-12-29 ENCOUNTER — Inpatient Hospital Stay (HOSPITAL_BASED_OUTPATIENT_CLINIC_OR_DEPARTMENT_OTHER): Payer: Medicare Other

## 2021-12-29 DIAGNOSIS — Z808 Family history of malignant neoplasm of other organs or systems: Secondary | ICD-10-CM

## 2021-12-29 DIAGNOSIS — D472 Monoclonal gammopathy: Secondary | ICD-10-CM | POA: Diagnosis not present

## 2021-12-29 LAB — CBC WITH DIFFERENTIAL/PLATELET
Abs Immature Granulocytes: 0.01 10*3/uL (ref 0.00–0.07)
Basophils Absolute: 0.1 10*3/uL (ref 0.0–0.1)
Basophils Relative: 1 %
Eosinophils Absolute: 0.2 10*3/uL (ref 0.0–0.5)
Eosinophils Relative: 3 %
HCT: 41.3 % (ref 36.0–46.0)
Hemoglobin: 13.5 g/dL (ref 12.0–15.0)
Immature Granulocytes: 0 %
Lymphocytes Relative: 22 %
Lymphs Abs: 1.4 10*3/uL (ref 0.7–4.0)
MCH: 31.4 pg (ref 26.0–34.0)
MCHC: 32.7 g/dL (ref 30.0–36.0)
MCV: 96 fL (ref 80.0–100.0)
Monocytes Absolute: 0.4 10*3/uL (ref 0.1–1.0)
Monocytes Relative: 7 %
Neutro Abs: 4.5 10*3/uL (ref 1.7–7.7)
Neutrophils Relative %: 67 %
Platelets: 375 10*3/uL (ref 150–400)
RBC: 4.3 MIL/uL (ref 3.87–5.11)
RDW: 12.2 % (ref 11.5–15.5)
WBC: 6.6 10*3/uL (ref 4.0–10.5)
nRBC: 0 % (ref 0.0–0.2)

## 2021-12-29 LAB — COMPREHENSIVE METABOLIC PANEL
ALT: 19 U/L (ref 0–44)
AST: 20 U/L (ref 15–41)
Albumin: 4.3 g/dL (ref 3.5–5.0)
Alkaline Phosphatase: 57 U/L (ref 38–126)
Anion gap: 7 (ref 5–15)
BUN: 14 mg/dL (ref 8–23)
CO2: 27 mmol/L (ref 22–32)
Calcium: 9.1 mg/dL (ref 8.9–10.3)
Chloride: 102 mmol/L (ref 98–111)
Creatinine, Ser: 0.58 mg/dL (ref 0.44–1.00)
GFR, Estimated: >60 mL/min (ref >60)
Glucose, Bld: 95 mg/dL (ref 70–99)
Potassium: 3.9 mmol/L (ref 3.5–5.1)
Sodium: 136 mmol/L (ref 135–145)
Total Bilirubin: 0.3 mg/dL (ref 0.3–1.2)
Total Protein: 7.7 g/dL (ref 6.5–8.1)

## 2021-12-29 LAB — LACTATE DEHYDROGENASE: LDH: 150 U/L (ref 98–192)

## 2021-12-29 NOTE — Assessment & Plan Note (Addendum)
#  Incidental-IgM lambda 0.3 g/dL.  July 20.  CBC CMP- WNL.  ? ?I had a long discussion with the patient regarding natural history of MGUS; small risk of progression to multiple myeloma/lymphomas etc. Patient is less likely at this time patient has any active myeloma/lymphoma given lack of any clinically concerns at this time. ? ?#I would recommend checking CBC CMP LDH kappa lambda light chain ratio myeloma panel at this time.  ? ?#Arthritis-?  Osteoarthritis defer to rheumatology. On celebrex.  ? ?Thank you Dr.Patel for allowing me to participate in the care of your pleasant patient. Please do not hesitate to contact me with questions or concerns in the interim. ? ?# DISPOSITION: ?# labs today- ordred ?# follow up in 2-3 weeks; MD; no labs- Dr.B ?

## 2021-12-29 NOTE — Progress Notes (Signed)
New patient evaluation.   

## 2021-12-29 NOTE — Progress Notes (Signed)
[name], [name]; [name] ? ?Patient has a chronic history of joint pains for which she was evaluated by rheumatology.  As part of work-up patient noted to have abnormal M protein.  She has no known diagnosis of anemia or kidney problems or elevated calcium. ? ?No unusual bone pain.  Chronic mild fatigue.  Tingling numbness in extremities- none ? ?Family history of multiple myeloma- NONE/ or lymphoma.  ? ?Review of Systems  ?Constitutional:  Positive for malaise/fatigue. Negative for chills, diaphoresis, fever and weight loss.  ?HENT:  Negative for nosebleeds and sore throat.   ?Eyes:  Negative for double vision.  ?Respiratory:  Negative for cough, hemoptysis, sputum production, shortness of breath and wheezing.   ?Cardiovascular:  Negative for chest pain, palpitations, orthopnea and leg swelling.  ?Gastrointestinal:  Negative for abdominal pain, blood in stool, constipation, diarrhea, heartburn, melena, nausea and vomiting.  ?Genitourinary:  Negative for dysuria, frequency and urgency.  ?Musculoskeletal:  Positive for back pain and joint pain.  ?Skin: Negative.  Negative for itching and rash.  ?Neurological:  Negative for dizziness, tingling, focal weakness, weakness and headaches.  ?Endo/Heme/Allergies:  Does not bruise/bleed easily.  ?Psychiatric/Behavioral:  Negative for depression. The patient is not nervous/anxious and does not have insomnia.   ? ?MEDICAL HISTORY:  ?Past Medical History:  ?Diagnosis Date   ? Allergy   ? Seasonal  ? Anxiety   ? Arthritis   ? neck, wrists,hands, toes, and feet  ? Depression   ? Eustachian tube dysfunction   ? Family history of adverse reaction to anesthesia   ? brother - PONV  ? Genital warts   ? In past  ? GERD (gastroesophageal reflux disease)   ? History of shingles 04/16/2015  ? March 2016   ? history of Tumor of parotid gland   ? Meniere's disease   ? slight hearing loss, car sickness and dizziness  ? Migraines   ? sinus/stress  ? PONV (postoperative nausea and vomiting)   ? and headaches  ? Seizures ([place])   ? Dx 16 yrs ago - hormonal - none at least 3 yrs  ? Seizures ([place])   ? H/O due to hormone issues  ? ? ?SURGICAL HISTORY: ?Past Surgical History:  ?Procedure Laterality Date  ? ABDOMINAL HYSTERECTOMY    ? CHOLECYSTECTOMY  2008  ? dialation and [place], 1999, 2000  ? FINGER SURGERY  2011, 2012  ? Bone fusion of middle finger right and left hands  ? FINGER SURGERY  2013  ? Pin removed from left middle finger  ? GANGLION CYST EXCISION  2011, 2012  ? Left and right hands  ? MYRINGOTOMY WITH TUBE PLACEMENT Bilateral 05/13/2015  ? Procedure: MYRINGOTOMY WITH  BUTTERFLY TUBE PLACEMENT;  Surgeon: Carloyn Manner, MD;  Location: [facility];  Service: ENT;  Laterality: Bilateral;  ? MYRINGOTOMY WITH TUBE PLACEMENT Bilateral 08/28/2019  ? Procedure: MYRINGOTOMY WITH BUTTERFLY TUBE PLACEMENT;  Surgeon: Carloyn Manner, MD;  Location: [facility];  Service: ENT;  Laterality: Bilateral;  ? PAROTID GLAND TUMOR EXCISION  1986  ?  TONSILLECTOMY AND ADENOIDECTOMY  1959  ? TUBAL LIGATION  2010  ? ? ?SOCIAL HISTORY: ?Social History  ? ?Socioeconomic History  ? Marital status: Married  ?  Spouse name: Not on file  ? Number of children: Not on file  ? Years of education: Not on file  ? Highest education level: Not on file  ?Occupational History  ? Not on file  ?Tobacco Use  ? Smoking status: Never  ? Smokeless tobacco: Never  ?Vaping Use  ? Vaping Use: Never used  ?Substance  and Sexual Activity  ? Alcohol use: No  ?  Alcohol/week: 0.0 standard drinks  ? Drug use: No  ? Sexual activity: Yes  ?  Partners: Male  ?  Comment: Husband  ?Other Topics Concern  ? Not on file  ?Social History Narrative  ? Work at the [employer] at [employer].  Lives with husband with [place]. Children- daughter and son;no smoking or alcohol.  ?   ? -------------------------------------------------------------------------------   ? Grandchildren- 6   ? Caffeine- 1 cup in the morning, soda occasionally, green tea hot/cold  ? Enjoys reading, spending time at [employer], spending time with family.  ? ?Social Determinants of Health  ? ?Financial Resource Strain: Not on file  ?Food Insecurity: Not on file  ?Transportation Needs: Not on file  ?Physical Activity: Not on file  ?Stress: Not on file  ?Social Connections: Not on file  ?Intimate Partner Violence: Not on file  ? ? ?FAMILY HISTORY: ?Family History  ?Problem Relation Age of Onset  ? Arthritis Mother   ? Hyperlipidemia Mother   ? Hypertension Mother   ? Stroke Mother   ? Heart disease Mother   ? Diabetes Mother   ? Hyperlipidemia Father   ? Heart disease Father   ? Arthritis Brother   ? Throat cancer Maternal Grandfather   ? Breast cancer Neg Hx   ? ? ?ALLERGIES:  is allergic to septra [sulfamethoxazole-trimethoprim]. ? ?MEDICATIONS:  ?Current Outpatient Medications  ?Medication Sig Dispense Refill  ? celecoxib (CELEBREX) 100 MG capsule Take 200 mg by mouth 2 (two) times daily.    ? cetirizine (ZYRTEC) 10 MG tablet Take 1 tablet (10 mg total) by mouth daily. (Patient taking differently: Take 10 mg by mouth daily. am) 30 tablet 11  ? estradiol (ESTRACE) 0.1 MG/GM vaginal cream APPLY 2 TO 3 TIMES WEEKLY 42.5 g 1  ? fluticasone (FLONASE) 50 MCG/ACT nasal spray Place 2 sprays into both nostrils daily. 16 g 1  ? gabapentin (NEURONTIN) 300 MG capsule TAKE 1 CAPSULE BY MOUTH EVERY NIGHT AT BEDTIME FOR RESTLESS LEGS 90 capsule 0  ? hydrochlorothiazide  (HYDRODIURIL) 25 MG tablet TAKE 1 TABLET BY MOUTH ONCE A DAY 90 tablet 3  ? hydrOXYzine (ATARAX/VISTARIL) 10 MG tablet Take 1-2 tablets by mouth twice daily as needed for long car rides. 30 tablet 0  ? meclizine (ANTIVERT) 25 MG tablet Take 1 tablet (25 mg total) by mouth 3 (three) times daily as needed for dizziness (take as needed not with other antihistimines). 30 tablet 0  ? omeprazole (PRILOSEC) 20 MG capsule TAKE 1 CAPSULE BY MOUTH ONCE DAILY FOR HEARTBURN 90 capsule 1  ? PARoxetine (PAXIL) 20 MG tablet Take 1 tablet (20 mg total) by mouth daily. For anxiety. 90 tablet 2  ? predniSONE (DELTASONE) 20 MG tablet Take two tablets daily for 3 days followed by one tablet daily for 4 days 10 tablet 0  ? rosuvastatin (CRESTOR) 10 MG tablet Take 1 tablet (10  mg total) by mouth daily. For cholesterol. 90 tablet 3  ? meloxicam (MOBIC) 15 MG tablet Take 1 tablet (15 mg total) by mouth daily. As needed for pain. (Patient not taking: Reported on 12/29/2021) 90 tablet 1  ? ?No current facility-administered medications for this visit.  ? ? ? ? ?PHYSICAL EXAMINATION: ? ? ?Vitals:  ? 12/29/21 1300  ?BP: 135/83  ?Pulse: 97  ?Resp: 16  ?Temp: 98.8 ?F (37.1 ?C)  ? ?Filed Weights  ? 12/29/21 1300  ?Weight: 160 lb 9.6 oz (72.8 kg)  ? ? ?Physical Exam ?Vitals and nursing note reviewed.  ?HENT:  ?   Head: Normocephalic and atraumatic.  ?   Mouth/Throat:  ?   Pharynx: Oropharynx is clear.  ?Eyes:  ?   Extraocular Movements: Extraocular movements intact.  ?   Pupils: Pupils are equal, round, and reactive to light.  ?Cardiovascular:  ?   Rate and Rhythm: Normal rate and regular rhythm.  ?Pulmonary:  ?   Comments: Decreased breath sounds bilaterally.  ?Abdominal:  ?   Palpations: Abdomen is soft.  ?Musculoskeletal:     ?   General: Normal range of motion.  ?   Cervical back: Normal range of motion.  ?Skin: ?   General: Skin is warm.  ?Neurological:  ?   General: No focal deficit present.  ?   Mental Status: She is alert and oriented to  person, place, and time.  ?Psychiatric:     ?   Behavior: Behavior normal.     ?   Judgment: Judgment normal.  ? ? ?LABORATORY DATA:  ?I have reviewed the data as listed ?Lab Results  ?Component Value Date  ? WBC

## 2021-12-30 LAB — KAPPA/LAMBDA LIGHT CHAINS
Kappa free light chain: 23.2 mg/L — ABNORMAL HIGH (ref 3.3–19.4)
Kappa, lambda light chain ratio: 1.25 (ref 0.26–1.65)
Lambda free light chains: 18.6 mg/L (ref 5.7–26.3)

## 2021-12-31 LAB — MULTIPLE MYELOMA PANEL, SERUM
Albumin SerPl Elph-Mcnc: 3.9 g/dL (ref 2.9–4.4)
Albumin/Glob SerPl: 1.3 (ref 0.7–1.7)
Alpha 1: 0.2 g/dL (ref 0.0–0.4)
Alpha2 Glob SerPl Elph-Mcnc: 0.7 g/dL (ref 0.4–1.0)
B-Globulin SerPl Elph-Mcnc: 1 g/dL (ref 0.7–1.3)
Gamma Glob SerPl Elph-Mcnc: 1.2 g/dL (ref 0.4–1.8)
Globulin, Total: 3.1 g/dL (ref 2.2–3.9)
IgA: 303 mg/dL (ref 87–352)
IgG (Immunoglobin G), Serum: 1029 mg/dL (ref 586–1602)
IgM (Immunoglobulin M), Srm: 351 mg/dL — ABNORMAL HIGH (ref 26–217)
M Protein SerPl Elph-Mcnc: 0.4 g/dL — ABNORMAL HIGH
Total Protein ELP: 7 g/dL (ref 6.0–8.5)

## 2022-01-21 ENCOUNTER — Inpatient Hospital Stay: Payer: Medicare Other | Attending: Internal Medicine | Admitting: Internal Medicine

## 2022-01-21 ENCOUNTER — Encounter: Payer: Self-pay | Admitting: Internal Medicine

## 2022-01-21 DIAGNOSIS — D472 Monoclonal gammopathy: Secondary | ICD-10-CM

## 2022-01-21 DIAGNOSIS — Z79899 Other long term (current) drug therapy: Secondary | ICD-10-CM | POA: Diagnosis not present

## 2022-01-21 NOTE — Progress Notes (Signed)
[facility] NOTE  Patient Care Team: Pleas Koch, NP as PCP - General (Internal Medicine) Cammie Sickle, MD as Consulting Physician (Oncology)  CHIEF COMPLAINTS/PURPOSE OF CONSULTATION: Monoclonal gammopathy  HEMATOLOGY HISTORY  MARCH 2023- IgM 03 gm/dl ]Rheumatology; [place][Immunofixation shows IgM monoclonal protein with lambda light chain  specificity; MAY 2023- IgM [place]- 0.4 gm/dl; K/L=[place]. Hb13; creatinine- 0.58  #  rheumatology- joint pains  HISTORY OF PRESENTING ILLNESS: Alone. Independent ambulating.  April Nguyen 71 y.o.  female is here to review the results of her work-up ordered for monoclonal gammopathy.  Patient denies any new symptoms.  Chronic mild fatigue.  Complains difficulty sleeping.   Review of Systems  Constitutional:  Positive for malaise/fatigue. Negative for chills, diaphoresis, fever and weight loss.  HENT:  Negative for nosebleeds and sore throat.   Eyes:  Negative for double vision.  Respiratory:  Negative for cough, hemoptysis, sputum production, shortness of breath and wheezing.   Cardiovascular:  Negative for chest pain, palpitations, orthopnea and leg swelling.  Gastrointestinal:  Negative for abdominal pain, blood in stool, constipation, diarrhea, heartburn, melena, nausea and vomiting.  Genitourinary:  Negative for dysuria, frequency and urgency.  Musculoskeletal:  Positive for back pain and joint pain.  Skin: Negative.  Negative for itching and rash.  Neurological:  Negative for dizziness, tingling, focal weakness, weakness and headaches.  Endo/Heme/Allergies:  Does not bruise/bleed easily.  Psychiatric/Behavioral:  Negative for depression. The patient is not nervous/anxious and does not have insomnia.    MEDICAL HISTORY:  Past Medical History:  Diagnosis Date   Allergy    Seasonal   Anxiety    Arthritis    neck, wrists,hands, toes, and feet   Depression    Eustachian tube dysfunction    Family  history of adverse reaction to anesthesia    brother - PONV   Genital warts    In past   GERD (gastroesophageal reflux disease)    History of shingles 04/16/2015   March 2016    history of Tumor of parotid gland    Meniere's disease    slight hearing loss, car sickness and dizziness   Migraines    sinus/stress   PONV (postoperative nausea and vomiting)    and headaches   Seizures ([place])    Dx 16 yrs ago - hormonal - none at least 3 yrs   Seizures ([place])    H/O due to hormone issues    SURGICAL HISTORY: Past Surgical History:  Procedure Laterality Date   ABDOMINAL HYSTERECTOMY     CHOLECYSTECTOMY  2008   dialation and cartarage  1982, 1999, [place] SURGERY  2011, 2012   Bone fusion of middle finger right and left hands   FINGER SURGERY  2013   Pin removed from left middle finger   GANGLION CYST EXCISION  2011, 2012   Left and right hands   MYRINGOTOMY WITH TUBE PLACEMENT Bilateral 05/13/2015   Procedure: MYRINGOTOMY WITH  BUTTERFLY TUBE PLACEMENT;  Surgeon: Carloyn Manner, MD;  Location: [facility];  Service: ENT;  Laterality: Bilateral;   MYRINGOTOMY WITH TUBE PLACEMENT Bilateral 08/28/2019   Procedure: MYRINGOTOMY WITH BUTTERFLY TUBE PLACEMENT;  Surgeon: Carloyn Manner, MD;  Location: [facility];  Service: ENT;  Laterality: Bilateral;   [place]  2010    SOCIAL HISTORY: Social History   Socioeconomic History   Marital status: Married  Spouse name: Not on file   Number of children: Not on file   Years of education: Not on file   Highest education level: Not on file  Occupational History   Not on file  Tobacco Use   Smoking status: Never   Smokeless tobacco: Never  Vaping Use   Vaping Use: Never used  Substance and Sexual Activity   Alcohol use: No    Alcohol/week: 0.0 standard drinks   Drug use: No   Sexual activity: Yes    Partners: Male     Comment: Husband  Other Topics Concern   Not on file  Social History Narrative   Work at the [employer] at [employer].  Lives with husband with [place]. Children- daughter and son;no smoking or alcohol.      -------------------------------------------------------------------------------    Grandchildren- 6    Caffeine- 1 cup in the morning, soda occasionally, green tea hot/cold   Enjoys reading, spending time at beach, spending time with family.   Social Determinants of Health   Financial Resource Strain: Not on file  Food Insecurity: Not on file  Transportation Needs: Not on file  Physical Activity: Not on file  Stress: Not on file  Social Connections: Not on file  Intimate Partner Violence: Not on file    FAMILY HISTORY: Family History  Problem Relation Age of Onset   Arthritis Mother    Hyperlipidemia Mother    Hypertension Mother    Stroke Mother    Heart disease Mother    Diabetes Mother    Hyperlipidemia Father    Heart disease Father    Arthritis Brother    Throat cancer Maternal Grandfather    Breast cancer Neg Hx     ALLERGIES:  is allergic to septra [sulfamethoxazole-trimethoprim].  MEDICATIONS:  Current Outpatient Medications  Medication Sig Dispense Refill   celecoxib (CELEBREX) 100 MG capsule Take 200 mg by mouth 2 (two) times daily.     cetirizine (ZYRTEC) 10 MG tablet Take 1 tablet (10 mg total) by mouth daily. (Patient taking differently: Take 10 mg by mouth daily. am) 30 tablet 11   estradiol (ESTRACE) 0.1 MG/GM vaginal cream APPLY 2 TO 3 TIMES WEEKLY 42.5 g 1   fluticasone (FLONASE) 50 MCG/ACT nasal spray Place 2 sprays into both nostrils daily. 16 g 1   gabapentin (NEURONTIN) 300 MG capsule TAKE 1 CAPSULE BY MOUTH EVERY NIGHT AT BEDTIME FOR RESTLESS LEGS 90 capsule 0   hydrochlorothiazide (HYDRODIURIL) 25 MG tablet TAKE 1 TABLET BY MOUTH ONCE A DAY 90 tablet 3   hydrOXYzine (ATARAX/VISTARIL) 10 MG tablet Take 1-2 tablets by mouth twice  daily as needed for long car rides. 30 tablet 0   meclizine (ANTIVERT) 25 MG tablet Take 1 tablet (25 mg total) by mouth 3 (three) times daily as needed for dizziness (take as needed not with other antihistimines). 30 tablet 0   omeprazole (PRILOSEC) 20 MG capsule TAKE 1 CAPSULE BY MOUTH ONCE DAILY FOR HEARTBURN 90 capsule 1   PARoxetine (PAXIL) 20 MG tablet Take 1 tablet (20 mg total) by mouth daily. For anxiety. 90 tablet 2   rosuvastatin (CRESTOR) 10 MG tablet Take 1 tablet (10 mg total) by mouth daily. For cholesterol. 90 tablet 3   meloxicam (MOBIC) 15 MG tablet Take 1 tablet (15 mg total) by mouth daily. As needed for pain. 90 tablet 1   predniSONE (DELTASONE) 20 MG tablet Take two tablets daily for 3 days followed by one tablet daily for 4 days  10 tablet 0   No current facility-administered medications for this visit.      PHYSICAL EXAMINATION:   Vitals:   01/21/22 1436  BP: 124/69  Pulse: 87  Resp: 18  Temp: 98.8 F (37.1 C)   Filed Weights   01/21/22 1436  Weight: 162 lb 12.8 oz (73.8 kg)    Physical Exam Vitals and nursing note reviewed.  HENT:     Head: Normocephalic and atraumatic.     Mouth/Throat:     Pharynx: Oropharynx is clear.  Eyes:     Extraocular Movements: Extraocular movements intact.     Pupils: Pupils are equal, round, and reactive to light.  Cardiovascular:     Rate and Rhythm: Normal rate and regular rhythm.  Pulmonary:     Comments: Decreased breath sounds bilaterally.  Abdominal:     Palpations: Abdomen is soft.  Musculoskeletal:        General: Normal range of motion.     Cervical back: Normal range of motion.  Skin:    General: Skin is warm.  Neurological:     General: No focal deficit present.     Mental Status: She is alert and oriented to person, place, and time.  Psychiatric:        Behavior: Behavior normal.        Judgment: Judgment normal.    LABORATORY DATA:  I have reviewed the data as listed Lab Results  Component  Value Date   WBC 6.6 12/29/2021   HGB 13.5 12/29/2021   HCT 41.3 12/29/2021   MCV 96.0 12/29/2021   PLT 375 12/29/2021   Recent Labs    03/09/21 1002 12/29/21 1433  NA 138 136  K 4.2 3.9  CL 101 102  CO2 30 27  GLUCOSE 101* 95  BUN 13 14  CREATININE 0.51 0.58  CALCIUM 9.0 9.1  GFRNONAA  --  >60  PROT 6.9 7.7  ALBUMIN 4.0 4.3  AST 18 20  ALT 19 19  ALKPHOS 65 57  BILITOT 0.3 0.3     No results found.  Lab Results  Component Value Date   KPAFRELGTCHN 23.2 (H) 12/29/2021   LAMBDASER 18.6 12/29/2021   KAPLAMBRATIO 1.25 12/29/2021     Monoclonal gammopathy # [April-MAY 2023-]-IgM lambda 0.3 g/dL- 0.4 gm/dl   CBC CMP- WNL.   #I again reviewed with the patient the natural history of MGUS; small risk of progression to multiple myeloma/lymphomas etc. Patient is less likely at this time patient has any active myeloma/lymphoma given lack of any clinically concerns at this time.  We will hold off any further work-up including bone marrow biopsy or any imaging at this time.   #Arthritis-?  Osteoarthritis defer to rheumatology. On celebrex.   # DISPOSITION: # follow up in 6 months-  MD; 10 days PRIOR-  labs-cbc/cmp; LDH; MM panel; K/L light chains- Dr.B  All questions were answered. The patient knows to call the clinic with any problems, questions or concerns.   Cammie Sickle, MD 01/21/2022 3:31 PM

## 2022-01-21 NOTE — Assessment & Plan Note (Addendum)
# [  April-MAY 2023-]-IgM lambda 0.3 g/dL- 0.4 gm/dl   CBC CMP- WNL.   #I again reviewed with the patient the natural history of MGUS; small risk of progression to multiple myeloma/lymphomas etc. Patient is less likely at this time patient has any active myeloma/lymphoma given lack of any clinically concerns at this time.  We will hold off any further work-up including bone marrow biopsy or any imaging at this time.   #Arthritis-?  Osteoarthritis defer to rheumatology. On celebrex.   # DISPOSITION: # follow up in 6 months-  MD; 10 days PRIOR-  labs-cbc/cmp; LDH; MM panel; K/L light chains- Dr.B

## 2022-01-21 NOTE — Progress Notes (Signed)
Patient has difficulty sleeping that is getting worse.  Has tried Melatonin and vitamin calm with no help.

## 2022-01-28 ENCOUNTER — Other Ambulatory Visit: Payer: Self-pay | Admitting: Primary Care

## 2022-01-28 DIAGNOSIS — K219 Gastro-esophageal reflux disease without esophagitis: Secondary | ICD-10-CM

## 2022-01-28 DIAGNOSIS — M1990 Unspecified osteoarthritis, unspecified site: Secondary | ICD-10-CM

## 2022-01-28 DIAGNOSIS — G2581 Restless legs syndrome: Secondary | ICD-10-CM

## 2022-02-14 ENCOUNTER — Ambulatory Visit (INDEPENDENT_AMBULATORY_CARE_PROVIDER_SITE_OTHER): Payer: Medicare Other

## 2022-02-14 VITALS — Ht 64.0 in | Wt 164.0 lb

## 2022-02-14 DIAGNOSIS — Z0000 Encounter for general adult medical examination without abnormal findings: Secondary | ICD-10-CM | POA: Diagnosis not present

## 2022-02-14 DIAGNOSIS — Z13820 Encounter for screening for osteoporosis: Secondary | ICD-10-CM | POA: Diagnosis not present

## 2022-02-14 DIAGNOSIS — Z1231 Encounter for screening mammogram for malignant neoplasm of breast: Secondary | ICD-10-CM | POA: Diagnosis not present

## 2022-02-14 NOTE — Progress Notes (Addendum)
Subjective:   April Nguyen is a 71 y.o. female who presents for Medicare Annual (Subsequent) preventive examination.  Review of Systems    Virtual Visit via Telephone Note  I connected with  April Nguyen on 02/17/22 at  1:45 PM EDT by telephone and verified that I am speaking with the correct person using two identifiers.  Location: Patient: Home Provider: Office Persons participating in the virtual visit: patient/Nurse Health Advisor   I discussed the limitations, risks, security and privacy concerns of performing an evaluation and management service by telephone and the availability of in person appointments. The patient expressed understanding and agreed to proceed.  Interactive audio and video telecommunications were attempted between this nurse and patient, however failed, due to patient having technical difficulties OR patient did not have access to video capability.  We continued and completed visit with audio only.  Some vital signs may be absent or patient reported.   Criselda Peaches, LPN  Cardiac Risk Factors include: advanced age (>19mn, >>42women)     Objective:    Today's Vitals   02/14/22 1348  Weight: 164 lb (74.4 kg)  Height: '5\' 4"'$  (1.626 m)   Body mass index is 28.15 kg/m.     02/14/2022    2:00 PM 01/21/2022    2:44 PM 12/29/2021    1:47 PM 08/28/2019    7:06 AM 03/20/2019    5:36 PM 07/21/2017    5:22 PM 05/13/2015    8:12 AM  Advanced Directives  Does Patient Have a Medical Advance Directive? No No No No No No No  Would patient like information on creating a medical advance directive? No - Patient declined No - Patient declined No - Patient declined No - Patient declined No - Patient declined No - Patient declined No - patient declined information    Current Medications (verified) Outpatient Encounter Medications as of 02/14/2022  Medication Sig   celecoxib (CELEBREX) 100 MG capsule Take 200 mg by mouth 2 (two) times daily.   cetirizine  (ZYRTEC) 10 MG tablet Take 1 tablet (10 mg total) by mouth daily. (Patient taking differently: Take 10 mg by mouth daily. am)   estradiol (ESTRACE) 0.1 MG/GM vaginal cream APPLY 2 TO 3 TIMES WEEKLY   fluticasone (FLONASE) 50 MCG/ACT nasal spray Place 2 sprays into both nostrils daily.   gabapentin (NEURONTIN) 300 MG capsule Take 1 capsule (300 mg total) by mouth at bedtime. For restless legs. Office visit required in late July for further refills.   hydrochlorothiazide (HYDRODIURIL) 25 MG tablet TAKE 1 TABLET BY MOUTH ONCE A DAY   hydrOXYzine (ATARAX/VISTARIL) 10 MG tablet Take 1-2 tablets by mouth twice daily as needed for long car rides.   meclizine (ANTIVERT) 25 MG tablet Take 1 tablet (25 mg total) by mouth 3 (three) times daily as needed for dizziness (take as needed not with other antihistimines).   meloxicam (MOBIC) 15 MG tablet Take 1 tablet (15 mg total) by mouth daily. As needed for pain.   omeprazole (PRILOSEC) 20 MG capsule Take 1 capsule (20 mg total) by mouth daily. For heartburn. Office visit required in late July for further refills.   PARoxetine (PAXIL) 20 MG tablet Take 1 tablet (20 mg total) by mouth daily. For anxiety.   predniSONE (DELTASONE) 20 MG tablet Take two tablets daily for 3 days followed by one tablet daily for 4 days   rosuvastatin (CRESTOR) 10 MG tablet Take 1 tablet (10 mg total) by mouth daily. For  cholesterol.   No facility-administered encounter medications on file as of 02/14/2022.    Allergies (verified) Septra [sulfamethoxazole-trimethoprim]   History: Past Medical History:  Diagnosis Date   Allergy    Seasonal   Anxiety    Arthritis    neck, wrists,hands, toes, and feet   Depression    Eustachian tube dysfunction    Family history of adverse reaction to anesthesia    brother - PONV   Genital warts    In past   GERD (gastroesophageal reflux disease)    History of shingles 04/16/2015   March 2016    history of Tumor of parotid gland     Meniere's disease    slight hearing loss, car sickness and dizziness   Migraines    sinus/stress   PONV (postoperative nausea and vomiting)    and headaches   Seizures ([place])    Dx 16 yrs ago - hormonal - none at least 3 yrs   Seizures ([place])    H/O due to hormone issues   Past Surgical History:  Procedure Laterality Date   ABDOMINAL HYSTERECTOMY     CHOLECYSTECTOMY  2008   dialation and cartarage  1982, 1999, [place]  2011, 2012   Bone fusion of middle finger right and left hands   FINGER SURGERY  2013   Pin removed from left middle finger   GANGLION CYST EXCISION  2011, 2012   Left and right hands   MYRINGOTOMY WITH TUBE PLACEMENT Bilateral 05/13/2015   Procedure: MYRINGOTOMY WITH  BUTTERFLY TUBE PLACEMENT;  Surgeon: Carloyn Manner, MD;  Location: [facility];  Service: ENT;  Laterality: Bilateral;   MYRINGOTOMY WITH TUBE PLACEMENT Bilateral 08/28/2019   Procedure: MYRINGOTOMY WITH BUTTERFLY TUBE PLACEMENT;  Surgeon: Carloyn Manner, MD;  Location: [facility];  Service: ENT;  Laterality: Bilateral;   PAROTID GLAND TUMOR [place]  2010   Family History  Problem Relation Age of Onset   Arthritis Mother    Hyperlipidemia Mother    Hypertension Mother    Stroke Mother    Heart disease Mother    Diabetes Mother    Hyperlipidemia Father    Heart disease Father    Arthritis Brother    Throat cancer Maternal Grandfather    Breast cancer Neg Hx    Social History   Socioeconomic History   Marital status: Married    Spouse name: Not on file   Number of children: Not on file   Years of education: Not on file   Highest education level: Not on file  Occupational History   Not on file  Tobacco Use   Smoking status: Never   Smokeless tobacco: Never  Vaping Use   Vaping Use: Never used  Substance and Sexual Activity   Alcohol use: No    Alcohol/week: 0.0 standard drinks of  alcohol   Drug use: No   Sexual activity: Yes    Partners: Male    Comment: Husband  Other Topics Concern   Not on file  Social History Narrative   Work at the [employer] at [employer].  Lives with husband with [place]. Children- daughter and son;no smoking or alcohol.      -------------------------------------------------------------------------------    Grandchildren- 6    Caffeine- 1 cup in the morning, soda occasionally, green tea hot/cold   Enjoys reading, spending time at beach, spending time with family.   Social Determinants  of Health   Financial Resource Strain: Low Risk  (02/14/2022)   Overall Financial Resource Strain (CARDIA)    Difficulty of Paying Living Expenses: Not hard at all  Food Insecurity: No Food Insecurity (02/14/2022)   Hunger Vital Sign    Worried About Running Out of Food in the Last Year: Never true    Ran Out of Food in the Last Year: Never true  Transportation Needs: No Transportation Needs (02/14/2022)   PRAPARE - Hydrologist (Medical): No    Lack of Transportation (Non-Medical): No  Physical Activity: Insufficiently Active (02/14/2022)   Exercise Vital Sign    Days of Exercise per Week: 3 days    Minutes of Exercise per Session: 20 min  Stress: No Stress Concern Present (02/14/2022)   [place]    Feeling of Stress : Not at all  Social Connections: Moderately Isolated (02/14/2022)   Social Connection and Isolation Panel [NHANES]    Frequency of Communication with Friends and Family: More than three times a week    Frequency of Social Gatherings with Friends and Family: More than three times a week    Attends Religious Services: Never    Marine scientist or Organizations: No    Attends Archivist Meetings: Never    Marital Status: Married     Clinical Intake:  Pre-visit preparation completed: No  Diabetic?   No    Interpreter Needed?: No Activities of Daily Living    02/14/2022    1:59 PM  In your present state of health, do you have any difficulty performing the following activities:  Hearing? 0  Vision? 0  Difficulty concentrating or making decisions? 0  Walking or climbing stairs? 0  Dressing or bathing? 0  Doing errands, shopping? 0  Preparing Food and eating ? N  Using the Toilet? N  In the past six months, have you accidently leaked urine? N  Do you have problems with loss of bowel control? N  Managing your Medications? N  Managing your Finances? N  Housekeeping or managing your Housekeeping? N    Patient Care Team: Pleas Koch, NP as PCP - General (Internal Medicine) Cammie Sickle, MD as Consulting Physician (Oncology)  Indicate any recent Medical Services you may have received from other than [facility] providers in the past year (date may be approximate).     Assessment:   This is a routine wellness examination for Kaytelyn.  Hearing/Vision screen Hearing Screening - Comments:: Menieres disease  Dietary issues and exercise activities discussed: Exercise limited by: None identified   Goals Addressed               This Visit's Progress     Increase physical activity (pt-stated)         Depression Screen    02/14/2022    1:54 PM 03/09/2021    8:57 AM 02/17/2017   10:37 AM  PHQ 2/9 Scores  PHQ - 2 Score 0 0 0    Fall Risk    02/14/2022    1:59 PM 03/09/2021    8:58 AM  [place] in the past year? 0 0  Number falls in past yr: 0 0  Injury with Fall? 0 0  Risk for fall due to : No Fall Risks     FALL RISK PREVENTION PERTAINING TO THE HOME:  Any stairs in or around the home? No If so,  are there any without handrails? No  Home free of loose throw rugs in walkways, pet beds, electrical cords, etc? Yes  Adequate lighting in your home to reduce risk of falls? Yes   ASSISTIVE DEVICES UTILIZED TO PREVENT FALLS:  Life alert? No  Use of  a cane, walker or w/c? No  Grab bars in the bathroom? Yes  Shower chair or bench in shower? No Elevated toilet seat or a handicapped toilet? No   TIMED UP AND GO:  Was the test performed? No . Audio Visit  Cognitive Function:  6CIT completed with score of 0   Immunizations Immunization History  Administered Date(s) Administered   Influenza,inj,Quad PF,6+ Mos 05/28/2019   Moderna Sars-Covid-2 Vaccination 09/13/2019, 10/04/2019   Pneumococcal Conjugate-13 03/31/2020   Pneumococcal Polysaccharide-23 02/26/2019   Td 02/17/2017   Zoster, Live 10/26/2015    TDAP status: Up to date  Flu Vaccine status: Up to date  Pneumococcal vaccine status: Up to date  Covid-19 vaccine status: Completed vaccines  Qualifies for Shingles Vaccine? Yes   Zostavax completed No   Shingrix Completed?: No.    Education has been provided regarding the importance of this vaccine. Patient has been advised to call insurance company to determine out of pocket expense if they have not yet received this vaccine. Advised may also receive vaccine at local pharmacy or Health Dept. Verbalized acceptance and understanding.  Screening Tests Health Maintenance  Topic Date Due   MAMMOGRAM  04/03/2020   Zoster Vaccines- Shingrix (1 of 2) 05/17/2022 (Originally 07/15/1970)   INFLUENZA VACCINE  04/05/2022   COLONOSCOPY (Pts 45-62yr Insurance coverage will need to be confirmed)  09/10/2022   TETANUS/TDAP  02/18/2027   Pneumonia Vaccine 71 Years old  Completed   DEXA SCAN  Completed   Hepatitis C Screening  Completed   HPV VACCINES  Aged Out   COVID-19 Vaccine  Discontinued    Health Maintenance  Health Maintenance Due  Topic Date Due   MAMMOGRAM  04/03/2020    Colorectal cancer screening: Type of screening: Colonoscopy. Completed 09/10/12. Repeat every 10 years  Mammogram status: Ordered 02/14/22. Pt provided with contact info and advised to call to schedule appt.   Bone Density status: Ordered 02/14/22.  Pt provided with contact info and advised to call to schedule appt.  Lung Cancer Screening: (Low Dose CT Chest recommended if Age 10987-80years, 30 pack-year currently smoking OR have quit w/in 15years.) does not qualify.     Additional Screening:  Hepatitis C Screening: does qualify; Completed 02/09/19  Vision Screening: Recommended annual ophthalmology exams for early detection of glaucoma and other disorders of the eye. Is the patient up to date with their annual eye exam?  Yes  Who is the provider or what is the name of the office in which the patient attends annual eye exams? Dr NMatilde SprangIf pt is not established with a provider, would they like to be referred to a provider to establish care? No .   Dental Screening: Recommended annual dental exams for proper oral hygiene  Community Resource Referral / Chronic Care Management:  CRR required this visit?  No   CCM required this visit?  No      Plan:     I have personally reviewed and noted the following in the patient's chart:   Medical and social history Use of alcohol, tobacco or illicit drugs  Current medications and supplements including opioid prescriptions.  Functional ability and status Nutritional status Physical activity Advanced directives List of  other physicians Hospitalizations, surgeries, and ER visits in previous 12 months Vitals Screenings to include cognitive, depression, and falls Referrals and appointments  In addition, I have reviewed and discussed with patient certain preventive protocols, quality metrics, and best practice recommendations. A written personalized care plan for preventive services as well as general preventive health recommendations were provided to patient.     Criselda Peaches, LPN   [id number]/68/[id number]   Nurse Notes: 6CIT completed in encounter with score of 0

## 2022-02-14 NOTE — Patient Instructions (Addendum)
April Nguyen , Thank you for taking time to come for your Medicare Wellness Visit. I appreciate your ongoing commitment to your health goals. Please review the following plan we discussed and let me know if I can assist you in the future.   These are the goals we discussed:  Goals       Increase physical activity (pt-stated)        This is a list of the screening recommended for you and due dates:  Health Maintenance  Topic Date Due   Mammogram  04/03/2020   Zoster (Shingles) Vaccine (1 of 2) 05/17/2022*   Flu Shot  04/05/2022   Colon Cancer Screening  09/10/2022   Tetanus Vaccine  02/18/2027   Pneumonia Vaccine  Completed   DEXA scan (bone density measurement)  Completed   Hepatitis C Screening: USPSTF Recommendation to screen - Ages 62-79 yo.  Completed   HPV Vaccine  Aged Out   COVID-19 Vaccine  Discontinued  *Topic was postponed. The date shown is not the original due date.     Advanced directives: No  Conditions/risks identified: None  Next appointment: Follow up in one year for your annual wellness visit    Preventive Care 65 Years and Older, Female Preventive care refers to lifestyle choices and visits with your health care provider that can promote health and wellness. What does preventive care include? A yearly physical exam. This is also called an annual well check. Dental exams once or twice a year. Routine eye exams. Ask your health care provider how often you should have your eyes checked. Personal lifestyle choices, including: Daily care of your teeth and gums. Regular physical activity. Eating a healthy diet. Avoiding tobacco and drug use. Limiting alcohol use. Practicing safe sex. Taking low-dose aspirin every day. Taking vitamin and mineral supplements as recommended by your health care provider. What happens during an annual well check? The services and screenings done by your health care provider during your annual well check will depend on your age,  overall health, lifestyle risk factors, and family history of disease. Counseling  Your health care provider may ask you questions about your: Alcohol use. Tobacco use. Drug use. Emotional well-being. Home and relationship well-being. Sexual activity. Eating habits. History of falls. Memory and ability to understand (cognition). Work and work Statistician. Reproductive health. Screening  You may have the following tests or measurements: Height, weight, and BMI. Blood pressure. Lipid and cholesterol levels. These may be checked every 5 years, or more frequently if you are over 68 years old. Skin check. Lung cancer screening. You may have this screening every year starting at age 92 if you have a 30-pack-year history of smoking and currently smoke or have quit within the past 15 years. Fecal occult blood test (FOBT) of the stool. You may have this test every year starting at age 81. Flexible sigmoidoscopy or colonoscopy. You may have a sigmoidoscopy every 5 years or a colonoscopy every 10 years starting at age 80. Hepatitis C blood test. Hepatitis B blood test. Sexually transmitted disease (STD) testing. Diabetes screening. This is done by checking your blood sugar (glucose) after you have not eaten for a while (fasting). You may have this done every 1-3 years. Bone density scan. This is done to screen for osteoporosis. You may have this done starting at age 27. Mammogram. This may be done every 1-2 years. Talk to your health care provider about how often you should have regular mammograms. Talk with your health care provider  about your test results, treatment options, and if necessary, the need for more tests. Vaccines  Your health care provider may recommend certain vaccines, such as: Influenza vaccine. This is recommended every year. Tetanus, diphtheria, and acellular pertussis (Tdap, Td) vaccine. You may need a Td booster every 10 years. Zoster vaccine. You may need this after age  61. Pneumococcal 13-valent conjugate (PCV13) vaccine. One dose is recommended after age 18. Pneumococcal polysaccharide (PPSV23) vaccine. One dose is recommended after age 22. Talk to your health care provider about which screenings and vaccines you need and how often you need them. This information is not intended to replace advice given to you by your health care provider. Make sure you discuss any questions you have with your health care provider. Document Released: 09/18/2015 Document Revised: 05/11/2016 Document Reviewed: 06/23/2015 Elsevier Interactive Patient Education  2017 [place] Prevention in the Home Falls can cause injuries. They can happen to people of all ages. There are many things you can do to make your home safe and to help prevent falls. What can I do on the outside of my home? Regularly fix the edges of walkways and driveways and fix any cracks. Remove anything that might make you trip as you walk through a door, such as a raised step or threshold. Trim any bushes or trees on the path to your home. Use bright outdoor lighting. Clear any walking paths of anything that might make someone trip, such as rocks or tools. Regularly check to see if handrails are loose or broken. Make sure that both sides of any steps have handrails. Any raised decks and porches should have guardrails on the edges. Have any leaves, snow, or ice cleared regularly. Use sand or salt on walking paths during winter. Clean up any spills in your garage right away. This includes oil or grease spills. What can I do in the bathroom? Use night lights. Install grab bars by the toilet and in the tub and shower. Do not use towel bars as grab bars. Use non-skid mats or decals in the tub or shower. If you need to sit down in the shower, use a plastic, non-slip stool. Keep the floor dry. Clean up any water that spills on the floor as soon as it happens. Remove soap buildup in the tub or shower  regularly. Attach bath mats securely with double-sided non-slip rug tape. Do not have throw rugs and other things on the floor that can make you trip. What can I do in the bedroom? Use night lights. Make sure that you have a light by your bed that is easy to reach. Do not use any sheets or blankets that are too big for your bed. They should not hang down onto the floor. Have a firm chair that has side arms. You can use this for support while you get dressed. Do not have throw rugs and other things on the floor that can make you trip. What can I do in the kitchen? Clean up any spills right away. Avoid walking on wet floors. Keep items that you use a lot in easy-to-reach places. If you need to reach something above you, use a strong step stool that has a grab bar. Keep electrical cords out of the way. Do not use floor polish or wax that makes floors slippery. If you must use wax, use non-skid floor wax. Do not have throw rugs and other things on the floor that can make you trip. What can I do  with my stairs? Do not leave any items on the stairs. Make sure that there are handrails on both sides of the stairs and use them. Fix handrails that are broken or loose. Make sure that handrails are as long as the stairways. Check any carpeting to make sure that it is firmly attached to the stairs. Fix any carpet that is loose or worn. Avoid having throw rugs at the top or bottom of the stairs. If you do have throw rugs, attach them to the floor with carpet tape. Make sure that you have a light switch at the top of the stairs and the bottom of the stairs. If you do not have them, ask someone to add them for you. What else can I do to help prevent falls? Wear shoes that: Do not have high heels. Have rubber bottoms. Are comfortable and fit you well. Are closed at the toe. Do not wear sandals. If you use a stepladder: Make sure that it is fully opened. Do not climb a closed stepladder. Make sure that  both sides of the stepladder are locked into place. Ask someone to hold it for you, if possible. Clearly mark and make sure that you can see: Any grab bars or handrails. First and last steps. Where the edge of each step is. Use tools that help you move around (mobility aids) if they are needed. These include: Canes. Walkers. Scooters. Crutches. Turn on the lights when you go into a dark area. Replace any light bulbs as soon as they burn out. Set up your furniture so you have a clear path. Avoid moving your furniture around. If any of your floors are uneven, fix them. If there are any pets around you, be aware of where they are. Review your medicines with your doctor. Some medicines can make you feel dizzy. This can increase your chance of falling. Ask your doctor what other things that you can do to help prevent falls. This information is not intended to replace advice given to you by your health care provider. Make sure you discuss any questions you have with your health care provider. Document Released: 06/18/2009 Document Revised: 01/28/2016 Document Reviewed: 09/26/2014 Elsevier Interactive Patient Education  2017 [employer].

## 2022-02-15 NOTE — Addendum Note (Signed)
Addended by: Criselda Peaches on: 02/15/2022 03:40 PM   Modules accepted: Orders

## 2022-03-02 DIAGNOSIS — F418 Other specified anxiety disorders: Secondary | ICD-10-CM

## 2022-03-02 MED ORDER — PAROXETINE HCL 20 MG PO TABS: 20.0000 mg | ORAL_TABLET | Freq: Every day | ORAL | 0 refills | Status: DC

## 2022-03-03 ENCOUNTER — Other Ambulatory Visit: Payer: Self-pay | Admitting: Primary Care

## 2022-03-03 DIAGNOSIS — N951 Menopausal and female climacteric states: Secondary | ICD-10-CM

## 2022-03-30 ENCOUNTER — Other Ambulatory Visit: Payer: Self-pay | Admitting: Primary Care

## 2022-03-30 ENCOUNTER — Encounter: Payer: Self-pay | Admitting: Primary Care

## 2022-03-30 ENCOUNTER — Ambulatory Visit (INDEPENDENT_AMBULATORY_CARE_PROVIDER_SITE_OTHER): Payer: Medicare Other | Admitting: Primary Care

## 2022-03-30 VITALS — BP 124/66 | HR 95 | Temp 98.7°F | Ht 64.0 in | Wt 157.0 lb

## 2022-03-30 DIAGNOSIS — H8109 Meniere's disease, unspecified ear: Secondary | ICD-10-CM | POA: Diagnosis not present

## 2022-03-30 DIAGNOSIS — R7303 Prediabetes: Secondary | ICD-10-CM | POA: Diagnosis not present

## 2022-03-30 DIAGNOSIS — F418 Other specified anxiety disorders: Secondary | ICD-10-CM

## 2022-03-30 DIAGNOSIS — E785 Hyperlipidemia, unspecified: Secondary | ICD-10-CM | POA: Diagnosis not present

## 2022-03-30 DIAGNOSIS — D472 Monoclonal gammopathy: Secondary | ICD-10-CM | POA: Diagnosis not present

## 2022-03-30 DIAGNOSIS — G2581 Restless legs syndrome: Secondary | ICD-10-CM

## 2022-03-30 DIAGNOSIS — E2839 Other primary ovarian failure: Secondary | ICD-10-CM

## 2022-03-30 DIAGNOSIS — Z0000 Encounter for general adult medical examination without abnormal findings: Secondary | ICD-10-CM

## 2022-03-30 DIAGNOSIS — K219 Gastro-esophageal reflux disease without esophagitis: Secondary | ICD-10-CM | POA: Diagnosis not present

## 2022-03-30 DIAGNOSIS — M542 Cervicalgia: Secondary | ICD-10-CM | POA: Diagnosis not present

## 2022-03-30 DIAGNOSIS — M1990 Unspecified osteoarthritis, unspecified site: Secondary | ICD-10-CM

## 2022-03-30 DIAGNOSIS — Z1231 Encounter for screening mammogram for malignant neoplasm of breast: Secondary | ICD-10-CM | POA: Diagnosis not present

## 2022-03-30 DIAGNOSIS — G8929 Other chronic pain: Secondary | ICD-10-CM

## 2022-03-30 DIAGNOSIS — Z79890 Hormone replacement therapy (postmenopausal): Secondary | ICD-10-CM

## 2022-03-30 LAB — LIPID PANEL
Cholesterol: 236 mg/dL — ABNORMAL HIGH (ref 0–200)
HDL: 55.8 mg/dL (ref >39.00)
LDL Cholesterol: 157 mg/dL — ABNORMAL HIGH (ref 0–99)
NonHDL: 180.14
Total CHOL/HDL Ratio: 4
Triglycerides: 114 mg/dL (ref 0.0–149.0)
VLDL: 22.8 mg/dL (ref 0.0–40.0)

## 2022-03-30 LAB — HEMOGLOBIN A1C: Hgb A1c MFr Bld: 6.1 % (ref 4.6–6.5)

## 2022-03-30 MED ORDER — GABAPENTIN 300 MG PO CAPS: 600.0000 mg | ORAL_CAPSULE | Freq: Every day | ORAL | 0 refills | Status: DC

## 2022-03-30 NOTE — Assessment & Plan Note (Signed)
Continue rosuvastatin 10 mg daily. ?Repeat lipid panel pending. ?

## 2022-03-30 NOTE — Assessment & Plan Note (Signed)
Controlled.   Continue omeprazole 20 mg daily. 

## 2022-03-30 NOTE — Assessment & Plan Note (Addendum)
Evaluated by rheumatology and physiatry recently.  Negative for autoimmune disease.   She may proceed with cervical spine injections.  Continue Celebrex 200 mg HS. Also increasing gabapentin to 600 mg HS which may help.

## 2022-03-30 NOTE — Assessment & Plan Note (Signed)
Immunizations UTD. Discussed getting the new Shingrix vaccines from pharmacy. Mammogram overdue, orders placed. Bone density scan due, orders placed. Colonoscopy due in 2024.  Discussed the importance of a healthy diet and regular exercise in order for weight loss, and to reduce the risk of further co-morbidity.  Exam stable. Labs pending.  Follow up in 1 year for repeat physical.

## 2022-03-30 NOTE — Progress Notes (Signed)
Subjective:    Patient ID: April Nguyen, female    DOB: 12-23-1950, 70 y.o.   MRN: [id number]  HPI  April Nguyen is a very pleasant 71 y.o. female who presents today for complete physical and follow up of chronic conditions.  She would like to mention restless legs. She continues to notice restless legs every night. Symptoms begin with bilateral ankle itching, she then notices movement of her legs, tries to distract herself. Once she's in bed she notices a constant moving of her legs. She is managed on gabapentin 300 mg HS, she also takes Benadryl. She did notice an improvement initially with gabapentin, doesn't feel this is effective any longer.   Immunizations: -Tetanus: 2018 -Influenza: Did not complete last season -Covid-19: 2 vaccines -Shingles: Zostavax in 2017, she will obtain from pharmacy.  -Pneumonia: Prevnar 13 in 2021. Pneumovax in 2020   Diet: [place].  Exercise: Walking   Eye exam: Completes annually  Dental exam: Completes semi-annually   Mammogram: Completed in 2019  Colonoscopy: Completed in 2014, due 2024 Dexa: Completed in 2019  BP Readings from Last 3 Encounters:  03/30/22 124/66  01/21/22 124/69  12/29/21 135/83       Review of Systems  Constitutional:  Negative for unexpected weight change.  HENT:  Negative for rhinorrhea.   Respiratory:  Negative for cough and shortness of breath.   Cardiovascular:  Negative for chest pain.  Gastrointestinal:  Negative for constipation and diarrhea.  Genitourinary:  Negative for difficulty urinating and menstrual problem.  Musculoskeletal:  Negative for arthralgias and myalgias.  Skin:  Negative for rash.  Allergic/Immunologic: Negative for environmental allergies.  Neurological:  Negative for dizziness and headaches.       Restless legs         Past Medical History:  Diagnosis Date   Allergy    Seasonal   Anxiety    Arthritis    neck, wrists,hands, toes, and feet   COVID-19 09/08/2021    Depression    Eustachian tube dysfunction    Family history of adverse reaction to anesthesia    brother - PONV   Genital warts    In past   GERD (gastroesophageal reflux disease)    History of shingles 04/16/2015   March 2016    history of Tumor of parotid gland    Meniere's disease    slight hearing loss, car sickness and dizziness   Migraines    sinus/stress   PONV (postoperative nausea and vomiting)    and headaches   Seizures ([place])    Dx 16 yrs ago - hormonal - none at least 3 yrs   Seizures ([place])    H/O due to hormone issues    Social History   Socioeconomic History   Marital status: Married    Spouse name: Not on file   Number of children: Not on file   Years of education: Not on file   Highest education level: Not on file  Occupational History   Not on file  Tobacco Use   Smoking status: Never   Smokeless tobacco: Never  Vaping Use   Vaping Use: Never used  Substance and Sexual Activity   Alcohol use: No    Alcohol/week: 0.0 standard drinks of alcohol   Drug use: No   Sexual activity: Yes    Partners: Male    Comment: Husband  Other Topics Concern   Not on file  Social History Narrative   Work at the [employer] at  [place] .  Lives with husband with [place]. Children- daughter and son;no smoking or alcohol.      -------------------------------------------------------------------------------    Grandchildren- 6    Caffeine- 1 cup in the morning, soda occasionally, green tea hot/cold   Enjoys reading, spending time at beach, spending time with family.   Social Determinants of Health   Financial Resource Strain: Low Risk  (02/14/2022)   Overall Financial Resource Strain (CARDIA)    Difficulty of Paying Living Expenses: Not hard at all  Food Insecurity: No Food Insecurity (02/14/2022)   Hunger Vital Sign    Worried About Running Out of Food in the Last Year: Never true    Ran Out of Food in the Last Year: Never true  Transportation  Needs: No Transportation Needs (02/14/2022)   PRAPARE - Hydrologist (Medical): No    Lack of Transportation (Non-Medical): No  Physical Activity: Insufficiently Active (02/14/2022)   Exercise Vital Sign    Days of Exercise per Week: 3 days    Minutes of Exercise per Session: 20 min  Stress: No Stress Concern Present (02/14/2022)   [place]    Feeling of Stress : Not at all  Social Connections: Moderately Isolated (02/14/2022)   Social Connection and Isolation Panel [NHANES]    Frequency of Communication with Friends and Family: More than three times a week    Frequency of Social Gatherings with Friends and Family: More than three times a week    Attends Religious Services: Never    Marine scientist or Organizations: No    Attends Archivist Meetings: Never    Marital Status: Married  Human resources officer Violence: Not At Risk (02/14/2022)   Humiliation, Afraid, Rape, and Kick questionnaire    Fear of Current or Ex-Partner: No    Emotionally Abused: No    Physically Abused: No    Sexually Abused: No    Past Surgical History:  Procedure Laterality Date   ABDOMINAL HYSTERECTOMY     CHOLECYSTECTOMY  2008   dialation and cartarage  1982, 1999, [place]  2011, 2012   Bone fusion of middle finger right and left hands   FINGER SURGERY  2013   Pin removed from left middle finger   GANGLION CYST EXCISION  2011, 2012   Left and right hands   MYRINGOTOMY WITH TUBE PLACEMENT Bilateral 05/13/2015   Procedure: MYRINGOTOMY WITH  BUTTERFLY TUBE PLACEMENT;  Surgeon: Carloyn Manner, MD;  Location: [facility];  Service: ENT;  Laterality: Bilateral;   MYRINGOTOMY WITH TUBE PLACEMENT Bilateral 08/28/2019   Procedure: MYRINGOTOMY WITH BUTTERFLY TUBE PLACEMENT;  Surgeon: Carloyn Manner, MD;  Location: [facility];  Service: ENT;  Laterality: Bilateral;   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     cetirizine (ZYRTEC) 10 MG tablet Take 1 tablet (10 mg total) by mouth daily. (Patient taking differently: Take 10 mg by mouth daily. am) 30 tablet 11   estradiol (ESTRACE) 0.1 MG/GM vaginal cream APPLY 2 TO 3 TIMES WEEKLY 42.5 g 0   fluticasone (FLONASE) 50 MCG/ACT nasal spray Place 2 sprays into both nostrils daily. 16 g 1   hydrochlorothiazide (HYDRODIURIL) 25 MG tablet TAKE 1 TABLET BY MOUTH ONCE A DAY 90 tablet 3   hydrOXYzine (ATARAX/VISTARIL) 10 MG tablet Take 1-2 tablets by mouth twice daily as needed for long car rides. 30 tablet 0   meclizine (ANTIVERT) 25 MG tablet Take 1 tablet (25 mg total) by mouth 3 (three) times daily as needed for dizziness (take as needed not with other antihistimines). 30 tablet 0   omeprazole (PRILOSEC) 20 MG capsule Take 1 capsule (20 mg total) by mouth daily. For heartburn. Office visit required in late July for further refills. 90 capsule 0   PARoxetine (PAXIL) 20 MG tablet Take 1 tablet (20 mg total) by mouth daily. For anxiety. Office visit required for further refills. 90 tablet 0   rosuvastatin (CRESTOR) 10 MG tablet Take 1 tablet (10 mg total) by mouth daily. For cholesterol. 90 tablet 3   No current facility-administered medications on file prior to visit.    BP 124/66   Pulse 95   Temp 98.7  F (37.1 C) (Oral)   Ht '5\' 4"'$  (1.626 m)   Wt 157 lb (71.2 kg)   SpO2 97%   BMI 26.95 kg/m  Objective:   Physical Exam HENT:     Right Ear: Tympanic membrane and ear canal normal.     Left Ear: Tympanic membrane and ear canal normal.     Nose: Nose normal.  Eyes:     Conjunctiva/sclera: Conjunctivae normal.     Pupils: Pupils are equal, round, and reactive to light.  Neck:     Thyroid: No thyromegaly.  Cardiovascular:     Rate and Rhythm: Normal rate and regular rhythm.     Heart sounds: No murmur heard. Pulmonary:     Effort: Pulmonary effort is normal.     Breath sounds: Normal breath sounds. No rales.  Abdominal:     General: Bowel sounds are normal.     Palpations: Abdomen is soft.     Tenderness: There is no abdominal tenderness.  Musculoskeletal:        General: Normal range of motion.     Cervical back: Neck supple.  Lymphadenopathy:     Cervical: No cervical adenopathy.  Skin:    General: Skin is warm and dry.     Findings: No rash.  Neurological:     Mental Status: She is alert and oriented to person, place, and time.     Cranial Nerves: No cranial nerve deficit.     Deep Tendon Reflexes: Reflexes are normal and symmetric.  Psychiatric:        Mood and Affect: Mood normal.           Assessment & Plan:   Problem List Items Addressed This Visit       Digestive   GERD (gastroesophageal reflux disease)    Controlled.  Continue omeprazole 20 mg daily.        Nervous and Auditory   Meniere disease    Controlled.  Continue HCTZ 25 mg daily. CMP reviewed  from April 2023.          Musculoskeletal and Integument   Arthritis    Evaluated by rheumatology and physiatry recently.  Negative for autoimmune disease.   She may proceed with cervical spine injections.  Continue Celebrex 200 mg HS. Also increasing gabapentin to 600 mg HS which may help.      Relevant Medications   gabapentin (NEURONTIN) 300 MG capsule     Other   Depression  with anxiety    Controlled.  Continue Paxil 20 mg daily. Continue hydroxyzine 10 mg PRN.  Continue to monitor.       Preventative health care - Primary    Immunizations UTD. Discussed getting the new Shingrix vaccines from pharmacy. Mammogram overdue, orders placed. Bone density scan due, orders placed. Colonoscopy due in 2024.  Discussed the importance of a healthy diet and regular exercise in order for weight loss, and to reduce the risk of further co-morbidity.  Exam stable. Labs pending.  Follow up in 1 year for repeat physical.       Hyperlipidemia    Continue rosuvastatin 10 mg daily. Repeat lipid panel pending.      Relevant Orders   Lipid panel   Hormone replacement therapy (HRT)    Controlled.  Continue Estrace cream 2-3 times weekly       Prediabetes    Repeat A1C pending.  Discussed the importance of a healthy diet and regular exercise in order for weight loss, and to reduce the risk of further co-morbidity.       Relevant Orders   Hemoglobin A1c   Chronic neck pain    Following with physiatry.  She is considering cervical spine injections.       Relevant Medications   gabapentin (NEURONTIN) 300 MG capsule   Restless legs    Uncontrolled.  Increase gabapentin to 600 mg HS. Discouraged use of Benadryl nightly She will update.       Relevant Medications   gabapentin (NEURONTIN) 300 MG capsule   Monoclonal gammopathy    Following with hematology. Office notes and labs reviewed from April 2023.       Other Visit Diagnoses     Encounter for screening mammogram for malignant neoplasm of breast       Relevant Orders   MM 3D SCREEN BREAST BILATERAL   Estrogen deficiency       Relevant Orders   DG Bone Density          Pleas Koch, NP

## 2022-03-30 NOTE — Assessment & Plan Note (Addendum)
Controlled.  Continue Paxil 20 mg daily. Continue hydroxyzine 10 mg PRN.  Continue to monitor.

## 2022-03-30 NOTE — Assessment & Plan Note (Signed)
Repeat A1C pending.  Discussed the importance of a healthy diet and regular exercise in order for weight loss, and to reduce the risk of further co-morbidity.  

## 2022-03-30 NOTE — Assessment & Plan Note (Addendum)
Uncontrolled.  Increase gabapentin to 600 mg HS. Discouraged use of Benadryl nightly She will update.

## 2022-03-30 NOTE — Assessment & Plan Note (Signed)
Following with hematology. Office notes and labs reviewed from April 2023.

## 2022-03-30 NOTE — Assessment & Plan Note (Signed)
Controlled.  Continue Estrace cream 2-3 times weekly

## 2022-03-30 NOTE — Assessment & Plan Note (Signed)
Following with physiatry.  She is considering cervical spine injections.

## 2022-03-30 NOTE — Assessment & Plan Note (Signed)
Controlled.  Continue HCTZ 25 mg daily. CMP reviewed from April 2023.

## 2022-03-30 NOTE — Patient Instructions (Signed)
We increased the dose of your gabapentin to 600 mg. Take 2 capsules at bedtime. Please update me if no improvement.   [facility] and bone density scan.  Stop by the lab prior to leaving today. I will notify you of your results once received.   It was a pleasure to see you today!  Preventive Care 32 Years and Older, Female Preventive care refers to lifestyle choices and visits with your health care provider that can promote health and wellness. Preventive care visits are also called wellness exams. What can I expect for my preventive care visit? Counseling Your health care provider may ask you questions about your: Medical history, including: Past medical problems. Family medical history. Pregnancy and menstrual history. History of falls. Current health, including: Memory and ability to understand (cognition). Emotional well-being. Home life and relationship well-being. Sexual activity and sexual health. Lifestyle, including: Alcohol, nicotine or tobacco, and drug use. Access to firearms. Diet, exercise, and sleep habits. Work and work Statistician. Sunscreen use. Safety issues such as seatbelt and bike helmet use. Physical exam Your health care provider will check your: Height and weight. These may be used to calculate your BMI (body mass index). BMI is a measurement that tells if you are at a healthy weight. Waist circumference. This measures the distance around your waistline. This measurement also tells if you are at a healthy weight and may help predict your risk of certain diseases, such as type 2 diabetes and high blood pressure. Heart rate and blood pressure. Body temperature. Skin for abnormal spots. What immunizations do I need?  Vaccines are usually given at various ages, according to a schedule. Your health care provider will recommend vaccines for you based on your age, medical history, and lifestyle or other factors, such as  travel or where you work. What tests do I need? Screening Your health care provider may recommend screening tests for certain conditions. This may include: Lipid and cholesterol levels. Hepatitis C test. Hepatitis B test. HIV (human immunodeficiency virus) test. STI (sexually transmitted infection) testing, if you are at risk. Lung cancer screening. Colorectal cancer screening. Diabetes screening. This is done by checking your blood sugar (glucose) after you have not eaten for a while (fasting). Mammogram. Talk with your health care provider about how often you should have regular mammograms. BRCA-related cancer screening. This may be done if you have a family history of breast, ovarian, tubal, or peritoneal cancers. Bone density scan. This is done to screen for osteoporosis. Talk with your health care provider about your test results, treatment options, and if necessary, the need for more tests. Follow these instructions at home: Eating and drinking  Eat a diet that includes fresh fruits and vegetables, whole grains, lean protein, and low-fat dairy products. Limit your intake of foods with high amounts of sugar, saturated fats, and salt. Take vitamin and mineral supplements as recommended by your health care provider. Do not drink alcohol if your health care provider tells you not to drink. If you drink alcohol: Limit how much you have to 0-1 drink a day. Know how much alcohol is in your drink. In the [place] one drink equals one 12 oz bottle of beer (355 mL), one 5 oz glass of wine (148 mL), or one 1 oz glass of hard liquor (44 mL). Lifestyle Brush your teeth every morning and night with fluoride toothpaste. Floss one time each day. Exercise for at least 30 minutes 5 or more days each  week. Do not use any products that contain nicotine or tobacco. These products include cigarettes, chewing tobacco, and vaping devices, such as e-cigarettes. If you need help quitting, ask your health care  provider. Do not use drugs. If you are sexually active, practice safe sex. Use a condom or other form of protection in order to prevent STIs. Take aspirin only as told by your health care provider. Make sure that you understand how much to take and what form to take. Work with your health care provider to find out whether it is safe and beneficial for you to take aspirin daily. Ask your health care provider if you need to take a cholesterol-lowering medicine (statin). Find healthy ways to manage stress, such as: Meditation, yoga, or listening to music. Journaling. Talking to a trusted person. Spending time with friends and family. Minimize exposure to UV radiation to reduce your risk of skin cancer. Safety Always wear your seat belt while driving or riding in a vehicle. Do not drive: If you have been drinking alcohol. Do not ride with someone who has been drinking. When you are tired or distracted. While texting. If you have been using any mind-altering substances or drugs. Wear a helmet and other protective equipment during sports activities. If you have firearms in your house, make sure you follow all gun safety procedures. What's next? Visit your health care provider once a year for an annual wellness visit. Ask your health care provider how often you should have your eyes and teeth checked. Stay up to date on all vaccines. This information is not intended to replace advice given to you by your health care provider. Make sure you discuss any questions you have with your health care provider. Document Revised: 02/17/2021 Document Reviewed: 02/17/2021 Elsevier Patient Education  [place].

## 2022-03-31 ENCOUNTER — Telehealth: Payer: Self-pay | Admitting: Primary Care

## 2022-03-31 NOTE — Telephone Encounter (Signed)
See lab note for further documentation. No further action needed on this call.

## 2022-03-31 NOTE — Telephone Encounter (Signed)
Patient returned a call back about results.

## 2022-04-29 ENCOUNTER — Other Ambulatory Visit: Payer: Self-pay | Admitting: Primary Care

## 2022-04-29 DIAGNOSIS — K219 Gastro-esophageal reflux disease without esophagitis: Secondary | ICD-10-CM

## 2022-04-29 DIAGNOSIS — I10 Essential (primary) hypertension: Secondary | ICD-10-CM

## 2022-05-13 ENCOUNTER — Ambulatory Visit
Admission: RE | Admit: 2022-05-13 | Discharge: 2022-05-13 | Disposition: A | Discharge status: Home / Self Care | Payer: Medicare Other | Source: Ambulatory Visit | Attending: Primary Care | Admitting: Primary Care

## 2022-05-13 DIAGNOSIS — Z1231 Encounter for screening mammogram for malignant neoplasm of breast: Secondary | ICD-10-CM | POA: Diagnosis not present

## 2022-05-13 DIAGNOSIS — Z780 Asymptomatic menopausal state: Secondary | ICD-10-CM | POA: Diagnosis not present

## 2022-05-13 DIAGNOSIS — E2839 Other primary ovarian failure: Secondary | ICD-10-CM | POA: Diagnosis present

## 2022-05-13 DIAGNOSIS — M85852 Other specified disorders of bone density and structure, left thigh: Secondary | ICD-10-CM | POA: Diagnosis not present

## 2022-05-16 DIAGNOSIS — M67442 Ganglion, left hand: Secondary | ICD-10-CM | POA: Diagnosis not present

## 2022-05-16 DIAGNOSIS — M19042 Primary osteoarthritis, left hand: Secondary | ICD-10-CM | POA: Diagnosis not present

## 2022-05-16 DIAGNOSIS — M25842 Other specified joint disorders, left hand: Secondary | ICD-10-CM | POA: Diagnosis not present

## 2022-05-18 ENCOUNTER — Encounter: Payer: Self-pay | Admitting: Orthopedic Surgery

## 2022-05-18 ENCOUNTER — Other Ambulatory Visit: Payer: Self-pay | Admitting: Orthopedic Surgery

## 2022-05-24 ENCOUNTER — Encounter
Admission: RE | Disposition: A | Discharge status: Home / Self Care | Payer: Self-pay | Source: Home / Self Care | Attending: Orthopedic Surgery

## 2022-05-24 ENCOUNTER — Telehealth: Payer: Self-pay | Admitting: Primary Care

## 2022-05-24 ENCOUNTER — Other Ambulatory Visit: Payer: Self-pay

## 2022-05-24 ENCOUNTER — Ambulatory Visit: Payer: Medicare Other | Admitting: Anesthesiology

## 2022-05-24 ENCOUNTER — Other Ambulatory Visit
Admission: RE | Admit: 2022-05-24 | Discharge: 2022-05-24 | Disposition: A | Discharge status: Home / Self Care | Payer: Medicare Other | Attending: Anesthesiology | Admitting: Anesthesiology

## 2022-05-24 ENCOUNTER — Encounter: Payer: Self-pay | Admitting: Orthopedic Surgery

## 2022-05-24 ENCOUNTER — Ambulatory Visit
Admission: RE | Admit: 2022-05-24 | Discharge: 2022-05-24 | Disposition: A | Discharge status: Home / Self Care | Payer: Medicare Other | Attending: Orthopedic Surgery | Admitting: Orthopedic Surgery

## 2022-05-24 DIAGNOSIS — R569 Unspecified convulsions: Secondary | ICD-10-CM | POA: Diagnosis not present

## 2022-05-24 DIAGNOSIS — K219 Gastro-esophageal reflux disease without esophagitis: Secondary | ICD-10-CM | POA: Diagnosis not present

## 2022-05-24 DIAGNOSIS — E785 Hyperlipidemia, unspecified: Secondary | ICD-10-CM

## 2022-05-24 DIAGNOSIS — M19042 Primary osteoarthritis, left hand: Secondary | ICD-10-CM | POA: Insufficient documentation

## 2022-05-24 DIAGNOSIS — Z01818 Encounter for other preprocedural examination: Secondary | ICD-10-CM

## 2022-05-24 HISTORY — DX: Pain in unspecified joint: M25.50

## 2022-05-24 HISTORY — DX: Monoclonal gammopathy: D47.2

## 2022-05-24 HISTORY — DX: Restless legs syndrome: G25.81

## 2022-05-24 HISTORY — DX: Prediabetes: R73.03

## 2022-05-24 HISTORY — PX: DISTAL INTERPHALANGEAL JOINT FUSION: SHX6428

## 2022-05-24 HISTORY — DX: Hyperlipidemia, unspecified: E78.5

## 2022-05-24 LAB — POTASSIUM: Potassium: 3.9 mmol/L (ref 3.5–5.1)

## 2022-05-24 SURGERY — DISTAL INTERPHALANGEAL JOINT FUSION
Anesthesia: General | Site: Hand | Laterality: Left

## 2022-05-24 MED ORDER — DEXAMETHASONE SODIUM PHOSPHATE 4 MG/ML IJ SOLN
INTRAMUSCULAR | Status: DC | PRN
  Administered 2022-05-24: 4 mg via INTRAVENOUS

## 2022-05-24 MED ORDER — CEFAZOLIN SODIUM-DEXTROSE 2-4 GM/100ML-% IV SOLN
2.0000 g | INTRAVENOUS | Status: AC
  Administered 2022-05-24: 2 g via INTRAVENOUS

## 2022-05-24 MED ORDER — BUPIVACAINE HCL 0.5 % IJ SOLN
INTRAMUSCULAR | Status: DC | PRN
  Administered 2022-05-24: 20 mL

## 2022-05-24 MED ORDER — SODIUM CHLORIDE 0.9 % IV SOLN: INTRAVENOUS | Status: DC

## 2022-05-24 MED ORDER — ONDANSETRON HCL 4 MG/2ML IJ SOLN: 4.0000 mg | Freq: Once | INTRAMUSCULAR | Status: DC | PRN

## 2022-05-24 MED ORDER — LACTATED RINGERS IV SOLN: INTRAVENOUS | Status: DC

## 2022-05-24 MED ORDER — HYDROCODONE-ACETAMINOPHEN 5-325 MG PO TABS: 1.0000 | ORAL_TABLET | ORAL | 0 refills | Status: DC | PRN

## 2022-05-24 MED ORDER — AMISULPRIDE (ANTIEMETIC) 5 MG/2ML IV SOLN
INTRAVENOUS | Status: DC | PRN
  Administered 2022-05-24: 10 mg via INTRAVENOUS

## 2022-05-24 MED ORDER — MIDAZOLAM HCL 5 MG/5ML IJ SOLN
INTRAMUSCULAR | Status: DC | PRN
  Administered 2022-05-24: 2 mg via INTRAVENOUS

## 2022-05-24 MED ORDER — ONDANSETRON HCL 4 MG/2ML IJ SOLN: 4.0000 mg | Freq: Four times a day (QID) | INTRAMUSCULAR | Status: DC | PRN

## 2022-05-24 MED ORDER — ACETAMINOPHEN 325 MG PO TABS: 325.0000 mg | ORAL_TABLET | Freq: Four times a day (QID) | ORAL | Status: DC | PRN

## 2022-05-24 MED ORDER — HYDROCODONE-ACETAMINOPHEN 5-325 MG PO TABS: 1.0000 | ORAL_TABLET | ORAL | Status: DC | PRN

## 2022-05-24 MED ORDER — SCOPOLAMINE 1 MG/3DAYS TD PT72: 1.0000 | MEDICATED_PATCH | TRANSDERMAL | Status: DC

## 2022-05-24 MED ORDER — LACTATED RINGERS IV SOLN: INTRAVENOUS | Status: DC | PRN

## 2022-05-24 MED ORDER — EPHEDRINE SULFATE (PRESSORS) 50 MG/ML IJ SOLN
INTRAMUSCULAR | Status: DC | PRN
  Administered 2022-05-24 (×2): 5 mg via INTRAVENOUS

## 2022-05-24 MED ORDER — HYDROCODONE-ACETAMINOPHEN 7.5-325 MG PO TABS: 1.0000 | ORAL_TABLET | ORAL | Status: DC | PRN

## 2022-05-24 MED ORDER — ONDANSETRON HCL 4 MG PO TABS: 4.0000 mg | ORAL_TABLET | Freq: Four times a day (QID) | ORAL | Status: DC | PRN

## 2022-05-24 MED ORDER — FENTANYL CITRATE (PF) 100 MCG/2ML IJ SOLN
INTRAMUSCULAR | Status: DC | PRN
  Administered 2022-05-24 (×5): 25 ug via INTRAVENOUS

## 2022-05-24 MED ORDER — FENTANYL CITRATE PF 50 MCG/ML IJ SOSY
25.0000 ug | PREFILLED_SYRINGE | INTRAMUSCULAR | Status: DC | PRN
  Administered 2022-05-24: 25 ug via INTRAVENOUS

## 2022-05-24 MED ORDER — 0.9 % SODIUM CHLORIDE (POUR BTL) OPTIME
TOPICAL | Status: DC | PRN
  Administered 2022-05-24: 500 mL

## 2022-05-24 MED ORDER — PROPOFOL 10 MG/ML IV BOLUS
INTRAVENOUS | Status: DC | PRN
  Administered 2022-05-24: 20 mg via INTRAVENOUS
  Administered 2022-05-24: 150 mg via INTRAVENOUS

## 2022-05-24 MED ORDER — KETOROLAC TROMETHAMINE 15 MG/ML IJ SOLN
INTRAMUSCULAR | Status: DC | PRN
  Administered 2022-05-24: 15 mg via INTRAVENOUS

## 2022-05-24 MED ORDER — ONDANSETRON HCL 4 MG/2ML IJ SOLN
INTRAMUSCULAR | Status: DC | PRN
  Administered 2022-05-24: 4 mg via INTRAVENOUS

## 2022-05-24 MED ORDER — MORPHINE SULFATE (PF) 2 MG/ML IV SOLN: 0.5000 mg | INTRAVENOUS | Status: DC | PRN

## 2022-05-24 SURGICAL SUPPLY — 30 items
APL PRP STRL LF DISP 70% ISPRP (MISCELLANEOUS) ×1
BANDAGE GAUZE 1X75IN STRL (MISCELLANEOUS) ×1 IMPLANT
BIT DRILL 2 MICRO LNG NS (BIT) IMPLANT
BIT DRILL MICRO LNG NONSTRL (BIT) ×1
BNDG CMPR 75X11 PLY HI ABS (MISCELLANEOUS) ×1
BNDG GAUZE 1X75IN STRL (MISCELLANEOUS) ×1
CHLORAPREP W/TINT 26 (MISCELLANEOUS) ×1 IMPLANT
CUFF TOURN SGL QUICK 18X4 (TOURNIQUET CUFF) IMPLANT
DRAPE FLUOR MINI C-ARM 54X84 (DRAPES) ×1 IMPLANT
ELECT REM PT RETURN 9FT ADLT (ELECTROSURGICAL) ×1
ELECTRODE REM PT RTRN 9FT ADLT (ELECTROSURGICAL) ×1 IMPLANT
GAUZE SPONGE 4X4 12PLY STRL (GAUZE/BANDAGES/DRESSINGS) ×1 IMPLANT
GLOVE BIOGEL PI IND STRL 9 (GLOVE) ×1 IMPLANT
GLOVE SURG SYN 9.0  PF PI (GLOVE) ×1
GLOVE SURG SYN 9.0 PF PI (GLOVE) ×1 IMPLANT
GOWN STRL REUS W/ TWL LRG LVL3 (GOWN DISPOSABLE) ×1 IMPLANT
GOWN STRL REUS W/TWL LRG LVL3 (GOWN DISPOSABLE) ×1
K-WIRE DBL ENDED .35 (WIRE) ×2
KIT TURNOVER KIT A (KITS) ×1 IMPLANT
KWIRE DBL ENDED .35 (WIRE) IMPLANT
NDL FILTER BLUNT 18X1 1/2 (NEEDLE) ×1 IMPLANT
NEEDLE FILTER BLUNT 18X1 1/2 (NEEDLE) ×1 IMPLANT
NS IRRIG 500ML POUR BTL (IV SOLUTION) ×1 IMPLANT
PACK EXTREMITY ARMC (MISCELLANEOUS) ×1 IMPLANT
PADDING CAST BLEND 4X4 NS (MISCELLANEOUS) ×1 IMPLANT
SCALPEL PROTECTED #15 DISP (BLADE) ×2 IMPLANT
SCREW CANN 2.5-2.8MM MICRO 26 (Screw) IMPLANT
SCREW COMP ST 2.5-2.8X24MM (Screw) IMPLANT
SUT ETHILON 4 0 FS (SUTURE) IMPLANT
SUT ETHILON 4 0 PS 2 18 (SUTURE) IMPLANT

## 2022-05-24 NOTE — Op Note (Signed)
05/24/2022  1:57 PM  PATIENT:  April Nguyen  71 y.o. female  PRE-OPERATIVE DIAGNOSIS:  Primary osteoarthrithis of left hand  M19.042  POST-OPERATIVE DIAGNOSIS:  Primary osteoarthrithis of left hand   PROCEDURE:  Procedure(s): Left index and ring DIP fusion (Left)  SURGEON: Laurene Footman, MD  ASSISTANTS: None  ANESTHESIA:   general  EBL:  Total I/O In: 500 [I.V.:500] Out: -   BLOOD ADMINISTERED:none  DRAINS: none   LOCAL MEDICATIONS USED:  MARCAINE     SPECIMEN:  No Specimen  DISPOSITION OF SPECIMEN:  N/A  COUNTS:  YES  TOURNIQUET:   Total Tourniquet Time Documented: Upper Arm (Left) - 53 minutes Total: Upper Arm (Left) - 53 minutes   IMPLANTS: Acumed fusion screw 24 and 26 mm   DICTATION: .Dragon Dictation patient was brought to the operating room and after adequate general anesthesia was obtained the left arm was prepped and draped in the usual sterile manner.  A tourniquet was applied the upper arm first and then after appropriate patient identification and timeout procedure was completed tourniquet was raised.  The dorsum of the index DIP was exposed with a T-type incision with the skin flaps held back with a suture the extensor tendon was incised and the joint debrided with the use of rongeurs and the K wire from the screw set.  When the skin was adequately debrided the pin was sent out through the tip of the distal phalanx and then retrograded back into the middle phalanx.  A small incision was made at the tip of the finger measurement made drilling and then on the index finger a 24 mm Acumed screw inserted with good compression at the fusion site.  The head of the screw was buried so it would not be palpable.  The K wire was removed and the wound irrigated.  Identical procedure was carried out on the ring finger with a 26 mm screw utilized.  The wounds were then closed with 4-0 nylon in a simple interrupted fashion.  Corner suture made at the apex of the T.  At the  start of the case 10 cc of half percent Sensorcaine was infiltrated into the base of each finger for digital block to aid in postop analgesia.  The wounds were then dressed with Xeroform 4 x 4's conformer and Ace wrap.  Tourniquet let down at the close of the case.  PLAN OF CARE: Discharge to home after PACU  PATIENT DISPOSITION:  PACU - hemodynamically stable.

## 2022-05-24 NOTE — Telephone Encounter (Signed)
Noted. Order for a lipid panel added back in for next year.

## 2022-05-24 NOTE — Transfer of Care (Signed)
Immediate Anesthesia Transfer of Care Note  Patient: April Nguyen  Procedure(s) Performed: Left index and ring DIP fusion (Left: Hand)  Patient Location: PACU  Anesthesia Type: General LMA  Level of Consciousness: awake, alert  and patient cooperative  Airway and Oxygen Therapy: Patient Spontanous Breathing and Patient connected to supplemental oxygen  Post-op Assessment: Post-op Vital signs reviewed, Patient's Cardiovascular Status Stable, Respiratory Function Stable, Patent Airway and No signs of Nausea or vomiting  Post-op Vital Signs: Reviewed and stable  Complications: No notable events documented.

## 2022-05-24 NOTE — Anesthesia Preprocedure Evaluation (Signed)
Anesthesia Evaluation  Patient identified by MRN, date of birth, ID band Patient awake    Reviewed: Allergy & Precautions, H&P , NPO status , Patient's Chart, lab work & pertinent test results, reviewed documented beta blocker date and time   History of Anesthesia Complications (+) PONV, Family history of anesthesia reaction and history of anesthetic complications  Airway Mallampati: II  TM Distance: >3 FB Neck ROM: full    Dental  (+) Teeth Intact   Pulmonary neg pulmonary ROS,    Pulmonary exam normal        Cardiovascular Exercise Tolerance: Good negative cardio ROS Normal cardiovascular exam Rate:Normal     Neuro/Psych  Headaches, Seizures -,  PSYCHIATRIC DISORDERS Anxiety Depression    GI/Hepatic Neg liver ROS, GERD  Medicated,  Endo/Other  negative endocrine ROS  Renal/GU negative Renal ROS  negative genitourinary   Musculoskeletal   Abdominal   Peds  Hematology negative hematology ROS (+)   Anesthesia Other Findings   Reproductive/Obstetrics negative OB ROS                             Anesthesia Physical Anesthesia Plan  ASA: 3  Anesthesia Plan: General LMA   Post-op Pain Management:    Induction:   PONV Risk Score and Plan:   Airway Management Planned:   Additional Equipment:   Intra-op Plan:   Post-operative Plan:   Informed Consent: I have reviewed the patients History and Physical, chart, labs and discussed the procedure including the risks, benefits and alternatives for the proposed anesthesia with the patient or authorized representative who has indicated his/her understanding and acceptance.       Plan Discussed with: CRNA  Anesthesia Plan Comments:         Anesthesia Quick Evaluation

## 2022-05-24 NOTE — Telephone Encounter (Signed)
Nurse from [facility]  called and stated that she pulled the wrong lipid panel order on patient. The order for the lipid panel for next year needs to be put back in.

## 2022-05-24 NOTE — Anesthesia Procedure Notes (Signed)
Procedure Name: LMA Insertion Date/Time: 05/24/2022 12:47 PM  Performed by: Tobie Poet, CRNAPre-anesthesia Checklist: Patient identified, Emergency Drugs available, Suction available and Patient being monitored Patient Re-evaluated:Patient Re-evaluated prior to induction Oxygen Delivery Method: Circle system utilized Preoxygenation: Pre-oxygenation with 100% oxygen Induction Type: IV induction Ventilation: Mask ventilation without difficulty LMA: LMA inserted LMA Size: 4.0 Tube type: Oral Number of attempts: 1 Airway Equipment and Method: Stylet and Oral airway Placement Confirmation: ETT inserted through vocal cords under direct vision, positive ETCO2 and breath sounds checked- equal and bilateral Tube secured with: Tape Dental Injury: Teeth and Oropharynx as per pre-operative assessment

## 2022-05-24 NOTE — H&P (Signed)
Chief Complaint  Patient presents with  Left Hand - Pain  Lt Index and Ring DIP    History of the Present Illness: April Nguyen is a 71 y.o. female here today.  The patient presents for evaluation of left hand pain. This is a visit today with x-ray.  The patient states she has pain in her left ring and index and right long fingers. She states her left ring finger does bother her. She states they told her the hardware was loose when she saw rheumatology.  The patient has been healthy otherwise.  The patient retired 2.5 years ago.  I have reviewed past medical, surgical, social and family history, and allergies as documented in the EMR.  Past Medical History: Past Medical History:  Diagnosis Date  Rectocele   Past Surgical History: Past Surgical History:  Procedure Laterality Date  EXCISION PAROTID GLAND/TUMOR 1986  HYSTERECTOMY 2001  COLONOSCOPY 04/08/2004  Normal Colon: CBF 04/2014  CHOLECYSTECTOMY  DILATION AND CURETTAGE OF UTERUS   Past Family History: Family History  Problem Relation Age of Onset  Arthritis Mother  Stroke Mother  Heart disease Mother  Diabetes Mother  Heart disease Father   Medications: Current Outpatient Medications Ordered in Epic  Medication Sig Dispense Refill  celecoxib (CELEBREX) 100 MG capsule Take up to two pills twice daily as needed for pain. 120 capsule 2  cetirizine (ZYRTEC) 10 MG tablet Take 10 mg by mouth once daily  estradioL (ESTRACE) 0.01 % (0.1 mg/gram) vaginal cream Place 2 g vaginally once daily  eszopiclone (LUNESTA) 1 MG tablet Take 1 mg by mouth at bedtime as needed for Sleep Take immediately before bedtime.  fluticasone propionate (FLONASE) 50 mcg/actuation nasal spray Place 2 sprays into both nostrils once daily  gabapentin (NEURONTIN) 300 MG capsule Take 300 mg by mouth at bedtime  hydroCHLOROthiazide (HYDRODIURIL) 25 MG tablet Take 25 mg by mouth once daily  omeprazole (PRILOSEC) 40 MG DR capsule Take 40 mg by  mouth once daily.  PARoxetine (PAXIL) 20 MG tablet Take 20 mg by mouth once daily  rosuvastatin (CRESTOR) 10 MG tablet   No current Epic-ordered facility-administered medications on file.   Allergies: Allergies  Allergen Reactions  Septra [Sulfamethoxazole-Trimethoprim] Rash    Body mass index is 26.16 kg/m.  Review of Systems: A comprehensive 14 point ROS was performed, reviewed, and the pertinent orthopaedic findings are documented in the HPI.  Vitals:  05/16/22 0834  BP: 124/78    General Physical Examination:   General/Constitutional: No apparent distress: well-nourished and well developed. Eyes: Pupils equal, round with synchronous movement. Lungs: Clear to auscultation HEENT: Normal Vascular: No edema, swelling or tenderness, except as noted in detailed exam. Cardiac: Heart rate and rhythm is regular. Integumentary: No impressive skin lesions present, except as noted in detailed exam. Neuro/Psych: Normal mood and affect, oriented to person, place and time.  Musculoskeletal Examination: On exam, mucus cyst on the left index and ring fingers. Right long finger fusion is solid.  Radiographs:  AP, lateral, and oblique x-rays of the left hand were ordered and personally reviewed today. These show severe degenerative changes to the DIP joints, DIP joint of the index finger, prior DIP fusion of the long finger, large spurs on the dorsum of the ring and index fingers.  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 No evidence of acute trauma or instability pattern.  Assessment: ICD-10-CM  1. Cyst of joint of left hand M25.842  2. Mucous cyst of digit of left hand M67.442  3. 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  We discussed the patient's x-ray findings. I recommend left hand digit trigger finger release at the surgery center in [place].  Surgical Risks:  The nature of the condition and the proposed procedure has been reviewed in detail with the patient. Surgical versus non-surgical options and prognosis for recovery have been reviewed and the inherent risks and benefits of each have been discussed including the risks of infection, bleeding, injury to nerves/blood vessels/tendons, incomplete relief of symptoms, persisting pain and/or stiffness, loss of function, complex regional pain syndrome, failure of the procedure, as appropriate.  Document Attestation: I, Sowjanya Panditi, have reviewed and updated documentation for [facility], MD, utilizing [employer] D[employer]. Electronically signed by Lauris Poag, MD at 05/16/2022 12:00 PM EDT  Reviewed  H+P. No changes noted.

## 2022-05-24 NOTE — Discharge Instructions (Addendum)
Keep hand elevated the next few days is much as possible Pain medicine as directed Keep dressing clean and dry Call office if you are having any problems [phone]

## 2022-05-24 NOTE — Anesthesia Postprocedure Evaluation (Signed)
Anesthesia Post Note  Patient: ABRIA VANNOSTRAND  Procedure(s) Performed: Left index and ring DIP fusion (Left: Hand)  Patient location during evaluation: PACU Anesthesia Type: General Level of consciousness: awake and alert Pain management: pain level controlled Vital Signs Assessment: post-procedure vital signs reviewed and stable Respiratory status: spontaneous breathing, nonlabored ventilation, respiratory function stable and patient connected to nasal cannula oxygen Cardiovascular status: blood pressure returned to baseline and stable Postop Assessment: no apparent nausea or vomiting Anesthetic complications: no   No notable events documented.   Last Vitals:  Vitals:   05/24/22 1420 05/24/22 1425  BP:    Pulse: (!) 103 (!) 102  Resp: 17 15  Temp:    SpO2: 98% 97%    Last Pain:  Vitals:   05/24/22 1425  TempSrc:   PainSc: [place] Malkie Wille

## 2022-05-27 ENCOUNTER — Encounter: Payer: Self-pay | Admitting: Orthopedic Surgery

## 2022-05-27 DIAGNOSIS — M19042 Primary osteoarthritis, left hand: Secondary | ICD-10-CM | POA: Diagnosis not present

## 2022-06-01 ENCOUNTER — Other Ambulatory Visit: Payer: Medicare Other

## 2022-06-02 ENCOUNTER — Other Ambulatory Visit: Payer: Self-pay | Admitting: Primary Care

## 2022-06-02 DIAGNOSIS — E785 Hyperlipidemia, unspecified: Secondary | ICD-10-CM

## 2022-06-02 DIAGNOSIS — F418 Other specified anxiety disorders: Secondary | ICD-10-CM

## 2022-06-02 DIAGNOSIS — N951 Menopausal and female climacteric states: Secondary | ICD-10-CM

## 2022-07-05 DIAGNOSIS — M159 Polyosteoarthritis, unspecified: Secondary | ICD-10-CM | POA: Diagnosis not present

## 2022-07-07 DIAGNOSIS — M19042 Primary osteoarthritis, left hand: Secondary | ICD-10-CM | POA: Diagnosis not present

## 2022-07-07 DIAGNOSIS — Q7002 Fused fingers, left hand: Secondary | ICD-10-CM | POA: Diagnosis not present

## 2022-07-11 DIAGNOSIS — M778 Other enthesopathies, not elsewhere classified: Secondary | ICD-10-CM | POA: Diagnosis not present

## 2022-07-11 DIAGNOSIS — M65872 Other synovitis and tenosynovitis, left ankle and foot: Secondary | ICD-10-CM | POA: Diagnosis not present

## 2022-07-11 DIAGNOSIS — M79672 Pain in left foot: Secondary | ICD-10-CM | POA: Diagnosis not present

## 2022-07-12 ENCOUNTER — Other Ambulatory Visit: Payer: Self-pay | Admitting: Primary Care

## 2022-07-12 DIAGNOSIS — M1990 Unspecified osteoarthritis, unspecified site: Secondary | ICD-10-CM

## 2022-07-12 DIAGNOSIS — G2581 Restless legs syndrome: Secondary | ICD-10-CM

## 2022-07-15 ENCOUNTER — Inpatient Hospital Stay: Payer: Medicare Other | Attending: Internal Medicine

## 2022-07-15 DIAGNOSIS — D472 Monoclonal gammopathy: Secondary | ICD-10-CM | POA: Insufficient documentation

## 2022-07-15 DIAGNOSIS — Z808 Family history of malignant neoplasm of other organs or systems: Secondary | ICD-10-CM | POA: Insufficient documentation

## 2022-07-15 DIAGNOSIS — M2550 Pain in unspecified joint: Secondary | ICD-10-CM | POA: Insufficient documentation

## 2022-07-15 DIAGNOSIS — H8109 Meniere's disease, unspecified ear: Secondary | ICD-10-CM | POA: Insufficient documentation

## 2022-07-15 DIAGNOSIS — R5382 Chronic fatigue, unspecified: Secondary | ICD-10-CM | POA: Diagnosis not present

## 2022-07-15 LAB — COMPREHENSIVE METABOLIC PANEL
ALT: 15 U/L (ref 0–44)
AST: 18 U/L (ref 15–41)
Albumin: 3.9 g/dL (ref 3.5–5.0)
Alkaline Phosphatase: 55 U/L (ref 38–126)
Anion gap: 8 (ref 5–15)
BUN: 16 mg/dL (ref 8–23)
CO2: 25 mmol/L (ref 22–32)
Calcium: 8.7 mg/dL — ABNORMAL LOW (ref 8.9–10.3)
Chloride: 101 mmol/L (ref 98–111)
Creatinine, Ser: 0.75 mg/dL (ref 0.44–1.00)
GFR, Estimated: >60 mL/min (ref >60)
Glucose, Bld: 94 mg/dL (ref 70–99)
Potassium: 4 mmol/L (ref 3.5–5.1)
Sodium: 134 mmol/L — ABNORMAL LOW (ref 135–145)
Total Bilirubin: 0.5 mg/dL (ref 0.3–1.2)
Total Protein: 7.3 g/dL (ref 6.5–8.1)

## 2022-07-15 LAB — CBC WITH DIFFERENTIAL/PLATELET
Abs Immature Granulocytes: 0.02 10*3/uL (ref 0.00–0.07)
Basophils Absolute: 0.1 10*3/uL (ref 0.0–0.1)
Basophils Relative: 1 %
Eosinophils Absolute: 0.2 10*3/uL (ref 0.0–0.5)
Eosinophils Relative: 3 %
HCT: 39.4 % (ref 36.0–46.0)
Hemoglobin: 12.9 g/dL (ref 12.0–15.0)
Immature Granulocytes: 0 %
Lymphocytes Relative: 22 %
Lymphs Abs: 1.7 10*3/uL (ref 0.7–4.0)
MCH: 31.3 pg (ref 26.0–34.0)
MCHC: 32.7 g/dL (ref 30.0–36.0)
MCV: 95.6 fL (ref 80.0–100.0)
Monocytes Absolute: 0.6 10*3/uL (ref 0.1–1.0)
Monocytes Relative: 8 %
Neutro Abs: 4.9 10*3/uL (ref 1.7–7.7)
Neutrophils Relative %: 66 %
Platelets: 357 10*3/uL (ref 150–400)
RBC: 4.12 MIL/uL (ref 3.87–5.11)
RDW: 12.3 % (ref 11.5–15.5)
WBC: 7.4 10*3/uL (ref 4.0–10.5)
nRBC: 0 % (ref 0.0–0.2)

## 2022-07-15 LAB — LACTATE DEHYDROGENASE: LDH: 120 U/L (ref 98–192)

## 2022-07-18 LAB — KAPPA/LAMBDA LIGHT CHAINS
Kappa free light chain: 20.6 mg/L — ABNORMAL HIGH (ref 3.3–19.4)
Kappa, lambda light chain ratio: 1.23 (ref 0.26–1.65)
Lambda free light chains: 16.7 mg/L (ref 5.7–26.3)

## 2022-07-19 LAB — MULTIPLE MYELOMA PANEL, SERUM
Albumin SerPl Elph-Mcnc: 3.8 g/dL (ref 2.9–4.4)
Albumin/Glob SerPl: 1.5 (ref 0.7–1.7)
Alpha 1: 0.2 g/dL (ref 0.0–0.4)
Alpha2 Glob SerPl Elph-Mcnc: 0.6 g/dL (ref 0.4–1.0)
B-Globulin SerPl Elph-Mcnc: 0.9 g/dL (ref 0.7–1.3)
Gamma Glob SerPl Elph-Mcnc: 1 g/dL (ref 0.4–1.8)
Globulin, Total: 2.7 g/dL (ref 2.2–3.9)
IgA: 279 mg/dL (ref 87–352)
IgG (Immunoglobin G), Serum: 923 mg/dL (ref 586–1602)
IgM (Immunoglobulin M), Srm: 334 mg/dL — ABNORMAL HIGH (ref 26–217)
M Protein SerPl Elph-Mcnc: 0.4 g/dL — ABNORMAL HIGH
Total Protein ELP: 6.5 g/dL (ref 6.0–8.5)

## 2022-07-25 ENCOUNTER — Other Ambulatory Visit: Payer: Self-pay

## 2022-07-25 ENCOUNTER — Inpatient Hospital Stay: Payer: Medicare Other | Admitting: Medical Oncology

## 2022-07-25 ENCOUNTER — Encounter: Payer: Self-pay | Admitting: Medical Oncology

## 2022-07-25 VITALS — BP 131/76 | HR 86 | Temp 97.6°F | Resp 20 | Ht 65.0 in | Wt 157.7 lb

## 2022-07-25 DIAGNOSIS — Z808 Family history of malignant neoplasm of other organs or systems: Secondary | ICD-10-CM | POA: Diagnosis not present

## 2022-07-25 DIAGNOSIS — Z789 Other specified health status: Secondary | ICD-10-CM | POA: Diagnosis not present

## 2022-07-25 DIAGNOSIS — E871 Hypo-osmolality and hyponatremia: Secondary | ICD-10-CM

## 2022-07-25 DIAGNOSIS — D472 Monoclonal gammopathy: Secondary | ICD-10-CM | POA: Diagnosis not present

## 2022-07-25 DIAGNOSIS — M2550 Pain in unspecified joint: Secondary | ICD-10-CM | POA: Diagnosis not present

## 2022-07-25 DIAGNOSIS — H8109 Meniere's disease, unspecified ear: Secondary | ICD-10-CM | POA: Diagnosis not present

## 2022-07-25 DIAGNOSIS — R5382 Chronic fatigue, unspecified: Secondary | ICD-10-CM | POA: Diagnosis not present

## 2022-07-25 NOTE — Progress Notes (Signed)
[facility] NOTE  Patient Care Team: Pleas Koch, NP as PCP - General (Internal Medicine) Cammie Sickle, MD as Consulting Physician (Oncology)  CHIEF COMPLAINTS/PURPOSE OF CONSULTATION: Monoclonal gammopathy  HEMATOLOGY HISTORY  MARCH 2023- IgM 03 gm/dl ]Rheumatology; [facility][Immunofixation shows IgM monoclonal protein with lambda light chain  specificity; MAY 2023- IgM Lamda- 0.4 gm/dl; K/L=[place]. Hb13; creatinine- 0.58  #  rheumatology- joint pains  HISTORY OF PRESENTING ILLNESS: Alone. Independent ambulating.  April Nguyen 71 y.o.  female is here to review the results of her work-up ordered for monoclonal gammopathy.  She reports that she si doing well. She is happy today as her granddaughter was recently accepted into [employer]. She will be attending [employer] classes every summer for the next three years. She plans to become a Geologist, engineering for people with special needs.   She is feeling well overall despite her chronic fatigue. No new bony pains, night sweats.   Review of Systems  Constitutional:  Positive for malaise/fatigue. Negative for chills, diaphoresis, fever and weight loss.  HENT:  Negative for nosebleeds and sore throat.   Eyes:  Negative for double vision.  Respiratory:  Negative for cough, hemoptysis, sputum production, shortness of breath and wheezing.   Cardiovascular:  Negative for chest pain, palpitations, orthopnea and leg swelling.  Gastrointestinal:  Negative for abdominal pain, blood in stool, constipation, diarrhea, heartburn, melena, nausea and vomiting.  Genitourinary:  Negative for dysuria, frequency and urgency.  Musculoskeletal:  Positive for back pain and joint pain.  Skin: Negative.  Negative for itching and rash.  Neurological:  Negative for dizziness, tingling, focal weakness, weakness and headaches.  Endo/Heme/Allergies:  Does not bruise/bleed easily.  Psychiatric/Behavioral:  Negative for depression. The  patient is not nervous/anxious and does not have insomnia.     MEDICAL HISTORY:  Past Medical History:  Diagnosis Date   Allergy    Seasonal   Anxiety    Arthritis    neck, wrists,hands, toes, and feet   COVID-19 09/08/2021   Depression    Eustachian tube dysfunction    Family history of adverse reaction to anesthesia    brother - PONV and combative   Genital warts    In past   GERD (gastroesophageal reflux disease)    History of shingles 04/16/2015   March 2016    history of Tumor of parotid gland    Hyperlipidemia    Meniere's disease    slight hearing loss, car sickness and dizziness   Migraines    sinus/stress   Monoclonal gammopathy    Polyarthralgia    PONV (postoperative nausea and vomiting)    and headaches and combative   Pre-diabetes    RLS (restless legs syndrome)    Seizures ([place])    Dx 16 yrs ago - hormonal - none at least 3 yrs   Seizures ([place])    H/O due to hormone issues    SURGICAL HISTORY: Past Surgical History:  Procedure Laterality Date   ABDOMINAL HYSTERECTOMY     CHOLECYSTECTOMY  2008   dialation and cartarage  1982, 1999, 2000   DISTAL INTERPHALANGEAL JOINT FUSION Left 05/24/2022   Procedure: Left index and ring DIP fusion;  Surgeon: Hessie Knows, MD;  Location: [facility];  Service: Orthopedics;  Laterality: Left;   FINGER SURGERY  2011, 2012   Bone fusion of middle finger right and left hands   FINGER SURGERY  2013   Pin removed from left middle finger  GANGLION CYST EXCISION  2011, 2012   Left and right hands   MYRINGOTOMY WITH TUBE PLACEMENT Bilateral 05/13/2015   Procedure: MYRINGOTOMY WITH  BUTTERFLY TUBE PLACEMENT;  Surgeon: Carloyn Manner, MD;  Location: [facility];  Service: ENT;  Laterality: Bilateral;   MYRINGOTOMY WITH TUBE PLACEMENT Bilateral 08/28/2019   Procedure: MYRINGOTOMY WITH BUTTERFLY TUBE PLACEMENT;  Surgeon: Carloyn Manner, MD;  Location: [facility];  Service: ENT;  Laterality:  Bilateral;   PAROTID GLAND TUMOR [place]   TUBAL LIGATION  2010    SOCIAL HISTORY: Social History   Socioeconomic History   Marital status: Married    Spouse name: Not on file   Number of children: Not on file   Years of education: Not on file   Highest education level: Not on file  Occupational History   Not on file  Tobacco Use   Smoking status: Never   Smokeless tobacco: Never  Vaping Use   Vaping Use: Never used  Substance and Sexual Activity   Alcohol use: No    Alcohol/week: 0.0 standard drinks of alcohol   Drug use: No   Sexual activity: Yes    Partners: Male    Comment: Husband  Other Topics Concern   Not on file  Social History Narrative   Work at the [employer] at [employer].  Lives with husband with [place]. Children- daughter and son;no smoking or alcohol.      -------------------------------------------------------------------------------    Grandchildren- 6    Caffeine- 1 cup in the morning, soda occasionally, green tea hot/cold   Enjoys reading, spending time at beach, spending time with family.   Social Determinants of Health   Financial Resource Strain: Low Risk  (02/14/2022)   Overall Financial Resource Strain (CARDIA)    Difficulty of Paying Living Expenses: Not hard at all  Food Insecurity: No Food Insecurity (02/14/2022)   Hunger Vital Sign    Worried About Running Out of Food in the Last Year: Never true    Ran Out of Food in the Last Year: Never true  Transportation Needs: No Transportation Needs (02/14/2022)   PRAPARE - Hydrologist (Medical): No    Lack of Transportation (Non-Medical): No  Physical Activity: Insufficiently Active (02/14/2022)   Exercise Vital Sign    Days of Exercise per Week: 3 days    Minutes of Exercise per Session: 20 min  Stress: No Stress Concern Present (02/14/2022)   [place]    Feeling of Stress : Not at all  Social Connections: Moderately Isolated (02/14/2022)   Social Connection and Isolation Panel [NHANES]    Frequency of Communication with Friends and Family: More than three times a week    Frequency of Social Gatherings with Friends and Family: More than three times a week    Attends Religious Services: Never    Marine scientist or Organizations: No    Attends Archivist Meetings: Never    Marital Status: Married  Human resources officer Violence: Not At Risk (02/14/2022)   Humiliation, Afraid, Rape, and Kick questionnaire    Fear of Current or Ex-Partner: No    Emotionally Abused: No    Physically Abused: No    Sexually Abused: No    FAMILY HISTORY: Family History  Problem Relation Age of Onset   Arthritis Mother    Hyperlipidemia Mother    Hypertension Mother  Stroke Mother    Heart disease Mother    Diabetes Mother    Hyperlipidemia Father    Heart disease Father    Arthritis Brother    Throat cancer Maternal Grandfather    Breast cancer Neg Hx     ALLERGIES:  is allergic to septra [sulfamethoxazole-trimethoprim].  MEDICATIONS:  Current Outpatient Medications  Medication Sig Dispense Refill   Ascorbic Acid (VITAMIN C) 1000 MG tablet Take 1,800 mg by mouth daily.     celecoxib (CELEBREX) 100 MG capsule Take 100 mg by mouth 2 (two) times daily. 100 mg AM, 200 mg at dinner     cetirizine (ZYRTEC) 10 MG tablet Take 1 tablet (10 mg total) by mouth daily. (Patient taking differently: Take 10 mg by mouth daily. am) 30 tablet 11   Ergocalciferol (VITAMIN D2) 10 MCG (400 UNIT) TABS Take 800 Units by mouth daily.     estradiol (ESTRACE) 0.1 MG/GM vaginal cream APPLY 2 TO 3 TIMES WEEKLY 42.5 g 2   fluticasone (FLONASE) 50 MCG/ACT nasal spray Place 2 sprays into both nostrils daily. 16 g 1   gabapentin (NEURONTIN) 300 MG capsule TAKE 2 CAPSULES BY MOUTH EVERY NIGHT AT BEDTIME FOR RESTLESS LEGS. 180 capsule 0    hydrochlorothiazide (HYDRODIURIL) 25 MG tablet Take 1 tablet (25 mg total) by mouth daily. for blood pressure. 90 tablet 3   hydrOXYzine (ATARAX/VISTARIL) 10 MG tablet Take 1-2 tablets by mouth twice daily as needed for long car rides. 30 tablet 0   meclizine (ANTIVERT) 25 MG tablet Take 1 tablet (25 mg total) by mouth 3 (three) times daily as needed for dizziness (take as needed not with other antihistimines). 30 tablet 0   omeprazole (PRILOSEC) 20 MG capsule Take 1 capsule (20 mg total) by mouth daily. For heartburn 90 capsule 3   PARoxetine (PAXIL) 20 MG tablet Take 1 tablet (20 mg total) by mouth daily. For anxiety 90 tablet 2   rosuvastatin (CRESTOR) 10 MG tablet TAKE 1 TABLET BY MOUTH ONCE A DAY FOR CHOLESTEROL 90 tablet 2   No current facility-administered medications for this visit.      PHYSICAL EXAMINATION:   Vitals:   07/25/22 1334  BP: 131/76  Pulse: 86  Resp: 20  Temp: 97.6 F (36.4 C)   Filed Weights   07/25/22 1334  Weight: 157 lb 11.2 oz (71.5 kg)    Physical Exam Vitals and nursing note reviewed.  HENT:     Head: Normocephalic and atraumatic.     Mouth/Throat:     Pharynx: Oropharynx is clear.  Eyes:     Extraocular Movements: Extraocular movements intact.     Pupils: Pupils are equal, round, and reactive to light.  Cardiovascular:     Rate and Rhythm: Normal rate and regular rhythm.  Pulmonary:     Comments: Decreased breath sounds bilaterally.  Abdominal:     Palpations: Abdomen is soft.  Musculoskeletal:        General: Normal range of motion.     Cervical back: Normal range of motion.  Skin:    General: Skin is warm.  Neurological:     General: No focal deficit present.     Mental Status: She is alert and oriented to person, place, and time.  Psychiatric:        Behavior: Behavior normal.        Judgment: Judgment normal.     LABORATORY DATA:  I have reviewed the data as listed Lab Results  Component Value Date  WBC 7.4 07/15/2022    HGB 12.9 07/15/2022   HCT 39.4 07/15/2022   MCV 95.6 07/15/2022   PLT 357 07/15/2022   Recent Labs    12/29/21 1433 05/24/22 1030 07/15/22 1124  NA 136  --  134*  K 3.9 3.9 4.0  CL 102  --  101  CO2 27  --  25  GLUCOSE 95  --  94  BUN 14  --  16  CREATININE 0.58  --  0.75  CALCIUM 9.1  --  8.7*  GFRNONAA >60  --  >60  PROT 7.7  --  7.3  ALBUMIN 4.3  --  3.9  AST 20  --  18  ALT 19  --  15  ALKPHOS 57  --  55  BILITOT 0.3  --  0.5     No results found.  Lab Results  Component Value Date   KPAFRELGTCHN 20.6 (H) 07/15/2022   KPAFRELGTCHN 23.2 (H) 12/29/2021   LAMBDASER 16.7 07/15/2022   LAMBDASER 18.6 12/29/2021   KAPLAMBRATIO 1.23 07/15/2022   KAPLAMBRATIO 1.25 12/29/2021   Monoclonal gammopathy # [April-MAY 2023-]-IgM lambda 0.3 g/dL- 0.4 gm/dl   CBC CMP- WNL.    #MGUS. Labs look stable. Her low sodium level is likely secondary to her low salt diet and hydration with water. She will increase her calcium intake. No need for further work up at this time in regards to her MGUS- we will continue active surveillance every 6 months.    # Chronic fatigue: Given recent hypocalcemia and diet to help control her Meniere's disease  I think it would be reasonable to add on a B12, vitamin D and ferritin level for her next set of labs (pt declines today but is happy that this is being suggested for next time).    # DISPOSITION: # follow up in 6 months-  MD; 10 days PRIOR-  labs-cbc/cmp; LDH; MM panel; K/L light chains, B12, Vitamin D, ferritin, iron- [facility]   All questions were answered. The patient knows to call the clinic with any problems, questions or concerns. All questions were answered. The patient knows to call the clinic with any problems, questions or concerns.   Hughie Closs, PA-C 07/25/2022 2:17 PM

## 2022-08-12 DIAGNOSIS — M65872 Other synovitis and tenosynovitis, left ankle and foot: Secondary | ICD-10-CM | POA: Diagnosis not present

## 2022-08-12 DIAGNOSIS — M778 Other enthesopathies, not elsewhere classified: Secondary | ICD-10-CM | POA: Diagnosis not present

## 2022-10-12 ENCOUNTER — Other Ambulatory Visit: Payer: Self-pay | Admitting: Primary Care

## 2022-10-12 DIAGNOSIS — G2581 Restless legs syndrome: Secondary | ICD-10-CM

## 2022-10-12 DIAGNOSIS — M1990 Unspecified osteoarthritis, unspecified site: Secondary | ICD-10-CM

## 2022-11-16 LAB — HM DIABETES EYE EXAM

## 2022-11-24 ENCOUNTER — Other Ambulatory Visit: Payer: Self-pay | Admitting: Primary Care

## 2022-11-24 DIAGNOSIS — E785 Hyperlipidemia, unspecified: Secondary | ICD-10-CM

## 2022-11-24 DIAGNOSIS — F418 Other specified anxiety disorders: Secondary | ICD-10-CM

## 2022-12-28 ENCOUNTER — Telehealth: Payer: Self-pay | Admitting: Primary Care

## 2022-12-28 NOTE — Telephone Encounter (Signed)
Contacted April Nguyen to schedule their annual wellness visit. Appointment made for 02/08/2023.  [facility] Care Guide [facility] AWV TEAM Direct Dial: [phone]

## 2022-12-29 ENCOUNTER — Other Ambulatory Visit: Payer: Self-pay | Admitting: Primary Care

## 2022-12-29 DIAGNOSIS — N951 Menopausal and female climacteric states: Secondary | ICD-10-CM

## 2022-12-29 NOTE — Telephone Encounter (Signed)
Patient scheduled.

## 2022-12-29 NOTE — Telephone Encounter (Signed)
Patient is due for CPE/follow up in late July, this will be required prior to any further refills.  Please schedule, thank you!

## 2023-01-04 DIAGNOSIS — I890 Lymphedema, not elsewhere classified: Secondary | ICD-10-CM | POA: Diagnosis not present

## 2023-01-04 DIAGNOSIS — I872 Venous insufficiency (chronic) (peripheral): Secondary | ICD-10-CM | POA: Diagnosis not present

## 2023-01-04 DIAGNOSIS — R609 Edema, unspecified: Secondary | ICD-10-CM | POA: Diagnosis not present

## 2023-01-04 DIAGNOSIS — H2511 Age-related nuclear cataract, right eye: Secondary | ICD-10-CM | POA: Diagnosis not present

## 2023-01-04 DIAGNOSIS — I5032 Chronic diastolic (congestive) heart failure: Secondary | ICD-10-CM | POA: Diagnosis not present

## 2023-01-04 DIAGNOSIS — E139 Other specified diabetes mellitus without complications: Secondary | ICD-10-CM | POA: Diagnosis not present

## 2023-01-04 DIAGNOSIS — R5383 Other fatigue: Secondary | ICD-10-CM | POA: Diagnosis not present

## 2023-01-04 DIAGNOSIS — I110 Hypertensive heart disease with heart failure: Secondary | ICD-10-CM | POA: Diagnosis not present

## 2023-01-04 DIAGNOSIS — R42 Dizziness and giddiness: Secondary | ICD-10-CM | POA: Diagnosis not present

## 2023-01-04 DIAGNOSIS — Z79899 Other long term (current) drug therapy: Secondary | ICD-10-CM | POA: Diagnosis not present

## 2023-01-04 DIAGNOSIS — G4733 Obstructive sleep apnea (adult) (pediatric): Secondary | ICD-10-CM | POA: Diagnosis not present

## 2023-01-04 DIAGNOSIS — M179 Osteoarthritis of knee, unspecified: Secondary | ICD-10-CM | POA: Diagnosis not present

## 2023-01-04 DIAGNOSIS — H269 Unspecified cataract: Secondary | ICD-10-CM | POA: Diagnosis not present

## 2023-01-04 HISTORY — PX: CATARACT EXTRACTION: SUR2

## 2023-01-10 ENCOUNTER — Other Ambulatory Visit: Payer: Self-pay | Admitting: Primary Care

## 2023-01-10 DIAGNOSIS — M1990 Unspecified osteoarthritis, unspecified site: Secondary | ICD-10-CM

## 2023-01-10 DIAGNOSIS — G2581 Restless legs syndrome: Secondary | ICD-10-CM

## 2023-01-12 ENCOUNTER — Other Ambulatory Visit: Payer: Medicare Other

## 2023-01-18 DIAGNOSIS — H2512 Age-related nuclear cataract, left eye: Secondary | ICD-10-CM | POA: Diagnosis not present

## 2023-01-20 ENCOUNTER — Other Ambulatory Visit: Payer: Self-pay | Admitting: *Deleted

## 2023-01-20 DIAGNOSIS — D472 Monoclonal gammopathy: Secondary | ICD-10-CM

## 2023-01-23 ENCOUNTER — Ambulatory Visit: Payer: Medicare Other | Admitting: Internal Medicine

## 2023-01-23 ENCOUNTER — Inpatient Hospital Stay: Payer: Medicare Other | Attending: Internal Medicine

## 2023-01-23 ENCOUNTER — Other Ambulatory Visit: Payer: Medicare Other

## 2023-01-23 DIAGNOSIS — G2581 Restless legs syndrome: Secondary | ICD-10-CM | POA: Diagnosis not present

## 2023-01-23 DIAGNOSIS — Z79899 Other long term (current) drug therapy: Secondary | ICD-10-CM | POA: Diagnosis not present

## 2023-01-23 DIAGNOSIS — Z808 Family history of malignant neoplasm of other organs or systems: Secondary | ICD-10-CM | POA: Insufficient documentation

## 2023-01-23 DIAGNOSIS — M1990 Unspecified osteoarthritis, unspecified site: Secondary | ICD-10-CM | POA: Insufficient documentation

## 2023-01-23 DIAGNOSIS — D472 Monoclonal gammopathy: Secondary | ICD-10-CM | POA: Insufficient documentation

## 2023-01-23 DIAGNOSIS — Z8616 Personal history of COVID-19: Secondary | ICD-10-CM | POA: Diagnosis not present

## 2023-01-23 DIAGNOSIS — R5382 Chronic fatigue, unspecified: Secondary | ICD-10-CM | POA: Diagnosis not present

## 2023-01-23 DIAGNOSIS — M62838 Other muscle spasm: Secondary | ICD-10-CM | POA: Insufficient documentation

## 2023-01-23 LAB — CMP (CANCER CENTER ONLY)
ALT: 14 U/L (ref 0–44)
AST: 19 U/L (ref 15–41)
Albumin: 3.9 g/dL (ref 3.5–5.0)
Alkaline Phosphatase: 49 U/L (ref 38–126)
Anion gap: 9 (ref 5–15)
BUN: 10 mg/dL (ref 8–23)
CO2: 25 mmol/L (ref 22–32)
Calcium: 8.9 mg/dL (ref 8.9–10.3)
Chloride: 102 mmol/L (ref 98–111)
Creatinine: 0.57 mg/dL (ref 0.44–1.00)
GFR, Estimated: >60 mL/min (ref >60)
Glucose, Bld: 101 mg/dL — ABNORMAL HIGH (ref 70–99)
Potassium: 4.1 mmol/L (ref 3.5–5.1)
Sodium: 136 mmol/L (ref 135–145)
Total Bilirubin: 0.5 mg/dL (ref 0.3–1.2)
Total Protein: 6.9 g/dL (ref 6.5–8.1)

## 2023-01-23 LAB — CBC WITH DIFFERENTIAL (CANCER CENTER ONLY)
Abs Immature Granulocytes: 0.05 10*3/uL (ref 0.00–0.07)
Basophils Absolute: 0 10*3/uL (ref 0.0–0.1)
Basophils Relative: 1 %
Eosinophils Absolute: 0.1 10*3/uL (ref 0.0–0.5)
Eosinophils Relative: 2 %
HCT: 36.8 % (ref 36.0–46.0)
Hemoglobin: 12.4 g/dL (ref 12.0–15.0)
Immature Granulocytes: 1 %
Lymphocytes Relative: 19 %
Lymphs Abs: 1 10*3/uL (ref 0.7–4.0)
MCH: 32 pg (ref 26.0–34.0)
MCHC: 33.7 g/dL (ref 30.0–36.0)
MCV: 94.8 fL (ref 80.0–100.0)
Monocytes Absolute: 0.3 10*3/uL (ref 0.1–1.0)
Monocytes Relative: 7 %
Neutro Abs: 3.6 10*3/uL (ref 1.7–7.7)
Neutrophils Relative %: 70 %
Platelet Count: 333 10*3/uL (ref 150–400)
RBC: 3.88 MIL/uL (ref 3.87–5.11)
RDW: 11.9 % (ref 11.5–15.5)
WBC Count: 5.1 10*3/uL (ref 4.0–10.5)
nRBC: 0 % (ref 0.0–0.2)

## 2023-01-23 LAB — VITAMIN D 25 HYDROXY (VIT D DEFICIENCY, FRACTURES): Vit D, 25-Hydroxy: 48.21 ng/mL (ref 30–100)

## 2023-01-23 LAB — IRON AND TIBC
Iron: 82 ug/dL (ref 28–170)
Saturation Ratios: 24 % (ref 10.4–31.8)
TIBC: 344 ug/dL (ref 250–450)
UIBC: 262 ug/dL

## 2023-01-23 LAB — FERRITIN: Ferritin: 42 ng/mL (ref 11–307)

## 2023-01-23 LAB — LACTATE DEHYDROGENASE: LDH: 126 U/L (ref 98–192)

## 2023-01-23 LAB — VITAMIN B12: Vitamin B-12: 344 pg/mL (ref 180–914)

## 2023-01-24 LAB — KAPPA/LAMBDA LIGHT CHAINS
Kappa free light chain: 16.7 mg/L (ref 3.3–19.4)
Kappa, lambda light chain ratio: 0.85 (ref 0.26–1.65)
Lambda free light chains: 19.6 mg/L (ref 5.7–26.3)

## 2023-01-31 LAB — MULTIPLE MYELOMA PANEL, SERUM
Albumin SerPl Elph-Mcnc: 3.7 g/dL (ref 2.9–4.4)
Albumin/Glob SerPl: 1.5 (ref 0.7–1.7)
Alpha 1: 0.2 g/dL (ref 0.0–0.4)
Alpha2 Glob SerPl Elph-Mcnc: 0.6 g/dL (ref 0.4–1.0)
B-Globulin SerPl Elph-Mcnc: 0.8 g/dL (ref 0.7–1.3)
Gamma Glob SerPl Elph-Mcnc: 1 g/dL (ref 0.4–1.8)
Globulin, Total: 2.6 g/dL (ref 2.2–3.9)
IgA: 245 mg/dL (ref 87–352)
IgG (Immunoglobin G), Serum: 892 mg/dL (ref 586–1602)
IgM (Immunoglobulin M), Srm: 299 mg/dL — ABNORMAL HIGH (ref 26–217)
M Protein SerPl Elph-Mcnc: 0.4 g/dL — ABNORMAL HIGH
Total Protein ELP: 6.3 g/dL (ref 6.0–8.5)

## 2023-02-02 ENCOUNTER — Other Ambulatory Visit: Payer: Self-pay

## 2023-02-02 ENCOUNTER — Ambulatory Visit: Payer: Medicare Other | Admitting: Internal Medicine

## 2023-02-02 DIAGNOSIS — D472 Monoclonal gammopathy: Secondary | ICD-10-CM

## 2023-02-03 ENCOUNTER — Inpatient Hospital Stay: Payer: Medicare Other | Admitting: Internal Medicine

## 2023-02-03 ENCOUNTER — Encounter: Payer: Self-pay | Admitting: Internal Medicine

## 2023-02-03 VITALS — BP 125/70 | HR 92 | Temp 98.5°F | Ht 65.0 in | Wt 151.2 lb

## 2023-02-03 DIAGNOSIS — G2581 Restless legs syndrome: Secondary | ICD-10-CM | POA: Diagnosis not present

## 2023-02-03 DIAGNOSIS — Z8616 Personal history of COVID-19: Secondary | ICD-10-CM | POA: Diagnosis not present

## 2023-02-03 DIAGNOSIS — M1990 Unspecified osteoarthritis, unspecified site: Secondary | ICD-10-CM | POA: Diagnosis not present

## 2023-02-03 DIAGNOSIS — D472 Monoclonal gammopathy: Secondary | ICD-10-CM

## 2023-02-03 DIAGNOSIS — Z808 Family history of malignant neoplasm of other organs or systems: Secondary | ICD-10-CM | POA: Diagnosis not present

## 2023-02-03 DIAGNOSIS — Z79899 Other long term (current) drug therapy: Secondary | ICD-10-CM | POA: Diagnosis not present

## 2023-02-03 DIAGNOSIS — R5382 Chronic fatigue, unspecified: Secondary | ICD-10-CM | POA: Diagnosis not present

## 2023-02-03 DIAGNOSIS — M62838 Other muscle spasm: Secondary | ICD-10-CM | POA: Diagnosis not present

## 2023-02-03 MED ORDER — TRAMADOL HCL 50 MG PO TABS: 50.0000 mg | ORAL_TABLET | Freq: Three times a day (TID) | ORAL | 0 refills | Status: DC | PRN

## 2023-02-03 NOTE — Assessment & Plan Note (Signed)
# [  April-MAY 2023-]-IgM lambda 0.3 g/dL- MAY 2956-2.1 gm/dl ; K/L-= WNL.   CBC CMP- WNL.   #I again reviewed with the patient the natural history of MGUS; small risk of progression to multiple myeloma/lymphomas etc. Patient is less likely at this time patient has any active myeloma/lymphoma given lack of any clinically concerns at this time.  We will hold off any further work-up including bone marrow biopsy or any imaging at this time.   # Chronic Arthritis-? Osteoarthritis - Dr.Patel/ rheumatology. On celebrex; recommend tramadol prn; follow up appt with Rheum on June 20th. Defer X-rays.   # Restless leg- on gabapentin;   # DISPOSITION: # follow up in 12  months-  MD; 1-2 week PRIOR-  labs-cbc/cmp; LDH;iron studies; ferritin; MM panel; K/L light chains- Dr.B

## 2023-02-03 NOTE — Progress Notes (Signed)
April Nguyen  Patient Care Team: Doreene Nest, NP as PCP - General (Internal Medicine) Earna Coder, MD as Consulting Physician (Oncology)  CHIEF COMPLAINTS/PURPOSE OF CONSULTATION: Monoclonal gammopathy  HEMATOLOGY HISTORY  MARCH 2023- IgM 03 gm/dl ]Rheumatology; [facility][Immunofixation shows IgM monoclonal protein with lambda light chain  specificity; MAY 2023- IgM Lamda- 0.4 gm/dl; K/L=[place]. ZO10; creatinine- 0.58  #  rheumatology- joint pains  HISTORY OF PRESENTING ILLNESS: Alone. Independent ambulating.  April Nguyen 72 y.o.  female is here to review the results of her work-up ordered for monoclonal gammopathy.  C/o joint/neck pain getting worse, has appt with rheumatology  02/23/23. He suggested voltran cream. C/o muscle spasms in neck 9/10 when turning head.   Patient denies any new symptoms.  Chronic mild fatigue.  Complains difficulty sleeping because of the pain.   Review of Systems  Constitutional:  Positive for malaise/fatigue. Negative for chills, diaphoresis, fever and weight loss.  HENT:  Negative for nosebleeds and sore throat.   Eyes:  Negative for double vision.  Respiratory:  Negative for cough, hemoptysis, sputum production, shortness of breath and wheezing.   Cardiovascular:  Negative for chest pain, palpitations, orthopnea and leg swelling.  Gastrointestinal:  Negative for abdominal pain, blood in stool, constipation, diarrhea, heartburn, melena, nausea and vomiting.  Genitourinary:  Negative for dysuria, frequency and urgency.  Musculoskeletal:  Positive for back pain and joint pain.  Skin: Negative.  Negative for itching and rash.  Neurological:  Negative for dizziness, tingling, focal weakness, weakness and headaches.  Endo/Heme/Allergies:  Does not bruise/bleed easily.  Psychiatric/Behavioral:  Negative for depression. The patient is not nervous/anxious and does not have insomnia.     MEDICAL HISTORY:  Past Medical  History:  Diagnosis Date   Allergy    Seasonal   Anxiety    Arthritis    neck, wrists,hands, toes, and feet   COVID-19 09/08/2021   Depression    Eustachian tube dysfunction    Family history of adverse reaction to anesthesia    brother - PONV and combative   Genital warts    In past   GERD (gastroesophageal reflux disease)    History of shingles 04/16/2015   March 2016    history of Tumor of parotid gland    Hyperlipidemia    Meniere's disease    slight hearing loss, car sickness and dizziness   Migraines    sinus/stress   Monoclonal gammopathy    Polyarthralgia    PONV (postoperative nausea and vomiting)    and headaches and combative   Pre-diabetes    RLS (restless legs syndrome)    Seizures ([facility])    Dx 16 yrs ago - hormonal - none at least 3 yrs   Seizures ([facility])    H/O due to hormone issues    SURGICAL HISTORY: Past Surgical History:  Procedure Laterality Date   ABDOMINAL HYSTERECTOMY     CHOLECYSTECTOMY  2008   dialation and cartarage  1982, 1999, 2000   DISTAL INTERPHALANGEAL JOINT FUSION Left 05/24/2022   Procedure: Left index and ring DIP fusion;  Surgeon: Kennedy Bucker, MD;  Location: [facility];  Service: Orthopedics;  Laterality: Left;   FINGER SURGERY  2011, 2012   Bone fusion of middle finger right and left hands   FINGER SURGERY  2013   Pin removed from left middle finger   GANGLION CYST EXCISION  2011, 2012   Left and right hands   MYRINGOTOMY WITH TUBE PLACEMENT Bilateral 05/13/2015  Procedure: MYRINGOTOMY WITH  BUTTERFLY TUBE PLACEMENT;  Surgeon: Bud Face, MD;  Location: [facility];  Service: ENT;  Laterality: Bilateral;   MYRINGOTOMY WITH TUBE PLACEMENT Bilateral 08/28/2019   Procedure: MYRINGOTOMY WITH BUTTERFLY TUBE PLACEMENT;  Surgeon: Bud Face, MD;  Location: [facility];  Service: ENT;  Laterality: Bilateral;   PAROTID GLAND TUMOR EXCISION  1986   TONSILLECTOMY AND ADENOIDECTOMY  1959   TUBAL  LIGATION  2010    SOCIAL HISTORY: Social History   Socioeconomic History   Marital status: Married    Spouse name: Not on file   Number of children: Not on file   Years of education: Not on file   Highest education level: Not on file  Occupational History   Not on file  Tobacco Use   Smoking status: Never   Smokeless tobacco: Never  Vaping Use   Vaping Use: Never used  Substance and Sexual Activity   Alcohol use: No    Alcohol/week: 0.0 standard drinks of alcohol   Drug use: No   Sexual activity: Yes    Partners: Male    Comment: Husband  Other Topics Concern   Not on file  Social History Narrative   Work at the [employer] at [employer].  Lives with husband with [place]. Children- daughter and son;no smoking or alcohol.      -------------------------------------------------------------------------------    Grandchildren- 6    Caffeine- 1 cup in the morning, soda occasionally, green tea hot/cold   Enjoys reading, spending time at beach, spending time with family.   Social Determinants of Health   Financial Resource Strain: Low Risk  (02/14/2022)   Overall Financial Resource Strain (CARDIA)    Difficulty of Paying Living Expenses: Not hard at all  Food Insecurity: No Food Insecurity (02/14/2022)   Hunger Vital Sign    Worried About Running Out of Food in the Last Year: Never true    Ran Out of Food in the Last Year: Never true  Transportation Needs: No Transportation Needs (02/14/2022)   PRAPARE - Administrator, Civil Service (Medical): No    Lack of Transportation (Non-Medical): No  Physical Activity: Insufficiently Active (02/14/2022)   Exercise Vital Sign    Days of Exercise per Week: 3 days    Minutes of Exercise per Session: 20 min  Stress: No Stress Concern Present (02/14/2022)   [employer] of Occupational Health - Occupational Stress Questionnaire    Feeling of Stress : Not at all  Social Connections: Moderately Isolated  (02/14/2022)   Social Connection and Isolation Panel [NHANES]    Frequency of Communication with Friends and Family: More than three times a week    Frequency of Social Gatherings with Friends and Family: More than three times a week    Attends Religious Services: Never    Database administrator or Organizations: No    Attends Banker Meetings: Never    Marital Status: Married  Catering manager Violence: Not At Risk (02/14/2022)   Humiliation, Afraid, Rape, and Kick questionnaire    Fear of Current or Ex-Partner: No    Emotionally Abused: No    Physically Abused: No    Sexually Abused: No    FAMILY HISTORY: Family History  Problem Relation Age of Onset   Arthritis Mother    Hyperlipidemia Mother    Hypertension Mother    Stroke Mother    Heart disease Mother    Diabetes Mother    Hyperlipidemia Father  Heart disease Father    Arthritis Brother    Throat cancer Maternal Grandfather    Breast cancer Neg Hx     ALLERGIES:  is allergic to septra [sulfamethoxazole-trimethoprim].  MEDICATIONS:  Current Outpatient Medications  Medication Sig Dispense Refill   Ascorbic Acid (VITAMIN C) 1000 MG tablet Take 1,800 mg by mouth daily.     celecoxib (CELEBREX) 100 MG capsule Take 100 mg by mouth 2 (two) times daily. 100 mg AM, 200 mg at dinner     cetirizine (ZYRTEC) 10 MG tablet Take 1 tablet (10 mg total) by mouth daily. (Patient taking differently: Take 10 mg by mouth daily. am) 30 tablet 11   Ergocalciferol (VITAMIN D2) 10 MCG (400 UNIT) TABS Take 800 Units by mouth daily.     estradiol (ESTRACE) 0.1 MG/GM vaginal cream APPLY TWO TO THREE TIMES WEEKLY 42.5 g 0   fluticasone (FLONASE) 50 MCG/ACT nasal spray Place 2 sprays into both nostrils daily. 16 g 1   gabapentin (NEURONTIN) 300 MG capsule TAKE TWO CAPSULES BY MOUTH EVERY NIGHT AT BEDTIME FOR RESTLESS LEGS. 180 capsule 0   hydrochlorothiazide (HYDRODIURIL) 25 MG tablet Take 1 tablet (25 mg total) by mouth daily. for  blood pressure. (Patient taking differently: Take 25 mg by mouth daily.) 90 tablet 3   hydrOXYzine (ATARAX/VISTARIL) 10 MG tablet Take 1-2 tablets by mouth twice daily as needed for long car rides. 30 tablet 0   meclizine (ANTIVERT) 25 MG tablet Take 1 tablet (25 mg total) by mouth 3 (three) times daily as needed for dizziness (take as needed not with other antihistimines). 30 tablet 0   omeprazole (PRILOSEC) 20 MG capsule Take 1 capsule (20 mg total) by mouth daily. For heartburn 90 capsule 3   PARoxetine (PAXIL) 20 MG tablet Take 1 tablet (20 mg total) by mouth daily. For anxiety 90 tablet 2   rosuvastatin (CRESTOR) 10 MG tablet TAKE 1 TABLET BY MOUTH ONCE A DAY FOR CHOLESTEROL 90 tablet 2   traMADol (ULTRAM) 50 MG tablet Take 1 tablet (50 mg total) by mouth every 8 (eight) hours as needed. 30 tablet 0   No current facility-administered medications for this visit.      PHYSICAL EXAMINATION:   Vitals:   02/03/23 1040  BP: 125/70  Pulse: 92  Temp: 98.5 F (36.9 C)  SpO2: 100%   Filed Weights   02/03/23 1040  Weight: 151 lb 3.2 oz (68.6 kg)    Physical Exam Vitals and nursing Nguyen reviewed.  HENT:     Head: Normocephalic and atraumatic.     Mouth/Throat:     Pharynx: Oropharynx is clear.  Eyes:     Extraocular Movements: Extraocular movements intact.     Pupils: Pupils are equal, round, and reactive to light.  Cardiovascular:     Rate and Rhythm: Normal rate and regular rhythm.  Pulmonary:     Comments: Decreased breath sounds bilaterally.  Abdominal:     Palpations: Abdomen is soft.  Musculoskeletal:        General: Normal range of motion.     Cervical back: Normal range of motion.  Skin:    General: Skin is warm.  Neurological:     General: No focal deficit present.     Mental Status: She is alert and oriented to person, place, and time.  Psychiatric:        Behavior: Behavior normal.        Judgment: Judgment normal.     LABORATORY DATA:  I  have reviewed  the data as listed Lab Results  Component Value Date   WBC 5.1 01/23/2023   HGB 12.4 01/23/2023   HCT 36.8 01/23/2023   MCV 94.8 01/23/2023   PLT 333 01/23/2023   Recent Labs    05/24/22 1030 07/15/22 1124 01/23/23 1109  NA  --  134* 136  K 3.9 4.0 4.1  CL  --  101 102  CO2  --  25 25  GLUCOSE  --  94 101*  BUN  --  16 10  CREATININE  --  0.75 0.57  CALCIUM  --  8.7* 8.9  GFRNONAA  --  >60 >60  PROT  --  7.3 6.9  ALBUMIN  --  3.9 3.9  AST  --  18 19  ALT  --  15 14  ALKPHOS  --  55 49  BILITOT  --  0.5 0.5     No results found.  Lab Results  Component Value Date   KPAFRELGTCHN 16.7 01/23/2023   KPAFRELGTCHN 20.6 (H) 07/15/2022   KPAFRELGTCHN 23.2 (H) 12/29/2021   LAMBDASER 19.6 01/23/2023   LAMBDASER 16.7 07/15/2022   LAMBDASER 18.6 12/29/2021   KAPLAMBRATIO 0.85 01/23/2023   KAPLAMBRATIO 1.23 07/15/2022   KAPLAMBRATIO 1.25 12/29/2021     Monoclonal gammopathy # [April-MAY 2023-]-IgM lambda 0.3 g/dL- MAY 1610-9.6 gm/dl ; K/L-= WNL.   CBC CMP- WNL.   #I again reviewed with the patient the natural history of MGUS; small risk of progression to multiple myeloma/lymphomas etc. Patient is less likely at this time patient has any active myeloma/lymphoma given lack of any clinically concerns at this time.  We will hold off any further work-up including bone marrow biopsy or any imaging at this time.   # Chronic Arthritis-? Osteoarthritis - Dr.Patel/ rheumatology. On celebrex; recommend tramadol prn; follow up appt with Rheum on June 20th. Defer X-rays.   # Restless leg- on gabapentin;   # DISPOSITION: # follow up in 12  months-  MD; 1-2 week PRIOR-  labs-cbc/cmp; LDH;iron studies; ferritin; MM panel; K/L light chains- Dr.B  All questions were answered. The patient knows to call the clinic with any problems, questions or concerns.   Earna Coder, MD 02/03/2023 11:17 AM

## 2023-02-03 NOTE — Progress Notes (Signed)
C/o joint pain getting worse, has appt with rheumatology  02/23/23. He suggested voltran cream.  C/o muscle spasms in neck 9/10 when turning head.

## 2023-02-08 ENCOUNTER — Ambulatory Visit (INDEPENDENT_AMBULATORY_CARE_PROVIDER_SITE_OTHER): Payer: Medicare Other

## 2023-02-08 VITALS — Ht 63.0 in | Wt 151.0 lb

## 2023-02-08 DIAGNOSIS — Z0000 Encounter for general adult medical examination without abnormal findings: Secondary | ICD-10-CM | POA: Diagnosis not present

## 2023-02-08 DIAGNOSIS — Z1231 Encounter for screening mammogram for malignant neoplasm of breast: Secondary | ICD-10-CM | POA: Diagnosis not present

## 2023-02-08 NOTE — Progress Notes (Signed)
I connected with  April Nguyen on 02/08/23 by a audio enabled telemedicine application and verified that I am speaking with the correct person using two identifiers.  Patient Location: Home  Provider Location: Home Office  I discussed the limitations of evaluation and management by telemedicine. The patient expressed understanding and agreed to proceed.  Subjective:   April Nguyen is a 72 y.o. female who presents for Medicare Annual (Subsequent) preventive examination.  Review of Systems      Cardiac Risk Factors include: advanced age (>96men, >53 women);sedentary lifestyle     Objective:    Today's Vitals   02/08/23 1324 02/08/23 1325  Weight: 151 lb (68.5 kg)   Height: 5\' 3"  (1.6 m)   PainSc:  7    Body mass index is 26.75 kg/m.     02/08/2023    1:37 PM 02/03/2023   10:48 AM 07/25/2022    1:33 PM 05/24/2022   11:01 AM 02/14/2022    2:00 PM 01/21/2022    2:44 PM 12/29/2021    1:47 PM  Advanced Directives  Does Patient Have a Medical Advance Directive? No No No No No No No  Would patient like information on creating a medical advance directive? No - Patient declined Yes (MAU/Ambulatory/Procedural Areas - Information given) No - Patient declined No - Patient declined No - Patient declined No - Patient declined No - Patient declined    Current Medications (verified) Outpatient Encounter Medications as of 02/08/2023  Medication Sig   Ascorbic Acid (VITAMIN C) 1000 MG tablet Take 1,800 mg by mouth daily.   celecoxib (CELEBREX) 100 MG capsule Take 100 mg by mouth 2 (two) times daily. 100 mg AM, 200 mg at dinner   cetirizine (ZYRTEC) 10 MG tablet Take 1 tablet (10 mg total) by mouth daily. (Patient taking differently: Take 10 mg by mouth daily. am)   Ergocalciferol (VITAMIN D2) 10 MCG (400 UNIT) TABS Take 800 Units by mouth daily.   estradiol (ESTRACE) 0.1 MG/GM vaginal cream APPLY TWO TO THREE TIMES WEEKLY   fluticasone (FLONASE) 50 MCG/ACT nasal spray Place 2 sprays into  both nostrils daily.   gabapentin (NEURONTIN) 300 MG capsule TAKE TWO CAPSULES BY MOUTH EVERY NIGHT AT BEDTIME FOR RESTLESS LEGS.   hydrochlorothiazide (HYDRODIURIL) 25 MG tablet Take 1 tablet (25 mg total) by mouth daily. for blood pressure. (Patient taking differently: Take 25 mg by mouth daily.)   hydrOXYzine (ATARAX/VISTARIL) 10 MG tablet Take 1-2 tablets by mouth twice daily as needed for long car rides.   meclizine (ANTIVERT) 25 MG tablet Take 1 tablet (25 mg total) by mouth 3 (three) times daily as needed for dizziness (take as needed not with other antihistimines).   omeprazole (PRILOSEC) 20 MG capsule Take 1 capsule (20 mg total) by mouth daily. For heartburn   PARoxetine (PAXIL) 20 MG tablet Take 1 tablet (20 mg total) by mouth daily. For anxiety   rosuvastatin (CRESTOR) 10 MG tablet TAKE 1 TABLET BY MOUTH ONCE A DAY FOR CHOLESTEROL   traMADol (ULTRAM) 50 MG tablet Take 1 tablet (50 mg total) by mouth every 8 (eight) hours as needed.   No facility-administered encounter medications on file as of 02/08/2023.    Allergies (verified) Septra [sulfamethoxazole-trimethoprim]   History: Past Medical History:  Diagnosis Date   Allergy    Seasonal   Anxiety    Arthritis    neck, wrists,hands, toes, and feet   COVID-19 09/08/2021   Depression    Eustachian tube dysfunction  Family history of adverse reaction to anesthesia    brother - PONV and combative   Genital warts    In past   GERD (gastroesophageal reflux disease)    History of shingles 04/16/2015   March 2016    history of Tumor of parotid gland    Hyperlipidemia    Meniere's disease    slight hearing loss, car sickness and dizziness   Migraines    sinus/stress   Monoclonal gammopathy    Polyarthralgia    PONV (postoperative nausea and vomiting)    and headaches and combative   Pre-diabetes    RLS (restless legs syndrome)    Seizures (HCC)    Dx 16 yrs ago - hormonal - none at least 3 yrs   Seizures (HCC)     H/O due to hormone issues   Past Surgical History:  Procedure Laterality Date   ABDOMINAL HYSTERECTOMY     CHOLECYSTECTOMY  2008   dialation and cartarage  1982, 1999, 2000   DISTAL INTERPHALANGEAL JOINT FUSION Left 05/24/2022   Procedure: Left index and ring DIP fusion;  Surgeon: Kennedy Bucker, MD;  Location: [facility];  Service: Orthopedics;  Laterality: Left;   FINGER SURGERY  2011, 2012   Bone fusion of middle finger right and left hands   FINGER SURGERY  2013   Pin removed from left middle finger   GANGLION CYST EXCISION  2011, 2012   Left and right hands   MYRINGOTOMY WITH TUBE PLACEMENT Bilateral 05/13/2015   Procedure: MYRINGOTOMY WITH  BUTTERFLY TUBE PLACEMENT;  Surgeon: Bud Face, MD;  Location: [facility];  Service: ENT;  Laterality: Bilateral;   MYRINGOTOMY WITH TUBE PLACEMENT Bilateral 08/28/2019   Procedure: MYRINGOTOMY WITH BUTTERFLY TUBE PLACEMENT;  Surgeon: Bud Face, MD;  Location: [facility];  Service: ENT;  Laterality: Bilateral;   PAROTID GLAND TUMOR EXCISION  1986   TONSILLECTOMY AND ADENOIDECTOMY  1959   TUBAL LIGATION  2010   Family History  Problem Relation Age of Onset   Arthritis Mother    Hyperlipidemia Mother    Hypertension Mother    Stroke Mother    Heart disease Mother    Diabetes Mother    Hyperlipidemia Father    Heart disease Father    Arthritis Brother    Throat cancer Maternal Grandfather    Breast cancer Neg Hx    Social History   Socioeconomic History   Marital status: Married    Spouse name: Not on file   Number of children: Not on file   Years of education: Not on file   Highest education level: Not on file  Occupational History   Not on file  Tobacco Use   Smoking status: Never   Smokeless tobacco: Never  Vaping Use   Vaping Use: Never used  Substance and Sexual Activity   Alcohol use: No    Alcohol/week: 0.0 standard drinks of alcohol   Drug use: No   Sexual activity: Yes     Partners: Male    Comment: Husband  Other Topics Concern   Not on file  Social History Narrative   Work at the [employer] at [employer].  Lives with husband with [place]. Children- daughter and son;no smoking or alcohol.      -------------------------------------------------------------------------------    Grandchildren- 6    Caffeine- 1 cup in the morning, soda occasionally, green tea hot/cold   Enjoys reading, spending time at beach, spending time with family.   Social Determinants of  Health   Financial Resource Strain: Low Risk  (02/08/2023)   Overall Financial Resource Strain (CARDIA)    Difficulty of Paying Living Expenses: Not hard at all  Food Insecurity: No Food Insecurity (02/08/2023)   Hunger Vital Sign    Worried About Running Out of Food in the Last Year: Never true    Ran Out of Food in the Last Year: Never true  Transportation Needs: No Transportation Needs (02/08/2023)   PRAPARE - Administrator, Civil Service (Medical): No    Lack of Transportation (Non-Medical): No  Physical Activity: Inactive (02/08/2023)   Exercise Vital Sign    Days of Exercise per Week: 0 days    Minutes of Exercise per Session: 0 min  Stress: Stress Concern Present (02/08/2023)   [employer] of Occupational Health - Occupational Stress Questionnaire    Feeling of Stress : To some extent  Social Connections: Moderately Isolated (02/08/2023)   Social Connection and Isolation Panel [NHANES]    Frequency of Communication with Friends and Family: More than three times a week    Frequency of Social Gatherings with Friends and Family: More than three times a week    Attends Religious Services: Never    Database administrator or Organizations: No    Attends Engineer, structural: Never    Marital Status: Married    Tobacco Counseling Counseling given: Not Answered   Clinical Intake:  Pre-visit preparation completed: Yes  Pain : 0-10 Pain Score: 7  Pain  Type: Chronic pain Pain Location: Generalized Pain Descriptors / Indicators: Aching, Constant     Nutritional Risks: None Diabetes: No  How often do you need to have someone help you when you read instructions, pamphlets, or other written materials from your doctor or pharmacy?: 1 - Never  Diabetic? no  Interpreter Needed?: No  Information entered by :: April Nephew LPN   Activities of Daily Living    02/08/2023    1:39 PM 02/08/2023    8:52 AM  In your present state of health, do you have any difficulty performing the following activities:  Hearing? 0 1  Vision? 1 0  Comment S/P cataract  removal getting new prescription in 4 weeks   Difficulty concentrating or making decisions? 0 0  Walking or climbing stairs? 0 0  Dressing or bathing? 0 0  Doing errands, shopping? 0 0  Preparing Food and eating ? N N  Using the Toilet? N N  In the past six months, have you accidently leaked urine? Y Y  Comment occasionally   Do you have problems with loss of bowel control? N   Managing your Medications? N N  Managing your Finances? N N  Housekeeping or managing your Housekeeping? N N    Patient Care Team: Doreene Nest, NP as PCP - General (Internal Medicine) Earna Coder, MD as Consulting Physician (Oncology)  Indicate any recent Medical Services you may have received from other than [facility] providers in the past year (date may be approximate).     Assessment:   This is a routine wellness examination for April Nguyen.  Hearing/Vision screen Hearing Screening - Comments:: [place]- no aids Vision Screening - Comments:: Glasses - Dr.Nice  Dietary issues and exercise activities discussed: Current Exercise Habits: The patient does not participate in regular exercise at present, Exercise limited by: None identified   Goals Addressed             This Visit's Progress    Patient Stated  No new goals       Depression Screen    02/08/2023    1:36 PM 03/30/2022    11:44 AM 02/14/2022    1:54 PM 03/09/2021    8:57 AM 02/17/2017   10:37 AM  PHQ 2/9 Scores  PHQ - 2 Score 0 0 0 0 0  PHQ- 9 Score  0       Fall Risk    02/08/2023    1:38 PM 02/08/2023    8:52 AM 02/14/2022    1:59 PM 03/09/2021    8:58 AM  Fall Risk   Falls in the past year? 0 0 0 0  Number falls in past yr: 0  0 0  Injury with Fall? 0  0 0  Risk for fall due to : No Fall Risks  No Fall Risks   Follow up Falls prevention discussed;Falls evaluation completed       FALL RISK PREVENTION PERTAINING TO THE HOME:  Any stairs in or around the home? Yes  If so, are there any without handrails? No  Home free of loose throw rugs in walkways, pet beds, electrical cords, etc? Yes  Adequate lighting in your home to reduce risk of falls? Yes   ASSISTIVE DEVICES UTILIZED TO PREVENT FALLS:  Life alert? No  Use of a cane, walker or w/c? No  Grab bars in the bathroom? Yes  Shower chair or bench in shower? No  Elevated toilet seat or a handicapped toilet? Yes   Cognitive Function:        02/08/2023    1:40 PM 02/17/2022   10:26 AM 02/14/2022    2:00 PM  6CIT Screen  What Year? 0 points 0 points 0 points  What month? 0 points 0 points 0 points  What time? 0 points 0 points 0 points  Count back from 20 0 points 0 points 0 points  Months in reverse 0 points 0 points 0 points  Repeat phrase 0 points 0 points 0 points  Total Score 0 points 0 points 0 points    Immunizations Immunization History  Administered Date(s) Administered   Influenza,inj,Quad PF,6+ Mos 05/28/2019   Moderna Sars-Covid-2 Vaccination 09/13/2019, 10/04/2019   Pneumococcal Conjugate-13 03/31/2020   Pneumococcal Polysaccharide-23 02/26/2019   Td 02/17/2017   Zoster, Live 10/26/2015    TDAP status: Up to date  Flu Vaccine status: Up to date  Pneumococcal vaccine status: Up to date  Covid-19 vaccine status: Information provided on how to obtain vaccines.   Qualifies for Shingles Vaccine? Yes   Zostavax completed  Yes   Shingrix Completed?: No.    Education has been provided regarding the importance of this vaccine. Patient has been advised to call insurance company to determine out of pocket expense if they have not yet received this vaccine. Advised may also receive vaccine at local pharmacy or Health Dept. Verbalized acceptance and understanding.  Screening Tests Health Maintenance  Topic Date Due   Zoster Vaccines- Shingrix (1 of 2) Never done   Colonoscopy  09/10/2022   INFLUENZA VACCINE  04/06/2023   Medicare Annual Wellness (AWV)  02/08/2024   MAMMOGRAM  05/13/2024   DTaP/Tdap/Td (2 - Tdap) 02/18/2027   Pneumonia Vaccine 85+ Years old  Completed   DEXA SCAN  Completed   Hepatitis C Screening  Completed   HPV VACCINES  Aged Out   COVID-19 Vaccine  Discontinued    Health Maintenance  Health Maintenance Due  Topic Date Due   Zoster Vaccines- Shingrix (1  of 2) Never done   Colonoscopy  09/10/2022    Colorectal cancer screening: Type of screening: Colonoscopy. Completed 09/10/12. Repeat every 10 years Pt will call [facility] to Schedule.  Mammogram status: Completed 05/13/2022. Repeat every year  Bone Density status: Completed 05/13/22. Results reflect: Bone density results: OSTEOPENIA. Repeat every 2 years.  Lung Cancer Screening: (Low Dose CT Chest recommended if Age 33-80 years, 30 pack-year currently smoking OR have quit w/in 15years.) does not qualify.   Lung Cancer Screening Referral: no  Additional Screening:  Hepatitis C Screening: does qualify; Completed 02/19/19  Vision Screening: Recommended annual ophthalmology exams for early detection of glaucoma and other disorders of the eye. Is the patient up to date with their annual eye exam?  Yes  Who is the provider or what is the name of the office in which the patient attends annual eye exams? Dr.Nice If pt is not established with a provider, would they like to be referred to a provider to establish care? Yes .    Dental Screening: Recommended annual dental exams for proper oral hygiene  Community Resource Referral / Chronic Care Management: CRR required this visit?  No   CCM required this visit?  No      Plan:     I have personally reviewed and noted the following in the patient's chart:   Medical and social history Use of alcohol, tobacco or illicit drugs  Current medications and supplements including opioid prescriptions. Patient is currently taking opioid prescriptions. Information provided to patient regarding non-opioid alternatives. Patient advised to discuss non-opioid treatment plan with their provider. Functional ability and status Nutritional status Physical activity Advanced directives List of other physicians Hospitalizations, surgeries, and ER visits in previous 12 months Vitals Screenings to include cognitive, depression, and falls Referrals and appointments  In addition, I have reviewed and discussed with patient certain preventive protocols, quality metrics, and best practice recommendations. A written personalized care plan for preventive services as well as general preventive health recommendations were provided to patient.     Maryan Puls, LPN   12/08/4096   Nurse Notes: order placed for mammogram

## 2023-02-08 NOTE — Patient Instructions (Addendum)
Ms. April Nguyen , Thank you for taking time to come for your Medicare Wellness Visit. I appreciate your ongoing commitment to your health goals. Please review the following plan we discussed and let me know if I can assist you in the future.   These are the goals we discussed:  Goals       Increase physical activity (pt-stated)      Patient Stated      No new goals        This is a list of the screening recommended for you and due dates:  Health Maintenance  Topic Date Due   Zoster (Shingles) Vaccine (1 of 2) Never done   Colon Cancer Screening  09/10/2022   Flu Shot  04/06/2023   Medicare Annual Wellness Visit  02/08/2024   Mammogram  05/13/2024   DTaP/Tdap/Td vaccine (2 - Tdap) 02/18/2027   Pneumonia Vaccine  Completed   DEXA scan (bone density measurement)  Completed   Hepatitis C Screening  Completed   HPV Vaccine  Aged Out   COVID-19 Vaccine  Discontinued     Advanced directives: none  Conditions/risks identified: Aim for 30 minutes of exercise or brisk walking, 6-8 glasses of water, and 5 servings of fruits and vegetables each day.   Next appointment: Follow up in one year for your annual wellness visit 02/12/24 @ 1:00 televisit   Preventive Care 65 Years and Older, Female Preventive care refers to lifestyle choices and visits with your health care provider that can promote health and wellness. What does preventive care include? A yearly physical exam. This is also called an annual well check. Dental exams once or twice a year. Routine eye exams. Ask your health care provider how often you should have your eyes checked. Personal lifestyle choices, including: Daily care of your teeth and gums. Regular physical activity. Eating a healthy diet. Avoiding tobacco and drug use. Limiting alcohol use. Practicing safe sex. Taking low-dose aspirin every day. Taking vitamin and mineral supplements as recommended by your health care provider. What happens during an annual well  check? The services and screenings done by your health care provider during your annual well check will depend on your age, overall health, lifestyle risk factors, and family history of disease. Counseling  Your health care provider may ask you questions about your: Alcohol use. Tobacco use. Drug use. Emotional well-being. Home and relationship well-being. Sexual activity. Eating habits. History of falls. Memory and ability to understand (cognition). Work and work Astronomer. Reproductive health. Screening  You may have the following tests or measurements: Height, weight, and BMI. Blood pressure. Lipid and cholesterol levels. These may be checked every 5 years, or more frequently if you are over 25 years old. Skin check. Lung cancer screening. You may have this screening every year starting at age 17 if you have a 30-pack-year history of smoking and currently smoke or have quit within the past 15 years. Fecal occult blood test (FOBT) of the stool. You may have this test every year starting at age 20. Flexible sigmoidoscopy or colonoscopy. You may have a sigmoidoscopy every 5 years or a colonoscopy every 10 years starting at age 61. Hepatitis C blood test. Hepatitis B blood test. Sexually transmitted disease (STD) testing. Diabetes screening. This is done by checking your blood sugar (glucose) after you have not eaten for a while (fasting). You may have this done every 1-3 years. Bone density scan. This is done to screen for osteoporosis. You may have this done starting at  age 78. Mammogram. This may be done every 1-2 years. Talk to your health care provider about how often you should have regular mammograms. Talk with your health care provider about your test results, treatment options, and if necessary, the need for more tests. Vaccines  Your health care provider may recommend certain vaccines, such as: Influenza vaccine. This is recommended every year. Tetanus, diphtheria, and  acellular pertussis (Tdap, Td) vaccine. You may need a Td booster every 10 years. Zoster vaccine. You may need this after age 64. Pneumococcal 13-valent conjugate (PCV13) vaccine. One dose is recommended after age 57. Pneumococcal polysaccharide (PPSV23) vaccine. One dose is recommended after age 62. Talk to your health care provider about which screenings and vaccines you need and how often you need them. This information is not intended to replace advice given to you by your health care provider. Make sure you discuss any questions you have with your health care provider. Document Released: 09/18/2015 Document Revised: 05/11/2016 Document Reviewed: 06/23/2015 Elsevier Interactive Patient Education  2017 [employer].  Fall Prevention in the Home Falls can cause injuries. They can happen to people of all ages. There are many things you can do to make your home safe and to help prevent falls. What can I do on the outside of my home? Regularly fix the edges of walkways and driveways and fix any cracks. Remove anything that might make you trip as you walk through a door, such as a raised step or threshold. Trim any bushes or trees on the path to your home. Use bright outdoor lighting. Clear any walking paths of anything that might make someone trip, such as rocks or tools. Regularly check to see if handrails are loose or broken. Make sure that both sides of any steps have handrails. Any raised decks and porches should have guardrails on the edges. Have any leaves, snow, or ice cleared regularly. Use sand or salt on walking paths during winter. Clean up any spills in your garage right away. This includes oil or grease spills. What can I do in the bathroom? Use night lights. Install grab bars by the toilet and in the tub and shower. Do not use towel bars as grab bars. Use non-skid mats or decals in the tub or shower. If you need to sit down in the shower, use a plastic, non-slip stool. Keep  the floor dry. Clean up any water that spills on the floor as soon as it happens. Remove soap buildup in the tub or shower regularly. Attach bath mats securely with double-sided non-slip rug tape. Do not have throw rugs and other things on the floor that can make you trip. What can I do in the bedroom? Use night lights. Make sure that you have a light by your bed that is easy to reach. Do not use any sheets or blankets that are too big for your bed. They should not hang down onto the floor. Have a firm chair that has side arms. You can use this for support while you get dressed. Do not have throw rugs and other things on the floor that can make you trip. What can I do in the kitchen? Clean up any spills right away. Avoid walking on wet floors. Keep items that you use a lot in easy-to-reach places. If you need to reach something above you, use a strong step stool that has a grab bar. Keep electrical cords out of the way. Do not use floor polish or wax that makes floors  slippery. If you must use wax, use non-skid floor wax. Do not have throw rugs and other things on the floor that can make you trip. What can I do with my stairs? Do not leave any items on the stairs. Make sure that there are handrails on both sides of the stairs and use them. Fix handrails that are broken or loose. Make sure that handrails are as long as the stairways. Check any carpeting to make sure that it is firmly attached to the stairs. Fix any carpet that is loose or worn. Avoid having throw rugs at the top or bottom of the stairs. If you do have throw rugs, attach them to the floor with carpet tape. Make sure that you have a light switch at the top of the stairs and the bottom of the stairs. If you do not have them, ask someone to add them for you. What else can I do to help prevent falls? Wear shoes that: Do not have high heels. Have rubber bottoms. Are comfortable and fit you well. Are closed at the toe. Do not  wear sandals. If you use a stepladder: Make sure that it is fully opened. Do not climb a closed stepladder. Make sure that both sides of the stepladder are locked into place. Ask someone to hold it for you, if possible. Clearly mark and make sure that you can see: Any grab bars or handrails. First and last steps. Where the edge of each step is. Use tools that help you move around (mobility aids) if they are needed. These include: Canes. Walkers. Scooters. Crutches. Turn on the lights when you go into a dark area. Replace any light bulbs as soon as they burn out. Set up your furniture so you have a clear path. Avoid moving your furniture around. If any of your floors are uneven, fix them. If there are any pets around you, be aware of where they are. Review your medicines with your doctor. Some medicines can make you feel dizzy. This can increase your chance of falling. Ask your doctor what other things that you can do to help prevent falls. This information is not intended to replace advice given to you by your health care provider. Make sure you discuss any questions you have with your health care provider. Document Released: 06/18/2009 Document Revised: 01/28/2016 Document Reviewed: 09/26/2014 Elsevier Interactive Patient Education  2017 [employer].

## 2023-02-17 ENCOUNTER — Other Ambulatory Visit: Payer: Self-pay | Admitting: Primary Care

## 2023-02-17 DIAGNOSIS — E785 Hyperlipidemia, unspecified: Secondary | ICD-10-CM

## 2023-02-17 DIAGNOSIS — F418 Other specified anxiety disorders: Secondary | ICD-10-CM

## 2023-02-23 DIAGNOSIS — M159 Polyosteoarthritis, unspecified: Secondary | ICD-10-CM | POA: Diagnosis not present

## 2023-02-23 DIAGNOSIS — M542 Cervicalgia: Secondary | ICD-10-CM | POA: Diagnosis not present

## 2023-02-23 DIAGNOSIS — G8929 Other chronic pain: Secondary | ICD-10-CM | POA: Diagnosis not present

## 2023-02-27 ENCOUNTER — Ambulatory Visit (INDEPENDENT_AMBULATORY_CARE_PROVIDER_SITE_OTHER): Payer: Medicare Other | Admitting: Primary Care

## 2023-02-27 ENCOUNTER — Encounter: Payer: Self-pay | Admitting: Primary Care

## 2023-02-27 VITALS — BP 118/62 | HR 82 | Temp 97.7°F | Ht 63.0 in | Wt 149.0 lb

## 2023-02-27 DIAGNOSIS — M1990 Unspecified osteoarthritis, unspecified site: Secondary | ICD-10-CM | POA: Diagnosis not present

## 2023-02-27 DIAGNOSIS — F418 Other specified anxiety disorders: Secondary | ICD-10-CM | POA: Diagnosis not present

## 2023-02-27 DIAGNOSIS — R232 Flushing: Secondary | ICD-10-CM

## 2023-02-27 DIAGNOSIS — R5382 Chronic fatigue, unspecified: Secondary | ICD-10-CM | POA: Insufficient documentation

## 2023-02-27 LAB — POC URINALSYSI DIPSTICK (AUTOMATED)
Bilirubin, UA: NEGATIVE
Blood, UA: NEGATIVE
Glucose, UA: NEGATIVE
Ketones, UA: NEGATIVE
Nitrite, UA: POSITIVE
Protein, UA: NEGATIVE
Spec Grav, UA: 1.015 (ref 1.010–1.025)
Urobilinogen, UA: 0.2 E.U./dL
pH, UA: 5.5 (ref 5.0–8.0)

## 2023-02-27 LAB — T4, FREE: Free T4: 1 ng/dL (ref 0.60–1.60)

## 2023-02-27 LAB — TSH: TSH: 0.61 u[IU]/mL (ref 0.35–5.50)

## 2023-02-27 NOTE — Assessment & Plan Note (Signed)
Uncontrolled.  Checking thyroid studies today. Increase paroxetine to 40 mg daily.  She will update in a few weeks.

## 2023-02-27 NOTE — Assessment & Plan Note (Signed)
Will hold off on switching to duloxetine per patient preference.  Consider duloxetine in the future if the increased dose of paroxetine is ineffective for hot flashes and anxiety/depression.

## 2023-02-27 NOTE — Assessment & Plan Note (Addendum)
Increasing paroxetine to 40 mg.  She has plenty of the 20 mg tablets at home, so she will double up to make 40 mg.  She will notify when she is running low. Offered therapy for which she kindly declines, she has participated in a local group through her insurance company. She will seek out support group for Alzheimer's.  She will update in about 4 weeks.

## 2023-02-27 NOTE — Assessment & Plan Note (Addendum)
Reviewed labs from hematology from late May 2024. Iron and CBC are within normal range. Vitamin B12 within normal range, but on lower end.  Less likely sleep apnea.  She is under a lot of stress, so depression and anxiety could be contributing.  Add thyroid studies today. UA today with trace leuks, positive nitrites. Culture ordered and pending.  Increase paroxetine to 40 mg daily for anxiety/depression and hot flashes. Add B12 200-500 mcg daily.   Recommend she switch taking Paxil in AM to PM as this could be causing her drowsiness.

## 2023-02-27 NOTE — Progress Notes (Signed)
Subjective:    Patient ID: April Nguyen, female    DOB: [birth year], 72 y.o.   MRN: [id number]  HPI  April Nguyen is a very pleasant 72 y.o. female with a history of arthritis, depression with anxiety, insomnia, hyperlipidemia, prediabetes, restless legs, monoclonal gammopathy, Mnire's disease who presents today to discuss several concerns.  1) Chronic Fatigue: Chronic for the last 7-8 months. She wakes up with chronic joint pain and stiffness. She feels well rested for the most part. She is sleeping well with gabapentin 600 mg HS, will sleep about 4-6 hours without waking which is an improvement. She will start feeling tired around 2 pm, does nap several days weekly for 1-2 hours. She does to bed around 11 pm.   She wakes 2 times during the night to use the bathroom and to watch her husband who has Alzhemiers and sleep apnea. She denies snoring personally and as never been told that she has apnea.   Following with hematology/oncology for MGUS, last visit was in 02/03/23, underwent lab work and was initiated on Tramadol 50 mg PRN for increased neck pain. She takes this sparingly.   She has a family history of thyroid disease in her daughter and two nieces. She has noticed decrease in appetite. She denies vaginal and rectal bleeding, dysuria, urinary frequency.   2) Chronic Joint Pain/Depression: Currently following with rheumatology, last office visit was 02/23/2023.  During this visit she mentioned continued pain to her hands and cervical spine despite management on Celebrex and gabapentin.  It was recommended by her rheumatologist that she switch from paroxetine to duloxetine for better pain management.  She is currently managed on paroxetine for vasomotor symptoms of hot flashes. She continues to notice hot flashes and sweating, especially in the Summer heat. She is managed on Estrace vaginal cream every 2-3 days which helps with urinary symptoms. She does feel that paroxetine helps with  depression/anxiety.   She is reticent in switching to duloxetine for pain management of her joints, because she is worried her hot flashes may get worse if she comes off paroxetine. She takes paroxetine in the morning.  Wt Readings from Last 3 Encounters:  02/27/23 149 lb (67.6 kg)  02/08/23 151 lb (68.5 kg)  02/03/23 151 lb 3.2 oz (68.6 kg)     Review of Systems  Constitutional:  Positive for fatigue.  Gastrointestinal:  Negative for anal bleeding and blood in stool.  Genitourinary:  Negative for dysuria, frequency and vaginal bleeding.       Hot flashes  Psychiatric/Behavioral:  The patient is nervous/anxious.          Past Medical History:  Diagnosis Date   Allergy    Seasonal   Anxiety    Arthritis    neck, wrists,hands, toes, and feet   COVID-19 09/08/2021   Depression    Eustachian tube dysfunction    Family history of adverse reaction to anesthesia    brother - PONV and combative   Genital warts    In past   GERD (gastroesophageal reflux disease)    History of shingles 04/16/2015   March 2016    history of Tumor of parotid gland    Hyperlipidemia    Meniere's disease    slight hearing loss, car sickness and dizziness   Migraines    sinus/stress   Monoclonal gammopathy    Polyarthralgia    PONV (postoperative nausea and vomiting)    and headaches and combative   Pre-diabetes  RLS (restless legs syndrome)    Seizures (HCC)    Dx 16 yrs ago - hormonal - none at least 3 yrs   Seizures (HCC)    H/O due to hormone issues    Social History   Socioeconomic History   Marital status: Married    Spouse name: Not on file   Number of children: Not on file   Years of education: Not on file   Highest education level: Not on file  Occupational History   Not on file  Tobacco Use   Smoking status: Never   Smokeless tobacco: Never  Vaping Use   Vaping Use: Never used  Substance and Sexual Activity   Alcohol use: No    Alcohol/week: 0.0 standard drinks  of alcohol   Drug use: No   Sexual activity: Yes    Partners: Male    Comment: Husband  Other Topics Concern   Not on file  Social History Narrative   Work at the [employer] at [employer].  Lives with husband with [place]. Children- daughter and son;no smoking or alcohol.      -------------------------------------------------------------------------------    Grandchildren- 6    Caffeine- 1 cup in the morning, soda occasionally, green tea hot/cold   Enjoys reading, spending time at beach, spending time with family.   Social Determinants of Health   Financial Resource Strain: Low Risk  (02/23/2023)   Overall Financial Resource Strain (CARDIA)    Difficulty of Paying Living Expenses: Not hard at all  Food Insecurity: No Food Insecurity (02/23/2023)   Hunger Vital Sign    Worried About Running Out of Food in the Last Year: Never true    Ran Out of Food in the Last Year: Never true  Transportation Needs: No Transportation Needs (02/23/2023)   PRAPARE - Administrator, Civil Service (Medical): No    Lack of Transportation (Non-Medical): No  Physical Activity: Insufficiently Active (02/23/2023)   Exercise Vital Sign    Days of Exercise per Week: 3 days    Minutes of Exercise per Session: 20 min  Stress: Stress Concern Present (02/23/2023)   [employer] of Occupational Health - Occupational Stress Questionnaire    Feeling of Stress : To some extent  Social Connections: Moderately Integrated (02/23/2023)   Social Connection and Isolation Panel [NHANES]    Frequency of Communication with Friends and Family: More than three times a week    Frequency of Social Gatherings with Friends and Family: More than three times a week    Attends Religious Services: More than 4 times per year    Active Member of [employer] or Organizations: Patient declined    Attends Banker Meetings: Never    Marital Status: Married  Recent Concern: Social Connections -  Moderately Isolated (02/08/2023)   Social Connection and Isolation Panel [NHANES]    Frequency of Communication with Friends and Family: More than three times a week    Frequency of Social Gatherings with Friends and Family: More than three times a week    Attends Religious Services: Never    Database administrator or Organizations: No    Attends Banker Meetings: Never    Marital Status: Married  Catering manager Violence: Not At Risk (02/08/2023)   Humiliation, Afraid, Rape, and Kick questionnaire    Fear of Current or Ex-Partner: No    Emotionally Abused: No    Physically Abused: No    Sexually Abused: No    Past  Surgical History:  Procedure Laterality Date   ABDOMINAL HYSTERECTOMY     CATARACT EXTRACTION Bilateral 01/2023   CHOLECYSTECTOMY  09/05/2006   dialation and cartarage  1982, 1999, 2000   DISTAL INTERPHALANGEAL JOINT FUSION Left 05/24/2022   Procedure: Left index and ring DIP fusion;  Surgeon: Kennedy Bucker, MD;  Location: [facility];  Service: Orthopedics;  Laterality: Left;   FINGER SURGERY  2011, 2012   Bone fusion of middle finger right and left hands   FINGER SURGERY  09/06/2011   Pin removed from left middle finger   GANGLION CYST EXCISION  2011, 2012   Left and right hands   MYRINGOTOMY WITH TUBE PLACEMENT Bilateral 05/13/2015   Procedure: MYRINGOTOMY WITH  BUTTERFLY TUBE PLACEMENT;  Surgeon: Bud Face, MD;  Location: [facility];  Service: ENT;  Laterality: Bilateral;   MYRINGOTOMY WITH TUBE PLACEMENT Bilateral 08/28/2019   Procedure: MYRINGOTOMY WITH BUTTERFLY TUBE PLACEMENT;  Surgeon: Bud Face, MD;  Location: [facility];  Service: ENT;  Laterality: Bilateral;   PAROTID GLAND TUMOR EXCISION  09/05/1984   TONSILLECTOMY AND ADENOIDECTOMY  09/05/1957   TUBAL LIGATION  09/05/2008    Family History  Problem Relation Age of Onset   Arthritis Mother    Hyperlipidemia Mother    Hypertension Mother    Stroke  Mother    Heart disease Mother    Diabetes Mother    Hyperlipidemia Father    Heart disease Father    Arthritis Brother    Throat cancer Maternal Grandfather    Breast cancer Neg Hx     Allergies  Allergen Reactions   Septra [Sulfamethoxazole-Trimethoprim] Rash    Current Outpatient Medications on File Prior to Visit  Medication Sig Dispense Refill   Ascorbic Acid (VITAMIN C) 1000 MG tablet Take 1,800 mg by mouth daily.     celecoxib (CELEBREX) 100 MG capsule Take 100 mg by mouth 2 (two) times daily. 100 mg AM, 200 mg at dinner     cetirizine (ZYRTEC) 10 MG tablet Take 1 tablet (10 mg total) by mouth daily. (Patient taking differently: Take 10 mg by mouth daily. am) 30 tablet 11   Ergocalciferol (VITAMIN D2) 10 MCG (400 UNIT) TABS Take 800 Units by mouth daily.     estradiol (ESTRACE) 0.1 MG/GM vaginal cream APPLY TWO TO THREE TIMES WEEKLY 42.5 g 0   fluticasone (FLONASE) 50 MCG/ACT nasal spray Place 2 sprays into both nostrils daily. 16 g 1   gabapentin (NEURONTIN) 300 MG capsule TAKE TWO CAPSULES BY MOUTH EVERY NIGHT AT BEDTIME FOR RESTLESS LEGS. 180 capsule 0   hydrochlorothiazide (HYDRODIURIL) 25 MG tablet Take 1 tablet (25 mg total) by mouth daily. for blood pressure. (Patient taking differently: Take 25 mg by mouth daily.) 90 tablet 3   hydrOXYzine (ATARAX/VISTARIL) 10 MG tablet Take 1-2 tablets by mouth twice daily as needed for long car rides. 30 tablet 0   meclizine (ANTIVERT) 25 MG tablet Take 1 tablet (25 mg total) by mouth 3 (three) times daily as needed for dizziness (take as needed not with other antihistimines). 30 tablet 0   omeprazole (PRILOSEC) 20 MG capsule Take 1 capsule (20 mg total) by mouth daily. For heartburn 90 capsule 3   PARoxetine (PAXIL) 20 MG tablet TAKE ONE TABLET BY MOUTH ONCE DAILY FOR ANXIETY 90 tablet 0   rosuvastatin (CRESTOR) 10 MG tablet TAKE ONE TABLET BY MOUTH ONCE A DAY FOR CHOLESTEROL 90 tablet 0   tiZANidine (ZANAFLEX) 2 MG tablet Take by  mouth.     traMADol (ULTRAM) 50 MG tablet Take 1 tablet (50 mg total) by mouth every 8 (eight) hours as needed. 30 tablet 0   No current facility-administered medications on file prior to visit.    BP 118/62   Pulse 82   Temp 97.7 F (36.5 C) (Temporal)   Ht 5\' 3"  (1.6 m)   Wt 149 lb (67.6 kg)   SpO2 99%   BMI 26.39 kg/m  Objective:   Physical Exam Cardiovascular:     Rate and Rhythm: Normal rate and regular rhythm.  Pulmonary:     Effort: Pulmonary effort is normal.     Breath sounds: Normal breath sounds.  Musculoskeletal:     Cervical back: Neck supple.  Skin:    General: Skin is warm and dry.  Neurological:     Mental Status: She is alert and oriented to person, place, and time.  Psychiatric:        Mood and Affect: Mood normal.           Assessment & Plan:  Chronic fatigue Assessment & Plan: Reviewed labs from hematology from late May 2024. Iron and CBC are within normal range. Vitamin B12 within normal range, but on lower end.  Less likely sleep apnea.  She is under a lot of stress, so depression and anxiety could be contributing.  Add thyroid studies today. UA today with trace leuks, positive nitrites. Culture ordered and pending.  Increase paroxetine to 40 mg daily for anxiety/depression and hot flashes. Add B12 200-500 mcg daily.   Recommend she switch taking Paxil in AM to PM as this could be causing her drowsiness.    Orders: -     TSH -     T4, free -     POCT Urinalysis Dipstick (Automated) -     Urine Culture  Depression with anxiety Assessment & Plan: Increasing paroxetine to 40 mg.  She has plenty of the 20 mg tablets at home, so she will double up to make 40 mg.  She will notify when she is running low. Offered therapy for which she kindly declines, she has participated in a local group through her insurance company. She will seek out support group for Alzheimer's.  She will update in about 4 weeks.   Hot flashes Assessment &  Plan: Uncontrolled.  Checking thyroid studies today. Increase paroxetine to 40 mg daily.  She will update in a few weeks.   Arthritis Assessment & Plan: Will hold off on switching to duloxetine per patient preference.  Consider duloxetine in the future if the increased dose of paroxetine is ineffective for hot flashes and anxiety/depression.         Doreene Nest, NP

## 2023-02-27 NOTE — Patient Instructions (Signed)
Stop by the lab prior to leaving today. I will notify you of your results once received.   We increased the dose of your paroxetine to 40 mg daily.  You may take 2 tablets of your 20 mg dose to equal 40.  Let me know when you run low.  Add vitamin B12 200 mcg daily.  Please update me in about 4 weeks regarding the hot flashes and fatigue.  It was a pleasure to see you today!

## 2023-03-01 DIAGNOSIS — Z961 Presence of intraocular lens: Secondary | ICD-10-CM | POA: Diagnosis not present

## 2023-03-01 LAB — URINE CULTURE

## 2023-03-02 ENCOUNTER — Other Ambulatory Visit: Payer: Self-pay | Admitting: Primary Care

## 2023-03-02 DIAGNOSIS — N3000 Acute cystitis without hematuria: Secondary | ICD-10-CM

## 2023-03-02 LAB — URINE CULTURE
MICRO NUMBER:: 15118996
SPECIMEN QUALITY:: ADEQUATE

## 2023-03-02 MED ORDER — NITROFURANTOIN MONOHYD MACRO 100 MG PO CAPS
100.0000 mg | ORAL_CAPSULE | Freq: Two times a day (BID) | ORAL | 0 refills | Status: AC
Start: 2023-03-02 — End: 2023-03-07

## 2023-03-27 ENCOUNTER — Other Ambulatory Visit: Payer: Self-pay | Admitting: Primary Care

## 2023-03-27 DIAGNOSIS — K219 Gastro-esophageal reflux disease without esophagitis: Secondary | ICD-10-CM

## 2023-04-04 ENCOUNTER — Ambulatory Visit (INDEPENDENT_AMBULATORY_CARE_PROVIDER_SITE_OTHER): Payer: Medicare Other | Admitting: Primary Care

## 2023-04-04 ENCOUNTER — Encounter: Payer: Self-pay | Admitting: Primary Care

## 2023-04-04 VITALS — BP 122/58 | HR 58 | Temp 97.2°F | Ht 63.0 in | Wt 151.0 lb

## 2023-04-04 DIAGNOSIS — K219 Gastro-esophageal reflux disease without esophagitis: Secondary | ICD-10-CM

## 2023-04-04 DIAGNOSIS — R5382 Chronic fatigue, unspecified: Secondary | ICD-10-CM

## 2023-04-04 DIAGNOSIS — N3000 Acute cystitis without hematuria: Secondary | ICD-10-CM | POA: Diagnosis not present

## 2023-04-04 DIAGNOSIS — Z0000 Encounter for general adult medical examination without abnormal findings: Secondary | ICD-10-CM | POA: Diagnosis not present

## 2023-04-04 DIAGNOSIS — R7303 Prediabetes: Secondary | ICD-10-CM

## 2023-04-04 DIAGNOSIS — R232 Flushing: Secondary | ICD-10-CM

## 2023-04-04 DIAGNOSIS — D472 Monoclonal gammopathy: Secondary | ICD-10-CM | POA: Diagnosis not present

## 2023-04-04 DIAGNOSIS — M2550 Pain in unspecified joint: Secondary | ICD-10-CM

## 2023-04-04 DIAGNOSIS — J3089 Other allergic rhinitis: Secondary | ICD-10-CM

## 2023-04-04 DIAGNOSIS — G2581 Restless legs syndrome: Secondary | ICD-10-CM | POA: Diagnosis not present

## 2023-04-04 DIAGNOSIS — E785 Hyperlipidemia, unspecified: Secondary | ICD-10-CM | POA: Diagnosis not present

## 2023-04-04 DIAGNOSIS — Z1211 Encounter for screening for malignant neoplasm of colon: Secondary | ICD-10-CM

## 2023-04-04 DIAGNOSIS — Z79890 Hormone replacement therapy (postmenopausal): Secondary | ICD-10-CM

## 2023-04-04 DIAGNOSIS — H8109 Meniere's disease, unspecified ear: Secondary | ICD-10-CM

## 2023-04-04 DIAGNOSIS — F418 Other specified anxiety disorders: Secondary | ICD-10-CM

## 2023-04-04 LAB — LIPID PANEL
Cholesterol: 162 mg/dL (ref 0–200)
HDL: 61.2 mg/dL (ref >39.00)
LDL Cholesterol: 80 mg/dL (ref 0–99)
NonHDL: 100.53
Total CHOL/HDL Ratio: 3
Triglycerides: 104 mg/dL (ref 0.0–149.0)
VLDL: 20.8 mg/dL (ref 0.0–40.0)

## 2023-04-04 LAB — POC URINALSYSI DIPSTICK (AUTOMATED)
Bilirubin, UA: NEGATIVE
Blood, UA: NEGATIVE
Glucose, UA: NEGATIVE
Ketones, UA: NEGATIVE
Leukocytes, UA: NEGATIVE
Nitrite, UA: NEGATIVE
Protein, UA: NEGATIVE
Spec Grav, UA: 1.01 (ref 1.010–1.025)
Urobilinogen, UA: 0.2 E.U./dL
pH, UA: 6 (ref 5.0–8.0)

## 2023-04-04 LAB — HEMOGLOBIN A1C: Hgb A1c MFr Bld: 5.9 % (ref 4.6–6.5)

## 2023-04-04 MED ORDER — FLUTICASONE PROPIONATE 50 MCG/ACT NA SUSP
1.0000 | Freq: Two times a day (BID) | NASAL | 0 refills | Status: DC | PRN
Start: 2023-04-04 — End: 2023-06-30

## 2023-04-04 MED ORDER — GABAPENTIN 600 MG PO TABS
600.0000 mg | ORAL_TABLET | Freq: Every day | ORAL | 3 refills | Status: DC
Start: 2023-04-04 — End: 2024-03-18

## 2023-04-04 MED ORDER — FAMOTIDINE 20 MG PO TABS
20.0000 mg | ORAL_TABLET | Freq: Every day | ORAL | 0 refills | Status: DC
Start: 2023-04-04 — End: 2023-06-30

## 2023-04-04 NOTE — Assessment & Plan Note (Signed)
Improved.  Continue gabapentin 600 mg HS. Changed gabapentin dose to 600 mg tablet.

## 2023-04-04 NOTE — Assessment & Plan Note (Signed)
Immunizations UTD. Mammogram and bone density scan up-to-date. Colonoscopy due, referral placed to GI  Discussed the importance of a healthy diet and regular exercise in order for weight loss, and to reduce the risk of further co-morbidity.  Exam stable. Labs pending.  Follow up in 1 year for repeat physical.

## 2023-04-04 NOTE — Assessment & Plan Note (Signed)
Improved with increased Paxil dose.  Continue paroxetine 40 mg daily. Continue to monitor.

## 2023-04-04 NOTE — Assessment & Plan Note (Signed)
Controlled.  Continue Estrace 2-3 times weekly.

## 2023-04-04 NOTE — Assessment & Plan Note (Signed)
Labs last visit with culture positive UTI. Symptoms improved with treatment.  Repeat UA and culture pending as symptoms have returned.  Await results.

## 2023-04-04 NOTE — Assessment & Plan Note (Addendum)
Controlled.  Continue hydrochlorothiazide 25 mg daily. CMP reviewed from May 2024

## 2023-04-04 NOTE — Assessment & Plan Note (Signed)
Slight improvement.  Continue Paxil 40 mg daily.  Continue to monitor.

## 2023-04-04 NOTE — Assessment & Plan Note (Signed)
Stable.  Reviewed hematology notes from May 2024.

## 2023-04-04 NOTE — Patient Instructions (Addendum)
Start taking famotidine (Pepcid) 20 mg daily for heartburn.  Continue omeprazole 20 mg daily for heartburn.  Stop by the lab prior to leaving today. I will notify you of your results once received.   We changed your gabapentin pill to the 600 mg dose.  Only take 1 tablet by mouth at bedtime.  You will either be contacted via phone regarding your referral to GI for the colonoscopy, or you may receive a letter on your MyChart portal from our referral team with instructions for scheduling an appointment. Please let us know if you have not been contacted by anyone within two weeks.  We will be in touch with the urine results in 2 days.  It was a pleasure to see you today!

## 2023-04-04 NOTE — Assessment & Plan Note (Signed)
Repeat lipid panel pending. Continue rosuvastatin 10 mg daily. 

## 2023-04-04 NOTE — Assessment & Plan Note (Signed)
Repeat A1C pending.  Discussed the importance of a healthy diet and regular exercise in order for weight loss, and to reduce the risk of further co-morbidity.  

## 2023-04-04 NOTE — Assessment & Plan Note (Addendum)
Stable. Following with rheumatology  Continue tizanidine 2 mg HS. Continue Celebrex 100 mg BID.

## 2023-04-04 NOTE — Assessment & Plan Note (Signed)
Deteriorated.  Continue omeprazole 20 mg daily. Add Pepcid 20 mg HS.  She will update.

## 2023-04-04 NOTE — Progress Notes (Signed)
Subjective:    Patient ID: April Nguyen, female    DOB: 1951-08-22, 72 y.o.   MRN: [id number]  HPI  April Nguyen is a very pleasant 72 y.o. female who presents today for complete physical and follow up of chronic conditions.  Immunizations: -Tetanus: Completed in 2018 -Shingles: Completed Zostavax -Pneumonia: Completed Prevnar 13 in 2021, Pneumovax 23 in 2020  Diet: Fair diet.  Exercise: No regular exercise.   Eye exam: Completes annually  Dental exam: Completes semi-annually    Mammogram: September 2023 Bone Density Scan: September 2023  Colonoscopy: Completed in 2014, due 2024.  BP Readings from Last 3 Encounters:  04/04/23 (!) 122/58  02/27/23 118/62  02/03/23 125/70       Review of Systems  Constitutional:  Positive for fatigue. Negative for unexpected weight change.  HENT:  Negative for rhinorrhea.   Respiratory:  Negative for cough and shortness of breath.   Cardiovascular:  Negative for chest pain.  Gastrointestinal:  Negative for constipation and diarrhea.  Genitourinary:  Negative for difficulty urinating.  Musculoskeletal:  Positive for arthralgias.  Skin:  Negative for rash.  Allergic/Immunologic: Negative for environmental allergies.  Neurological:  Negative for dizziness and headaches.  Psychiatric/Behavioral:  The patient is nervous/anxious.          Past Medical History:  Diagnosis Date   Allergy    Seasonal   Anxiety    Arthritis    neck, wrists,hands, toes, and feet   COVID-19 09/08/2021   Depression    Eustachian tube dysfunction    Family history of adverse reaction to anesthesia    brother - PONV and combative   Genital warts    In past   GERD (gastroesophageal reflux disease)    History of shingles 04/16/2015   March 2016    history of Tumor of parotid gland    Hyperlipidemia    Meniere's disease    slight hearing loss, car sickness and dizziness   Migraines    sinus/stress   Monoclonal gammopathy    Polyarthralgia     PONV (postoperative nausea and vomiting)    and headaches and combative   Pre-diabetes    RLS (restless legs syndrome)    Seizures (HCC)    Dx 16 yrs ago - hormonal - none at least 3 yrs   Seizures (HCC)    H/O due to hormone issues    Social History   Socioeconomic History   Marital status: Married    Spouse name: Not on file   Number of children: Not on file   Years of education: Not on file   Highest education level: Not on file  Occupational History   Not on file  Tobacco Use   Smoking status: Never   Smokeless tobacco: Never  Vaping Use   Vaping status: Never Used  Substance and Sexual Activity   Alcohol use: No    Alcohol/week: 0.0 standard drinks of alcohol   Drug use: No   Sexual activity: Yes    Partners: Male    Comment: Husband  Other Topics Concern   Not on file  Social History Narrative   Work at the [employer] at [employer].  Lives with husband with [place]. Children- daughter and son;no smoking or alcohol.      -------------------------------------------------------------------------------    Grandchildren- 6    Caffeine- 1 cup in the morning, soda occasionally, green tea hot/cold   Enjoys reading, spending time at beach, spending time with family.   Social  Determinants of Health   Financial Resource Strain: Low Risk  (02/23/2023)   Overall Financial Resource Strain (CARDIA)    Difficulty of Paying Living Expenses: Not hard at all  Food Insecurity: No Food Insecurity (02/23/2023)   Hunger Vital Sign    Worried About Running Out of Food in the Last Year: Never true    Ran Out of Food in the Last Year: Never true  Transportation Needs: No Transportation Needs (02/23/2023)   PRAPARE - Administrator, Civil Service (Medical): No    Lack of Transportation (Non-Medical): No  Physical Activity: Insufficiently Active (02/23/2023)   Exercise Vital Sign    Days of Exercise per Week: 3 days    Minutes of Exercise per Session: 20  min  Stress: Stress Concern Present (02/23/2023)   [employer] of Occupational Health - Occupational Stress Questionnaire    Feeling of Stress : To some extent  Social Connections: Moderately Integrated (02/23/2023)   Social Connection and Isolation Panel [NHANES]    Frequency of Communication with Friends and Family: More than three times a week    Frequency of Social Gatherings with Friends and Family: More than three times a week    Attends Religious Services: More than 4 times per year    Active Member of [employer] or Organizations: Patient declined    Attends Banker Meetings: Never    Marital Status: Married  Recent Concern: Social Connections - Moderately Isolated (02/08/2023)   Social Connection and Isolation Panel [NHANES]    Frequency of Communication with Friends and Family: More than three times a week    Frequency of Social Gatherings with Friends and Family: More than three times a week    Attends Religious Services: Never    Database administrator or Organizations: No    Attends Banker Meetings: Never    Marital Status: Married  Catering manager Violence: Not At Risk (02/08/2023)   Humiliation, Afraid, Rape, and Kick questionnaire    Fear of Current or Ex-Partner: No    Emotionally Abused: No    Physically Abused: No    Sexually Abused: No    Past Surgical History:  Procedure Laterality Date   ABDOMINAL HYSTERECTOMY     CATARACT EXTRACTION Bilateral 01/2023   CHOLECYSTECTOMY  09/05/2006   dialation and cartarage  1982, 1999, 2000   DISTAL INTERPHALANGEAL JOINT FUSION Left 05/24/2022   Procedure: Left index and ring DIP fusion;  Surgeon: Kennedy Bucker, MD;  Location: [facility];  Service: Orthopedics;  Laterality: Left;   FINGER SURGERY  2011, 2012   Bone fusion of middle finger right and left hands   FINGER SURGERY  09/06/2011   Pin removed from left middle finger   GANGLION CYST EXCISION  2011, 2012   Left and right hands    MYRINGOTOMY WITH TUBE PLACEMENT Bilateral 05/13/2015   Procedure: MYRINGOTOMY WITH  BUTTERFLY TUBE PLACEMENT;  Surgeon: Bud Face, MD;  Location: [facility];  Service: ENT;  Laterality: Bilateral;   MYRINGOTOMY WITH TUBE PLACEMENT Bilateral 08/28/2019   Procedure: MYRINGOTOMY WITH BUTTERFLY TUBE PLACEMENT;  Surgeon: Bud Face, MD;  Location: [facility];  Service: ENT;  Laterality: Bilateral;   PAROTID GLAND TUMOR EXCISION  09/05/1984   TONSILLECTOMY AND ADENOIDECTOMY  09/05/1957   TUBAL LIGATION  09/05/2008    Family History  Problem Relation Age of Onset   Arthritis Mother    Hyperlipidemia Mother    Hypertension Mother    Stroke Mother  Heart disease Mother    Diabetes Mother    Hyperlipidemia Father    Heart disease Father    Arthritis Brother    Throat cancer Maternal Grandfather    Breast cancer Neg Hx     Allergies  Allergen Reactions   Septra [Sulfamethoxazole-Trimethoprim] Rash    Current Outpatient Medications on File Prior to Visit  Medication Sig Dispense Refill   Ascorbic Acid (VITAMIN C) 1000 MG tablet Take 1,800 mg by mouth daily.     celecoxib (CELEBREX) 100 MG capsule Take 100 mg by mouth 2 (two) times daily. 100 mg AM, 200 mg at dinner     cetirizine (ZYRTEC) 10 MG tablet Take 1 tablet (10 mg total) by mouth daily. (Patient taking differently: Take 10 mg by mouth daily. am) 30 tablet 11   Ergocalciferol (VITAMIN D2) 10 MCG (400 UNIT) TABS Take 800 Units by mouth daily.     estradiol (ESTRACE) 0.1 MG/GM vaginal cream APPLY TWO TO THREE TIMES WEEKLY 42.5 g 0   hydrochlorothiazide (HYDRODIURIL) 25 MG tablet Take 1 tablet (25 mg total) by mouth daily. for blood pressure. (Patient taking differently: Take 25 mg by mouth daily.) 90 tablet 3   hydrOXYzine (ATARAX/VISTARIL) 10 MG tablet Take 1-2 tablets by mouth twice daily as needed for long car rides. 30 tablet 0   meclizine (ANTIVERT) 25 MG tablet Take 1 tablet (25 mg total) by  mouth 3 (three) times daily as needed for dizziness (take as needed not with other antihistimines). 30 tablet 0   omeprazole (PRILOSEC) 20 MG capsule Take 1 capsule (20 mg total) by mouth daily. For heartburn 90 capsule 3   PARoxetine (PAXIL) 20 MG tablet TAKE ONE TABLET BY MOUTH ONCE DAILY FOR ANXIETY 90 tablet 0   rosuvastatin (CRESTOR) 10 MG tablet TAKE ONE TABLET BY MOUTH ONCE A DAY FOR CHOLESTEROL 90 tablet 0   tiZANidine (ZANAFLEX) 2 MG tablet Take by mouth.     traMADol (ULTRAM) 50 MG tablet Take 1 tablet (50 mg total) by mouth every 8 (eight) hours as needed. 30 tablet 0   No current facility-administered medications on file prior to visit.    BP (!) 122/58   Pulse (!) 58   Temp (!) 97.2 F (36.2 C) (Temporal)   Ht 5\' 3"  (1.6 m)   Wt 151 lb (68.5 kg)   SpO2 98%   BMI 26.75 kg/m  Objective:   Physical Exam HENT:     Right Ear: Tympanic membrane and ear canal normal.     Left Ear: Tympanic membrane and ear canal normal.     Nose: Nose normal.  Eyes:     Conjunctiva/sclera: Conjunctivae normal.     Pupils: Pupils are equal, round, and reactive to light.  Neck:     Thyroid: No thyromegaly.  Cardiovascular:     Rate and Rhythm: Normal rate and regular rhythm.     Heart sounds: No murmur heard. Pulmonary:     Effort: Pulmonary effort is normal.     Breath sounds: Normal breath sounds. No rales.  Abdominal:     General: Bowel sounds are normal.     Palpations: Abdomen is soft.     Tenderness: There is no abdominal tenderness.  Musculoskeletal:        General: Normal range of motion.     Cervical back: Neck supple.  Lymphadenopathy:     Cervical: No cervical adenopathy.  Skin:    General: Skin is warm and dry.  Findings: No rash.  Neurological:     Mental Status: She is alert and oriented to person, place, and time.     Cranial Nerves: No cranial nerve deficit.     Deep Tendon Reflexes: Reflexes are normal and symmetric.           Assessment & Plan:   Preventative health care Assessment & Plan: Immunizations UTD. Mammogram and bone density scan up-to-date. Colonoscopy due, referral placed to GI  Discussed the importance of a healthy diet and regular exercise in order for weight loss, and to reduce the risk of further co-morbidity.  Exam stable. Labs pending.  Follow up in 1 year for repeat physical.    Chronic fatigue Assessment & Plan: Labs last visit with culture positive UTI. Symptoms improved with treatment.  Repeat UA and culture pending as symptoms have returned.  Await results.   Orders: -     Urine Culture -     POCT Urinalysis Dipstick (Automated)  Hot flashes Assessment & Plan: Improved with increased Paxil dose.  Continue paroxetine 40 mg daily. Continue to monitor.    Gastroesophageal reflux disease without esophagitis Assessment & Plan: Deteriorated.  Continue omeprazole 20 mg daily. Add Pepcid 20 mg HS.  She will update.  Orders: -     Famotidine; Take 1 tablet (20 mg total) by mouth daily. for heartburn.  Dispense: 90 tablet; Refill: 0  Depression with anxiety Assessment & Plan: Slight improvement.  Continue Paxil 40 mg daily.  Continue to monitor.    Hyperlipidemia, unspecified hyperlipidemia type Assessment & Plan: Repeat lipid panel pending.  Continue rosuvastatin 10 mg daily.   Orders: -     Lipid panel  Meniere's disease, unspecified laterality Assessment & Plan: Controlled.  Continue hydrochlorothiazide 25 mg daily. CMP reviewed from May 2024   Restless legs Assessment & Plan: Improved.  Continue gabapentin 600 mg HS. Changed gabapentin dose to 600 mg tablet.  Orders: -     Gabapentin; Take 1 tablet (600 mg total) by mouth at bedtime. For restless legs  Dispense: 90 tablet; Refill: 3  Prediabetes Assessment & Plan: Repeat A1C pending.  Discussed the importance of a healthy diet and regular exercise in order for weight loss, and to reduce the risk of  further co-morbidity.   Orders: -     Hemoglobin A1c  Polyarthralgia Assessment & Plan: Stable. Following with rheumatology  Continue tizanidine 2 mg HS. Continue Celebrex 100 mg BID.   Monoclonal gammopathy Assessment & Plan: Stable.  Reviewed hematology notes from May 2024.    Hormone replacement therapy (HRT) Assessment & Plan: Controlled.  Continue Estrace 2-3 times weekly.    Screening for colon cancer -     Ambulatory referral to Gastroenterology  Environmental and seasonal allergies -     Fluticasone Propionate; Place 1 spray into both nostrils 2 (two) times daily as needed for allergies or rhinitis.  Dispense: 48 g; Refill: 0        Doreene Nest, NP

## 2023-04-06 ENCOUNTER — Other Ambulatory Visit: Payer: Self-pay | Admitting: Primary Care

## 2023-04-06 DIAGNOSIS — G2581 Restless legs syndrome: Secondary | ICD-10-CM

## 2023-04-06 DIAGNOSIS — M1990 Unspecified osteoarthritis, unspecified site: Secondary | ICD-10-CM

## 2023-04-07 ENCOUNTER — Other Ambulatory Visit: Payer: Self-pay | Admitting: Primary Care

## 2023-04-07 DIAGNOSIS — N3000 Acute cystitis without hematuria: Secondary | ICD-10-CM

## 2023-04-07 MED ORDER — NITROFURANTOIN MONOHYD MACRO 100 MG PO CAPS
100.0000 mg | ORAL_CAPSULE | Freq: Two times a day (BID) | ORAL | 0 refills | Status: AC
Start: 2023-04-07 — End: 2023-04-14

## 2023-04-10 ENCOUNTER — Other Ambulatory Visit: Payer: Self-pay | Admitting: Primary Care

## 2023-04-10 ENCOUNTER — Encounter: Payer: Self-pay | Admitting: *Deleted

## 2023-04-10 DIAGNOSIS — F418 Other specified anxiety disorders: Secondary | ICD-10-CM

## 2023-04-11 DIAGNOSIS — F418 Other specified anxiety disorders: Secondary | ICD-10-CM

## 2023-04-12 MED ORDER — PAROXETINE HCL 40 MG PO TABS: 40.0000 mg | ORAL_TABLET | ORAL | 3 refills | Status: DC

## 2023-04-22 ENCOUNTER — Other Ambulatory Visit: Payer: Self-pay | Admitting: Primary Care

## 2023-04-22 DIAGNOSIS — N951 Menopausal and female climacteric states: Secondary | ICD-10-CM

## 2023-05-19 ENCOUNTER — Other Ambulatory Visit: Payer: Self-pay | Admitting: Primary Care

## 2023-05-19 DIAGNOSIS — E785 Hyperlipidemia, unspecified: Secondary | ICD-10-CM

## 2023-06-01 DIAGNOSIS — M47812 Spondylosis without myelopathy or radiculopathy, cervical region: Secondary | ICD-10-CM | POA: Diagnosis not present

## 2023-06-01 DIAGNOSIS — M159 Polyosteoarthritis, unspecified: Secondary | ICD-10-CM | POA: Diagnosis not present

## 2023-06-01 DIAGNOSIS — Z636 Dependent relative needing care at home: Secondary | ICD-10-CM | POA: Diagnosis not present

## 2023-06-01 DIAGNOSIS — M7062 Trochanteric bursitis, left hip: Secondary | ICD-10-CM | POA: Diagnosis not present

## 2023-06-01 DIAGNOSIS — D472 Monoclonal gammopathy: Secondary | ICD-10-CM | POA: Diagnosis not present

## 2023-06-06 ENCOUNTER — Other Ambulatory Visit: Payer: Self-pay | Admitting: Physical Medicine & Rehabilitation

## 2023-06-06 DIAGNOSIS — M542 Cervicalgia: Secondary | ICD-10-CM

## 2023-06-06 DIAGNOSIS — M5442 Lumbago with sciatica, left side: Secondary | ICD-10-CM | POA: Diagnosis not present

## 2023-06-06 DIAGNOSIS — M5412 Radiculopathy, cervical region: Secondary | ICD-10-CM | POA: Diagnosis not present

## 2023-06-06 DIAGNOSIS — G8929 Other chronic pain: Secondary | ICD-10-CM

## 2023-06-21 ENCOUNTER — Other Ambulatory Visit: Payer: Self-pay | Admitting: Primary Care

## 2023-06-21 DIAGNOSIS — K219 Gastro-esophageal reflux disease without esophagitis: Secondary | ICD-10-CM

## 2023-06-24 ENCOUNTER — Ambulatory Visit
Admission: RE | Admit: 2023-06-24 | Discharge: 2023-06-24 | Disposition: A | Discharge status: Home / Self Care | Payer: Medicare Other | Source: Ambulatory Visit | Attending: Physical Medicine & Rehabilitation | Admitting: Physical Medicine & Rehabilitation

## 2023-06-24 DIAGNOSIS — M542 Cervicalgia: Secondary | ICD-10-CM

## 2023-06-24 DIAGNOSIS — M4802 Spinal stenosis, cervical region: Secondary | ICD-10-CM | POA: Diagnosis not present

## 2023-06-24 DIAGNOSIS — E041 Nontoxic single thyroid nodule: Secondary | ICD-10-CM | POA: Diagnosis not present

## 2023-06-24 DIAGNOSIS — M5442 Lumbago with sciatica, left side: Secondary | ICD-10-CM | POA: Diagnosis not present

## 2023-06-24 DIAGNOSIS — G8929 Other chronic pain: Secondary | ICD-10-CM

## 2023-06-30 ENCOUNTER — Other Ambulatory Visit: Payer: Self-pay | Admitting: Primary Care

## 2023-06-30 DIAGNOSIS — J3089 Other allergic rhinitis: Secondary | ICD-10-CM

## 2023-06-30 DIAGNOSIS — K219 Gastro-esophageal reflux disease without esophagitis: Secondary | ICD-10-CM

## 2023-07-12 ENCOUNTER — Other Ambulatory Visit: Payer: Self-pay | Admitting: Physical Medicine & Rehabilitation

## 2023-07-12 DIAGNOSIS — E041 Nontoxic single thyroid nodule: Secondary | ICD-10-CM | POA: Diagnosis not present

## 2023-07-12 DIAGNOSIS — M5442 Lumbago with sciatica, left side: Secondary | ICD-10-CM | POA: Diagnosis not present

## 2023-07-12 DIAGNOSIS — G8929 Other chronic pain: Secondary | ICD-10-CM | POA: Diagnosis not present

## 2023-07-12 DIAGNOSIS — M5412 Radiculopathy, cervical region: Secondary | ICD-10-CM | POA: Diagnosis not present

## 2023-07-12 DIAGNOSIS — M5416 Radiculopathy, lumbar region: Secondary | ICD-10-CM | POA: Diagnosis not present

## 2023-07-12 DIAGNOSIS — M542 Cervicalgia: Secondary | ICD-10-CM | POA: Diagnosis not present

## 2023-07-13 ENCOUNTER — Ambulatory Visit
Admission: RE | Admit: 2023-07-13 | Discharge: 2023-07-13 | Disposition: A | Discharge status: Home / Self Care | Payer: Medicare Other | Source: Ambulatory Visit | Attending: Physical Medicine & Rehabilitation | Admitting: Physical Medicine & Rehabilitation

## 2023-07-13 DIAGNOSIS — E041 Nontoxic single thyroid nodule: Secondary | ICD-10-CM

## 2023-07-17 DIAGNOSIS — M5416 Radiculopathy, lumbar region: Secondary | ICD-10-CM | POA: Diagnosis not present

## 2023-07-18 ENCOUNTER — Other Ambulatory Visit: Payer: Self-pay | Admitting: Primary Care

## 2023-07-18 DIAGNOSIS — N951 Menopausal and female climacteric states: Secondary | ICD-10-CM

## 2023-07-21 NOTE — Progress Notes (Unsigned)
Referring Physician:  Elijah Birk, MD [address] [place],  [place] [address]  Primary Physician:  Doreene Nest, NP  History of Present Illness: 07/25/2023 April Nguyen is here today with a chief complaint of back and leg pain.  Her pain goes into her left hip and into her inner thigh on the left side.  She also gets some pain down in the back of her leg to below her knee.  She has been having pain for approximately 6 months.  Getting up from sitting, walking, bending make her pain worse.  Nothing really helps.  Over the past 2 months, she has developed issues with urge incontinence.  This is occurred both with urination and with defecation.  She has never had issues like this before.  She did have a prior reconstructive surgery multiple years ago.  Bowel/Bladder Dysfunction: none   Conservative measures:  Physical therapy: Has not participated in PT  Multimodal medical therapy including regular antiinflammatories: Celebrex, Gabapentin, Tizanidine  Injections: 07/17/2023 Bilateral L5-S1 transforaminal epidural injections   Past Surgery: none  April Nguyen has no symptoms of cervical myelopathy.  The symptoms are causing a significant impact on the patient's life.   I have utilized the care everywhere function in epic to review the outside records available from external health systems.  Review of Systems:  A 10 point review of systems is negative, except for the pertinent positives and negatives detailed in the HPI.  Past Medical History: Past Medical History:  Diagnosis Date   Allergy    Seasonal   Anxiety    Arthritis    neck, wrists,hands, toes, and feet   COVID-19 09/08/2021   Depression    Eustachian tube dysfunction    Family history of adverse reaction to anesthesia    brother - PONV and combative   Genital warts    In past   GERD (gastroesophageal reflux disease)    History of shingles 04/16/2015   March 2016    history of Tumor  of parotid gland    Hyperlipidemia    Meniere's disease    slight hearing loss, car sickness and dizziness   Migraines    sinus/stress   Monoclonal gammopathy    Polyarthralgia    PONV (postoperative nausea and vomiting)    and headaches and combative   Pre-diabetes    RLS (restless legs syndrome)    Seizures (HCC)    Dx 16 yrs ago - hormonal - none at least 3 yrs   Seizures (HCC)    H/O due to hormone issues    Past Surgical History: Past Surgical History:  Procedure Laterality Date   ABDOMINAL HYSTERECTOMY     CATARACT EXTRACTION Bilateral 01/2023   CHOLECYSTECTOMY  09/05/2006   dialation and cartarage  1982, 1999, 2000   DISTAL INTERPHALANGEAL JOINT FUSION Left 05/24/2022   Procedure: Left index and ring DIP fusion;  Surgeon: Kennedy Bucker, MD;  Location: [facility];  Service: Orthopedics;  Laterality: Left;   FINGER SURGERY  2011, 2012   Bone fusion of middle finger right and left hands   FINGER SURGERY  09/06/2011   Pin removed from left middle finger   GANGLION CYST EXCISION  2011, 2012   Left and right hands   MYRINGOTOMY WITH TUBE PLACEMENT Bilateral 05/13/2015   Procedure: MYRINGOTOMY WITH  BUTTERFLY TUBE PLACEMENT;  Surgeon: Bud Face, MD;  Location: [facility];  Service: ENT;  Laterality: Bilateral;   MYRINGOTOMY WITH TUBE  PLACEMENT Bilateral 08/28/2019   Procedure: MYRINGOTOMY WITH BUTTERFLY TUBE PLACEMENT;  Surgeon: Bud Face, MD;  Location: [facility];  Service: ENT;  Laterality: Bilateral;   PAROTID GLAND TUMOR EXCISION  09/05/1984   TONSILLECTOMY AND ADENOIDECTOMY  09/05/1957   TUBAL LIGATION  09/05/2008    Allergies: Allergies as of 07/25/2023 - Review Complete 07/25/2023  Allergen Reaction Noted   Septra [sulfamethoxazole-trimethoprim] Rash 04/09/2015    Medications:  Current Outpatient Medications:    Ascorbic Acid (VITAMIN C) 1000 MG tablet, Take 1,800 mg by mouth daily., Disp: , Rfl:    celecoxib  (CELEBREX) 100 MG capsule, Take 100 mg by mouth 2 (two) times daily. 100 mg AM, 200 mg at dinner, Disp: , Rfl:    cetirizine (ZYRTEC) 10 MG tablet, Take 1 tablet (10 mg total) by mouth daily. (Patient taking differently: Take 10 mg by mouth daily. am), Disp: 30 tablet, Rfl: 11   Ergocalciferol (VITAMIN D2) 10 MCG (400 UNIT) TABS, Take 800 Units by mouth daily., Disp: , Rfl:    estradiol (ESTRACE) 0.1 MG/GM vaginal cream, APPLY TWO TO THREE TIMES WEEKLY, Disp: 42.5 g, Rfl: 1   famotidine (PEPCID) 20 MG tablet, TAKE ONE TABLET (20 MG TOTAL) BY MOUTH DAILY. FOR HEARTBURN., Disp: 90 tablet, Rfl: 1   fluticasone (FLONASE) 50 MCG/ACT nasal spray, PLACE ONE SPRAY INTO BOTH NOSTRILS TWO (TWO) TIMES DAILY AS NEEDED FOR ALLERGIES OR RHINITIS., Disp: 48 mL, Rfl: 0   gabapentin (NEURONTIN) 600 MG tablet, Take 1 tablet (600 mg total) by mouth at bedtime. For restless legs, Disp: 90 tablet, Rfl: 3   omeprazole (PRILOSEC) 20 MG capsule, TAKE ONE CAPSULE BY MOUTH ONCE DAILY FOR HEARTBURN, Disp: 90 capsule, Rfl: 2   PARoxetine (PAXIL) 40 MG tablet, Take 1 tablet (40 mg total) by mouth every morning. for anxiety and depression., Disp: 90 tablet, Rfl: 3   rosuvastatin (CRESTOR) 10 MG tablet, TAKE ONE TABLET BY MOUTH ONCE A DAY FOR CHOLESTEROL, Disp: 90 tablet, Rfl: 2   tiZANidine (ZANAFLEX) 2 MG tablet, Take 2 mg by mouth 2 (two) times daily., Disp: , Rfl:    hydrOXYzine (ATARAX/VISTARIL) 10 MG tablet, Take 1-2 tablets by mouth twice daily as needed for long car rides. (Patient not taking: Reported on 07/25/2023), Disp: 30 tablet, Rfl: 0   meclizine (ANTIVERT) 25 MG tablet, Take 1 tablet (25 mg total) by mouth 3 (three) times daily as needed for dizziness (take as needed not with other antihistimines). (Patient not taking: Reported on 07/25/2023), Disp: 30 tablet, Rfl: 0  Social History: Social History   Tobacco Use   Smoking status: Never   Smokeless tobacco: Never  Vaping Use   Vaping status: Never Used   Substance Use Topics   Alcohol use: No    Alcohol/week: 0.0 standard drinks of alcohol   Drug use: No    Family Medical History: Family History  Problem Relation Age of Onset   Arthritis Mother    Hyperlipidemia Mother    Hypertension Mother    Stroke Mother    Heart disease Mother    Diabetes Mother    Hyperlipidemia Father    Heart disease Father    Arthritis Brother    Throat cancer Maternal Grandfather    Breast cancer Neg Hx     Physical Examination: Vitals:   07/25/23 1102  BP: 128/72    General: Patient is in no apparent distress. Attention to examination is appropriate.  Neck:   Supple.  Full range of motion.  Respiratory: Patient is breathing without any difficulty.   NEUROLOGICAL:     Awake, alert, oriented to person, place, and time.  Speech is clear and fluent.   Cranial Nerves: Pupils equal round and reactive to light.  Facial tone is symmetric.  Facial sensation is symmetric. Shoulder shrug is symmetric. Tongue protrusion is midline.  There is no pronator drift.  Strength: Side Biceps Triceps Deltoid Interossei Grip Wrist Ext. Wrist Flex.  R 5 5 5 5 5 5 5   L 5 5 5 5 5 5 5    Side Iliopsoas Quads Hamstring PF DF EHL  R 5 5 5 5 5 5   L 5 5 5 5 5 5    Reflexes are 1+ and symmetric at the biceps, triceps, brachioradialis, patella and achilles.   Hoffman's is absent.   Bilateral upper and lower extremity sensation is intact to light touch.    No evidence of dysmetria noted.  Gait is antalgic.     Medical Decision Making  Imaging: MRI CL spine 06/24/2023 Disc levels:   C1-2: Left-sided joint effusion.   C2-3: Facet degenerative changes. No significant spinal canal or neural foraminal stenosis.   C3-4: Mild disc bulge without significant spinal canal stenosis. Uncovertebral and facet degenerative change resulting in mild bilateral neural foraminal narrowing.   C4-5: Small posterior disc protrusion without significant spinal canal  stenosis. Uncovertebral and facet degenerative changes resulting in moderate bilateral neural foraminal narrowing.   C5-6: Small posterior disc osteophyte complex without significant spinal canal stenosis. Uncovertebral and prominent hypertrophic facet degenerative changes resulting in mild right neural foraminal narrowing.   C6-7: Posterior disc osteophyte complex without significant spinal canal stenosis. Uncovertebral and facet degenerative change resulting in mild right and moderate to severe left neural foraminal narrowing.   C7-T1: Facet degenerative changes resulting in mild-to-moderate left neural foraminal narrowing. No spinal canal stenosis.   MRI LUMBAR SPINE FINDINGS   Segmentation:  Standard segmentation is assumed.   Alignment:  Dextroconvex curvature.   Vertebrae: No acute fracture, evidence of discitis, or bone lesion. Chronic compression fractures of the L3 endplates with associated Schmorl nodes. Endplate degenerative changes throughout the lumbar spine, more pronounced at L4-5.   Conus medullaris and cauda equina: Conus extends to the L1 level. Conus and cauda equina appear normal.   Paraspinal and other soft tissues: Negative.   Disc levels:   T12-L1: No spinal canal or neural foraminal stenosis.   L1-2: Disc bulge with superiorly migrating left central disc extrusion and mild facet degenerative changes. Mild spinal canal stenosis with mild narrowing of the left subarticular zone and mild bilateral neural foraminal narrowing   L2-3: Loss of disc height, left asymmetric disc bulge, moderate facet degenerative changes and ligamentum flavum redundancy resulting in moderate spinal canal stenosis with moderate right and severe left subarticular zone stenosis and moderate bilateral neural foraminal narrowing.   L3-4: Loss of disc height, disc bulge, moderate facet degenerative changes and ligamentum flavum redundancy resulting mild spinal canal stenosis  with moderate narrowing of the bilateral subarticular zones, right greater than left, moderate right and mild left neural foraminal narrowing.   L4-5: Loss of disc height, disc bulge, moderate hypertrophic facet degenerative changes and ligamentum flavum redundancy resulting in mild-to-moderate spinal canal stenosis with moderate narrowing of the bilateral subarticular zones and moderate bilateral neural foraminal narrowing.   L5-S1: Shallow disc bulge and moderate facet degenerative changes without significant spinal canal or neural foraminal stenosis.   IMPRESSION: 1. Degenerative changes of the cervical spine without  high-grade spinal canal stenosis at any level. 2. Moderate bilateral neural foraminal narrowing at C4-5. 3. Mild right and moderate to severe left neural foraminal narrowing at C6-7. 4. Mild-to-moderate left neural foraminal narrowing at C7-T1. 5. Moderate spinal canal stenosis with moderate right and severe left subarticular zone stenosis and moderate bilateral neural foraminal narrowing at L2-3. 6. Mild spinal canal stenosis with moderate narrowing of the bilateral subarticular zones, moderate right and mild left neural foraminal narrowing at L3-4. 7. Mild-to-moderate spinal canal stenosis with moderate narrowing of the bilateral subarticular zones and moderate bilateral neural foraminal narrowing at L4-5. 8. A 3 cm right thyroid lobe nodule. Recommend thyroid ultrasound for further evaluation.     Electronically Signed   By: Baldemar Lenis M.D.   On: 07/11/2023 10:15  I have personally reviewed the images and agree with the above interpretation.  Assessment and Plan: Ms. Vitullo is a pleasant 72 y.o. female with lumbar stenosis causing neurogenic claudication.  This is worst at L2-3.  I do not think this is the cause of her incontinence, as this is consistent with spondylotic change over time.  She has had incontinence for over 2 months.  At  this point, early surgical intervention would not change the time course of her incontinence.  Additionally, I am not convinced that her lumbar stenosis is causing her incontinence currently.  As such, I recommended that she start with physical therapy.  If she does not improve with physical therapy, we will discuss L2-3 and L4-5 decompression.  She is currently scheduled to see a gastroenterologist in January.  I will refer her back to her urogynecologist for reevaluation.  I would like to see her back in 8 weeks.  I spent a total of 30 minutes in this patient's care today. This time was spent reviewing pertinent records including imaging studies, obtaining and confirming history, performing a directed evaluation, formulating and discussing my recommendations, and documenting the visit within the medical record.      Thank you for involving me in the care of this patient.      Westen Dinino K. Myer Haff MD, [facility] Neurosurgery

## 2023-07-25 ENCOUNTER — Ambulatory Visit: Payer: Medicare Other | Admitting: Neurosurgery

## 2023-07-25 ENCOUNTER — Encounter: Payer: Self-pay | Admitting: Neurosurgery

## 2023-07-25 VITALS — BP 128/72 | Ht 64.0 in | Wt 148.0 lb

## 2023-07-25 DIAGNOSIS — M48062 Spinal stenosis, lumbar region with neurogenic claudication: Secondary | ICD-10-CM

## 2023-07-25 DIAGNOSIS — N814 Uterovaginal prolapse, unspecified: Secondary | ICD-10-CM | POA: Diagnosis not present

## 2023-08-01 DIAGNOSIS — E041 Nontoxic single thyroid nodule: Secondary | ICD-10-CM | POA: Diagnosis not present

## 2023-08-01 DIAGNOSIS — G8929 Other chronic pain: Secondary | ICD-10-CM | POA: Diagnosis not present

## 2023-08-01 DIAGNOSIS — M5416 Radiculopathy, lumbar region: Secondary | ICD-10-CM | POA: Diagnosis not present

## 2023-08-01 DIAGNOSIS — M5442 Lumbago with sciatica, left side: Secondary | ICD-10-CM | POA: Diagnosis not present

## 2023-08-21 DIAGNOSIS — M5416 Radiculopathy, lumbar region: Secondary | ICD-10-CM | POA: Diagnosis not present

## 2023-09-05 DIAGNOSIS — M5416 Radiculopathy, lumbar region: Secondary | ICD-10-CM | POA: Diagnosis not present

## 2023-09-05 DIAGNOSIS — E041 Nontoxic single thyroid nodule: Secondary | ICD-10-CM | POA: Diagnosis not present

## 2023-09-05 DIAGNOSIS — G8929 Other chronic pain: Secondary | ICD-10-CM | POA: Diagnosis not present

## 2023-09-05 DIAGNOSIS — M5442 Lumbago with sciatica, left side: Secondary | ICD-10-CM | POA: Diagnosis not present

## 2023-09-05 NOTE — Progress Notes (Signed)
 Chief complaint Thyroid  nodule  History of present illness April Nguyen is a 72 y.o. female seen in consultation for a thyroid  nodule at the request of Delon Gins, MD.  Patient under MRI for cervicalgia in 06/2023 and was found to have an incidental 3 cm right thyroid  nodule. She later had a thyroid  US  completed on 07/13/2023 which noted a dominant right-upper lobe ill-defined solid 2.9 x 2.2 x 1.4 cm nodule that was recommended for FNA. She is here today to discuss these results. She has no prior history of thyroid  disease and her TSH from earlier this year was in normal range on no therapy. She has a daughter with h/o hyperthyroidism and thyroid  nodules (later treated with RAI and is hypothyroid now). No known family history of thyroid  cancer. She does have a history of radiation exposure to her neck as adjunct therapy for parotid gland removal during her childhood. She has no dysphagia, odynophagia, anterior neck pain or swelling. She denies hoarsenses or change in voice. Overall she feels well and denies any acute complaints or concerns.   Past Medical History:  Diagnosis Date  . Rectocele     Past Surgical History:  Procedure Laterality Date  . EXCISION PAROTID GLAND/TUMOR  1986  . DILATION AND CURETTAGE, DIAGNOSTIC / THERAPEUTIC  1998  . HYSTERECTOMY  2001  . COLONOSCOPY  04/08/2004   Normal Colon: CBF 04/2014  . CHOLECYSTECTOMY  2008  . pelvic reconstruction  2017  . DIP Fusion Left 05/24/2022   Index and Ring Finger-Menz  . CATARACT EXTRACTION Bilateral 2024  . DILATION AND CURETTAGE OF UTERUS      Outpatient Medications Marked as Taking for the 09/05/23 encounter (Initial consult) with Brendia Calton Squires, PA  Medication Sig Dispense Refill  . ascorbic acid (VITAMIN C ORAL) Take by mouth    . celecoxib  (CELEBREX ) 100 MG capsule TAKE UP TO TWO CAPSULES BY MOUTH TWO TIMES DAILY AS NEEDED FOR PAIN 360 capsule 1  . cetirizine  (ZYRTEC ) 10 MG tablet Take 10  mg by mouth once daily    . ergocalciferol , vitamin D2, 10 mcg (400 unit) Tab Take 1 tablet by mouth daily with breakfast    . estradioL  (ESTRACE ) 0.01 % (0.1 mg/gram) vaginal cream Place 2 g vaginally once daily    . fluticasone  propionate (FLONASE ) 50 mcg/actuation nasal spray Place 2 sprays into both nostrils once daily    . gabapentin  600 mg Tb24 Take 600 mg by mouth at bedtime    . omeprazole  (PRILOSEC) 40 MG DR capsule Take 40 mg by mouth once daily.    . PARoxetine  (PAXIL ) 40 MG tablet Take 40 mg by mouth once daily    . rosuvastatin  (CRESTOR ) 10 MG tablet     . tiZANidine (ZANAFLEX) 2 MG tablet TAKE ONE TABLET (2 MG TOTAL) BY MOUTH TWO TIMES DAILY AS NEEDED 60 tablet 5    Social History   Tobacco Use  . Smoking status: Never  . Smokeless tobacco: Never  Vaping Use  . Vaping status: Never Used  Substance Use Topics  . Alcohol use: No    Alcohol/week: 0.0 standard drinks of alcohol  . Drug use: No    Family History  Problem Relation Name Age of Onset  . Arthritis Mother    . Stroke Mother    . Heart disease Mother    . Diabetes Mother    . Myocardial Infarction (Heart attack) Mother    . Heart disease Father    . Myocardial Infarction (  Heart attack) Father      Review of systems GENERAL:  Patient denies fevers.  Patient denies fatigue. Patient denies weight gain. NEUROLOGIC:  Patient denies headaches. EYES:  Patient denies blurred vision. CARDIAC:  Patient denies chest pain or palpitations. RESPIRATORY:  Patient denies SOB.  Patient denies a cough.   GASTROINTESTINAL:  Patient denies abdominal pain, nausea and vomiting and constipation.  GENITOURINARY:  Patient denies dysuria and hematuria.  EXTREMITIES:  Patient denies leg swelling. HEME:  Patient denies easy bruisability. SKIN:  Patient denies any rash.   Physical Exam BP 122/72   Pulse 106   Ht 160 cm (5' 2.99)   Wt 68.9 kg (152 lb)   SpO2 96%   BMI 26.93 kg/m   GEN: well developed, well nourished, in  NAD. HEENT: No proptosis, EOMI, lid lag or stare.  NECK: supple, trachea midline. Thyroid  exam demonstrates right sided palpable nodule. No tenderness on palpation.  RESPIRATORY: clear bilaterally, good inspiratory effort. CV: RRR. MUSCULOSKELETAL: normal gait and station, no clubbing, no noticeable tremor EXT: no peripheral edema SKIN: no dermatopathy or rash  NEURO: PERRL PSYC: alert and oriented, good insight   Labs Last TSH on record was: 0.61 ulU/ml on 02/27/2023   Radiology -- Thyroid  US  (07/13/2023) Narrative CLINICAL DATA:  Incidental on MRI.  EXAM: THYROID  ULTRASOUND  TECHNIQUE: Ultrasound examination of the thyroid  gland and adjacent soft tissues was performed.  COMPARISON:  None Available.  FINDINGS: Parenchymal Echotexture: Moderately heterogenous Isthmus: 0.4 cm Right lobe: 6.0 x 1.7 x 2.7 cm Left lobe: 5.9 x 1.0 x 1.9 cm _________________________________________________________  Estimated total number of nodules >/= 1 cm: 1 Number of spongiform nodules >/=  2 cm not described below (TR1): 0 Number of mixed cystic and solid nodules >/= 1.5 cm not described below (TR2): 0 _________________________________________________________  Nodule # 1: Ill-defined isoechoic solid nodule in the right upper gland measures 2.9 x 2.2 x 1.4 cm. No calcifications or suspicious features. Findings are consistent with TI-RADS category 3. **Given size (>/= 2.5 cm) and appearance, fine needle aspiration of this mildly suspicious nodule should be considered based on TI-RADS criteria.  IMPRESSION: 1. Approximately 2.9 cm TI-RADS category 3 nodule in the right upper gland meets criteria to consider fine-needle aspiration biopsy. Biopsy is recommended. 2. Enlarged and heterogeneous background thyroid  gland.  The above is in keeping with the ACR TI-RADS recommendations - J Am Coll Radiol 2017;14:587-595.   Electronically Signed   By: Wilkie Lent M.D.   On: 07/23/2023  09:52   Assessment 1. Thyroid  nodule     Plan - We discussed thyroid  nodules. She was given an educational handout about thyroid  nodules which we reviewed together. She was reassured that the vast majority of thyroid  nodules are benign. We discussed the rare possibility of thyroid  cancer and the option of a biopsy to assess for thyroid  cancer.  Method of performing a fine needle aspiration biopsy was explained. She was advised on the risks of possible side effects such as hematoma. The potential outcomes of the FNA were also discussed as well as the likely need for a follow-up ultrasound in 6-12 months, if the FNA is benign, to assess for growth of the nodule. She agreed to proceed with the FNA of the 2.9 cm right upper nodule. She was scheduled for this procedure.  - Follow up in 12 months, or sooner if necessary, to monitor thyroid  nodule.   Attestation Statement:   I personally performed the service, non-incident to. [facility])  CASSANDRA BUEL COHN, PA   cc:  Dodson Nest, MD [address] [place],  [place] [address]

## 2023-09-19 ENCOUNTER — Other Ambulatory Visit: Payer: Self-pay | Admitting: Primary Care

## 2023-09-19 DIAGNOSIS — N951 Menopausal and female climacteric states: Secondary | ICD-10-CM

## 2023-09-26 ENCOUNTER — Ambulatory Visit: Payer: Medicare Other | Admitting: Neurosurgery

## 2023-09-26 ENCOUNTER — Other Ambulatory Visit: Payer: Self-pay | Admitting: Primary Care

## 2023-09-26 DIAGNOSIS — J3089 Other allergic rhinitis: Secondary | ICD-10-CM

## 2023-09-28 DIAGNOSIS — E041 Nontoxic single thyroid nodule: Secondary | ICD-10-CM | POA: Diagnosis not present

## 2023-09-30 DIAGNOSIS — E041 Nontoxic single thyroid nodule: Secondary | ICD-10-CM | POA: Diagnosis not present

## 2023-10-02 ENCOUNTER — Ambulatory Visit: Payer: Medicare Other | Attending: Neurosurgery

## 2023-10-02 DIAGNOSIS — M5459 Other low back pain: Secondary | ICD-10-CM | POA: Insufficient documentation

## 2023-10-02 DIAGNOSIS — M5416 Radiculopathy, lumbar region: Secondary | ICD-10-CM | POA: Insufficient documentation

## 2023-10-02 DIAGNOSIS — M48062 Spinal stenosis, lumbar region with neurogenic claudication: Secondary | ICD-10-CM | POA: Diagnosis not present

## 2023-10-02 DIAGNOSIS — M47812 Spondylosis without myelopathy or radiculopathy, cervical region: Secondary | ICD-10-CM | POA: Diagnosis not present

## 2023-10-02 DIAGNOSIS — M159 Polyosteoarthritis, unspecified: Secondary | ICD-10-CM | POA: Diagnosis not present

## 2023-10-02 NOTE — Therapy (Signed)
OUTPATIENT PHYSICAL THERAPY THORACOLUMBAR EVALUATION   Patient Name: April Nguyen MRN: [id number] DOB:1950-12-23, 73 y.o., female Today's Date: 10/02/2023  END OF SESSION:  PT End of Session - 10/02/23 0949     Visit Number 1    Number of Visits 17    Date for PT Re-Evaluation 12/01/23    PT Start Time 0949    PT Stop Time 1035    PT Time Calculation (min) 46 min    Activity Tolerance Patient tolerated treatment well    Behavior During Therapy [facility] for tasks assessed/performed             Past Medical History:  Diagnosis Date   Allergy    Seasonal   Anxiety    Arthritis    neck, wrists,hands, toes, and feet   COVID-19 09/08/2021   Depression    Eustachian tube dysfunction    Family history of adverse reaction to anesthesia    brother - PONV and combative   Genital warts    In past   GERD (gastroesophageal reflux disease)    History of shingles 04/16/2015   March 2016    history of Tumor of parotid gland    Hyperlipidemia    Meniere's disease    slight hearing loss, car sickness and dizziness   Migraines    sinus/stress   Monoclonal gammopathy    Polyarthralgia    PONV (postoperative nausea and vomiting)    and headaches and combative   Pre-diabetes    RLS (restless legs syndrome)    Seizures (HCC)    Dx 16 yrs ago - hormonal - none at least 3 yrs   Seizures (HCC)    H/O due to hormone issues   Past Surgical History:  Procedure Laterality Date   ABDOMINAL HYSTERECTOMY     CATARACT EXTRACTION Bilateral 01/2023   CHOLECYSTECTOMY  09/05/2006   dialation and cartarage  1982, 1999, 2000   DISTAL INTERPHALANGEAL JOINT FUSION Left 05/24/2022   Procedure: Left index and ring DIP fusion;  Surgeon: Kennedy Bucker, MD;  Location: [facility];  Service: Orthopedics;  Laterality: Left;   FINGER SURGERY  2011, 2012   Bone fusion of middle finger right and left hands   FINGER SURGERY  09/06/2011   Pin removed from left middle finger   GANGLION CYST  EXCISION  2011, 2012   Left and right hands   MYRINGOTOMY WITH TUBE PLACEMENT Bilateral 05/13/2015   Procedure: MYRINGOTOMY WITH  BUTTERFLY TUBE PLACEMENT;  Surgeon: Bud Face, MD;  Location: [facility];  Service: ENT;  Laterality: Bilateral;   MYRINGOTOMY WITH TUBE PLACEMENT Bilateral 08/28/2019   Procedure: MYRINGOTOMY WITH BUTTERFLY TUBE PLACEMENT;  Surgeon: Bud Face, MD;  Location: [facility];  Service: ENT;  Laterality: Bilateral;   PAROTID GLAND TUMOR EXCISION  09/05/1984   TONSILLECTOMY AND ADENOIDECTOMY  09/05/1957   TUBAL LIGATION  09/05/2008   Patient Active Problem List   Diagnosis Date Noted   Chronic fatigue 02/27/2023   Hot flashes 02/27/2023   Monoclonal gammopathy 12/29/2021   Polyarthralgia 11/01/2021   Left foot pain 11/01/2021   Restless legs 03/09/2021   Prediabetes 02/26/2019   Chronic neck pain 02/26/2019   Hormone replacement therapy (HRT) 02/22/2018   Meniere disease 02/22/2018   Preventative health care 02/11/2016   Hyperlipidemia 02/11/2016   Insomnia 01/22/2016   Depression with anxiety 04/16/2015   GERD (gastroesophageal reflux disease) 04/16/2015   Arthritis 04/16/2015   ETD (eustachian tube dysfunction) 04/16/2015    PCP:  Doreene Nest, NP   REFERRING PROVIDER: Venetia Night, MD   REFERRING DIAG: [phone] (ICD-10-CM) - Neurogenic claudication due to lumbar spinal stenosis  Rationale for Evaluation and Treatment: Rehabilitation  THERAPY DIAG:  Other low back pain - Plan: PT plan of care cert/re-cert  Radiculopathy, lumbar region - Plan: PT plan of care cert/re-cert  ONSET DATE: a year ago  SUBJECTIVE:                                                                                                                                                                                           SUBJECTIVE STATEMENT: Low back pain: 6/10 currently (pt sitting on a chair), 10/10 at most for the past 3  months L inner thigh pain: 0/10 (pt sitting, per pt, pain improved after her second steroid injection); 8/10 at worst for the past 3 months (stair negotiation, getting into and out of her car, pushing the door open with L foot)  PERTINENT HISTORY:  Low back pain with neurogenic claudication. Has L LE symptoms. She also has L UE pain. Low back pain began about a year ago. Had an MRI for neck and low back. Was referred to Dr. Myer Haff who said that she had a pinched nerve in low back. Had 2 steroid injections for low back, the second injection (December 2024) helped. Has L R and L hip (greater trochanter area bilaterally). Had a steroid injection for L hip 3 months ago which helped.     No HTN per pt.  No latex allergies   PAIN:  Are you having pain? Yes: NPRS scale: 6/10 Pain location: low back and L inner thigh Pain description: achy, stiff Aggravating factors: stair negotiation, standing (for about 15-20 minutes), walking about 30 minutes in the grocery store, but sometimes a few minutes, sitting (for about 30 minutes) Relieving factors: heating pad , using a shopping cart, sitting in recliner with L leg hanging off to the side.   PRECAUTIONS: None  RED FLAGS: Bowel or bladder incontinence: Yes: both, and both Dr. Myer Haff (does not think it is due to her low back) and Dr. Mariah Milling aware and Cauda equina syndrome: Yes: L thigh tingling.     WEIGHT BEARING RESTRICTIONS: No  FALLS:  Has patient fallen in last 6 months? No  LIVING ENVIRONMENT: Lives with: lives with their spouse Lives in: House/apartment Stairs: Yes: External: 2 steps; none (side of the deck) Has following equipment at home: Grab bars  OCCUPATION: retired   PLOF: Independent  PATIENT GOALS: be able to mop the floor, go to sleep without tossing and turning, be able to walk and not hurt  NEXT MD VISIT: Not yet.  OBJECTIVE:  Note: Objective measures were completed at Evaluation unless otherwise  noted.  DIAGNOSTIC FINDINGS:  MR LUMBAR SPINE WO CONTRAST 06/24/2023  Narrative & Impression  CLINICAL DATA:  Cervicalgia.   Chronic bilateral low back pain with left-sided sciatica.   EXAM: MRI CERVICAL AND LUMBAR SPINE WITHOUT CONTRAST   TECHNIQUE: Multiplanar and multiecho pulse sequences of the cervical spine, to include the craniocervical junction and cervicothoracic junction, and lumbar spine, were obtained without intravenous contrast.   COMPARISON:  Radiographs cervical spine February 26, 2019. CT cervical spine November 21, 2011.   No prior study of the lumbar spine.   FINDINGS: MRI CERVICAL SPINE FINDINGS   Alignment: Small anterolisthesis small anterolisthesis at C3-4, C4-5, C5-6 and C7-T1, similar to prior radiograph.   Vertebrae: No fracture, evidence of discitis, or bone lesion.   Cord: Normal signal and morphology.   Posterior Fossa, vertebral arteries, paraspinal tissues: A 3 cm right thyroid lobe nodule.   Disc levels:   C1-2: Left-sided joint effusion.   C2-3: Facet degenerative changes. No significant spinal canal or neural foraminal stenosis.   C3-4: Mild disc bulge without significant spinal canal stenosis. Uncovertebral and facet degenerative change resulting in mild bilateral neural foraminal narrowing.   C4-5: Small posterior disc protrusion without significant spinal canal stenosis. Uncovertebral and facet degenerative changes resulting in moderate bilateral neural foraminal narrowing.   C5-6: Small posterior disc osteophyte complex without significant spinal canal stenosis. Uncovertebral and prominent hypertrophic facet degenerative changes resulting in mild right neural foraminal narrowing.   C6-7: Posterior disc osteophyte complex without significant spinal canal stenosis. Uncovertebral and facet degenerative change resulting in mild right and moderate to severe left neural foraminal narrowing.   C7-T1: Facet degenerative changes  resulting in mild-to-moderate left neural foraminal narrowing. No spinal canal stenosis.   MRI LUMBAR SPINE FINDINGS   Segmentation:  Standard segmentation is assumed.   Alignment:  Dextroconvex curvature.   Vertebrae: No acute fracture, evidence of discitis, or bone lesion. Chronic compression fractures of the L3 endplates with associated Schmorl nodes. Endplate degenerative changes throughout the lumbar spine, more pronounced at L4-5.   Conus medullaris and cauda equina: Conus extends to the L1 level. Conus and cauda equina appear normal.   Paraspinal and other soft tissues: Negative.   Disc levels:   T12-L1: No spinal canal or neural foraminal stenosis.   L1-2: Disc bulge with superiorly migrating left central disc extrusion and mild facet degenerative changes. Mild spinal canal stenosis with mild narrowing of the left subarticular zone and mild bilateral neural foraminal narrowing   L2-3: Loss of disc height, left asymmetric disc bulge, moderate facet degenerative changes and ligamentum flavum redundancy resulting in moderate spinal canal stenosis with moderate right and severe left subarticular zone stenosis and moderate bilateral neural foraminal narrowing.   L3-4: Loss of disc height, disc bulge, moderate facet degenerative changes and ligamentum flavum redundancy resulting mild spinal canal stenosis with moderate narrowing of the bilateral subarticular zones, right greater than left, moderate right and mild left neural foraminal narrowing.   L4-5: Loss of disc height, disc bulge, moderate hypertrophic facet degenerative changes and ligamentum flavum redundancy resulting in mild-to-moderate spinal canal stenosis with moderate narrowing of the bilateral subarticular zones and moderate bilateral neural foraminal narrowing.   L5-S1: Shallow disc bulge and moderate facet degenerative changes without significant spinal canal or neural foraminal stenosis.    IMPRESSION: 1. Degenerative changes of the cervical spine without high-grade spinal canal stenosis at any  level. 2. Moderate bilateral neural foraminal narrowing at C4-5. 3. Mild right and moderate to severe left neural foraminal narrowing at C6-7. 4. Mild-to-moderate left neural foraminal narrowing at C7-T1. 5. Moderate spinal canal stenosis with moderate right and severe left subarticular zone stenosis and moderate bilateral neural foraminal narrowing at L2-3. 6. Mild spinal canal stenosis with moderate narrowing of the bilateral subarticular zones, moderate right and mild left neural foraminal narrowing at L3-4. 7. Mild-to-moderate spinal canal stenosis with moderate narrowing of the bilateral subarticular zones and moderate bilateral neural foraminal narrowing at L4-5. 8. A 3 cm right thyroid lobe nodule. Recommend thyroid ultrasound for further evaluation.     Electronically Signed   By: Baldemar Lenis M.D.   On: 07/11/2023 10:15       PATIENT SURVEYS:  Modified Oswestry 52% (10/02/2023)   COGNITION: Overall cognitive status: Within functional limits for tasks assessed     SENSATION: Not tested    POSTURE: forward neck, B protracted shouldres, R knee slightly higher, R hip in ER, R lumbar convexity, forward flexed, decreased B hip extension, R lateral shift  Decreased low back pain with R lateral shift correction  PALPATION: TTP L greater trochanter L > R   B lumbar paraspinal muscle tenision R > L  TTP low back R > L L5 > L4 > L3 > L2  LUMBAR ROM:   AROM eval  Flexion WFL  Extension Limtied with L low back pain and L L5 dermatome symptoms to L lateral foot.   Right lateral flexion WFL with low back discomfort  Left lateral flexion WFL with low back discomfort  Right rotation full  Left rotation Limited with L L5 pain to foot   (Blank rows = not tested)  LOWER EXTREMITY ROM:     Passive  Right eval Left eval  Hip flexion    Hip  extension    Hip abduction    Hip adduction    Hip internal rotation    Hip external rotation    Knee flexion    Knee extension    Ankle dorsiflexion    Ankle plantarflexion    Ankle inversion    Ankle eversion     (Blank rows = not tested)  LOWER EXTREMITY MMT:    MMT Right eval Left eval  Hip flexion 4 (with low back pain) 4- with L lateral hip pain  Hip extension 3- 3-  Hip abduction (seated manually resisted) 4 4-  Hip adduction    Hip internal rotation    Hip external rotation    Knee flexion 5 4  Knee extension 5 4+ with L L5 dermatome pain  Ankle dorsiflexion    Ankle plantarflexion    Ankle inversion    Ankle eversion     (Blank rows = not tested)  LUMBAR SPECIAL TESTS:  (+) repeated flexion test.   FUNCTIONAL TESTS:    GAIT: Distance walked: 30 ft Assistive device utilized: None Level of assistance: Complete Independence Comments: R lateral lean, decreased stance R LE, antalgic R LE  TREATMENT DATE: 10/02/2023  PATIENT EDUCATION:  Education details: Plan of care Person educated: Patient Education method: Explanation Education comprehension: verbalized understanding  HOME EXERCISE PROGRAM:   ASSESSMENT:  CLINICAL IMPRESSION: Patient is a 73 y.o. female who was seen today for physical therapy evaluation and treatment for neurogenic claudication due to lumbar spinal stenosis. She also presents with altered gait pattern and posture, low back pain and L LE radiating symptoms, decreased trunk and B hip strength, TTP B greater trochanters, and low back, increased lumbar paraspinal muscle tension, reproduction of pain with lumbar AROM, and difficulty performing tasks which involve standing, walking, sitting, and stair negotiation secondary to pain. Pt will benefit from skilled physical therapy services to address the aforementioned  deficits.      OBJECTIVE IMPAIRMENTS: difficulty walking, decreased strength, improper body mechanics, postural dysfunction, and pain.   ACTIVITY LIMITATIONS: carrying, lifting, bending, sitting, standing, stairs, and locomotion level  PARTICIPATION LIMITATIONS:   PERSONAL FACTORS: Age, Fitness, Past/current experiences, Time since onset of injury/illness/exacerbation, and 3+ comorbidities: anxiety, arthritis, depression, hx of parotid gland tumor, seizures  are also affecting patient's functional outcome.   REHAB POTENTIAL: Fair    CLINICAL DECISION MAKING: Stable/uncomplicated  EVALUATION COMPLEXITY: Low   GOALS: Goals reviewed with patient? Yes  SHORT TERM GOALS: Target date: 10/13/2023  Pt will be independent with her initial HEP to decrease pain, improve strength, function, and ability to perform tasks that involve standing, and walking more comfortably for her back.  Baseline:Pt has not yet started her initial HEP (10/02/2023) Goal status: INITIAL   LONG TERM GOALS: Target date: 12/01/2023  Pt will have a decrease in low back pain to 5/10 or less at worst to promote ability to perform standing and walking tasks as well as negotiate stairs more comfortably.  Baseline: low back pain 10/10 at most for the past 3 months (10/02/2023) Goal status: INITIAL  2.  Pt will have a decrease in L medial thigh pain to 3/10 or less at worst to promote ability to perform standing tasks as well as get into and out of vehicles more comfortably.   Baseline: L inner thigh pain 8/10 at worst for the past 3 months Goal status: INITIAL  3.  Pt will improve her B hip extension and abduction strength by at least 1/2 MMT grade to promote ability to perform standing tasks as well ambulate more comfortably for her back.  Baseline:  MMT Right eval Left eval  Hip extension 3- 3-  Hip abduction (seated manually resisted) 4 4-   (10/02/2023)   Goal status: INITIAL  4.  Pt will improve her Modified  Oswestry Low Back Pain disability questionnaire score by at least 10% as a demonstration of improved function.  Baseline: Modified Oswestry Low Back Pain disability questionnaire 52% (10/02/2023) Goal status: INITIAL    PLAN:  PT FREQUENCY: 1-2x/week  PT DURATION: 8 weeks  PLANNED INTERVENTIONS: 97110-Therapeutic exercises, 97530- Therapeutic activity, [id number]- Neuromuscular re-education, 97535- Self Care, 13086- Manual therapy, [id number]- Aquatic Therapy, 97014- Electrical stimulation (unattended), [id number]- Traction (mechanical), [id number]- Ionotophoresis 4mg /ml Dexamethasone, Patient/Family education, Dry Needling, Joint mobilization, and Spinal mobilization.  PLAN FOR NEXT SESSION: posture, scapular, trunk, glute strengthening, lumbopelvic control, manual techniques, modalities PRN   Alverto Shedd, PT, DPT 10/02/2023, 2:43 PM

## 2023-10-04 ENCOUNTER — Ambulatory Visit: Payer: Medicare Other

## 2023-10-06 DIAGNOSIS — R159 Full incontinence of feces: Secondary | ICD-10-CM | POA: Diagnosis not present

## 2023-10-06 DIAGNOSIS — R198 Other specified symptoms and signs involving the digestive system and abdomen: Secondary | ICD-10-CM | POA: Diagnosis not present

## 2023-10-06 DIAGNOSIS — R1033 Periumbilical pain: Secondary | ICD-10-CM | POA: Diagnosis not present

## 2023-10-09 ENCOUNTER — Ambulatory Visit: Payer: Medicare Other | Attending: Neurosurgery

## 2023-10-09 DIAGNOSIS — M5416 Radiculopathy, lumbar region: Secondary | ICD-10-CM | POA: Diagnosis not present

## 2023-10-09 DIAGNOSIS — M5459 Other low back pain: Secondary | ICD-10-CM | POA: Diagnosis not present

## 2023-10-09 NOTE — Therapy (Addendum)
OUTPATIENT PHYSICAL THERAPY TREATMENT   Patient Name: April Nguyen MRN: [id number] DOB:[birth year], 73 y.o., female Today's Date: 10/09/2023  END OF SESSION:  PT End of Session - 10/09/23 1119     Visit Number 2    Number of Visits 17    Date for PT Re-Evaluation 12/01/23    PT Start Time 1119    PT Stop Time 1204    PT Time Calculation (min) 45 min    Activity Tolerance Patient tolerated treatment well    Behavior During Therapy [facility] for tasks assessed/performed              Past Medical History:  Diagnosis Date   Allergy    Seasonal   Anxiety    Arthritis    neck, wrists,hands, toes, and feet   COVID-19 09/08/2021   Depression    Eustachian tube dysfunction    Family history of adverse reaction to anesthesia    brother - PONV and combative   Genital warts    In past   GERD (gastroesophageal reflux disease)    History of shingles 04/16/2015   March 2016    history of Tumor of parotid gland    Hyperlipidemia    Meniere's disease    slight hearing loss, car sickness and dizziness   Migraines    sinus/stress   Monoclonal gammopathy    Polyarthralgia    PONV (postoperative nausea and vomiting)    and headaches and combative   Pre-diabetes    RLS (restless legs syndrome)    Seizures (HCC)    Dx 16 yrs ago - hormonal - none at least 3 yrs   Seizures (HCC)    H/O due to hormone issues   Past Surgical History:  Procedure Laterality Date   ABDOMINAL HYSTERECTOMY     CATARACT EXTRACTION Bilateral 01/2023   CHOLECYSTECTOMY  09/05/2006   dialation and cartarage  1982, 1999, 2000   DISTAL INTERPHALANGEAL JOINT FUSION Left 05/24/2022   Procedure: Left index and ring DIP fusion;  Surgeon: Kennedy Bucker, MD;  Location: [facility];  Service: Orthopedics;  Laterality: Left;   FINGER SURGERY  2011, 2012   Bone fusion of middle finger right and left hands   FINGER SURGERY  09/06/2011   Pin removed from left middle finger   GANGLION CYST EXCISION  2011,  2012   Left and right hands   MYRINGOTOMY WITH TUBE PLACEMENT Bilateral 05/13/2015   Procedure: MYRINGOTOMY WITH  BUTTERFLY TUBE PLACEMENT;  Surgeon: Bud Face, MD;  Location: [facility];  Service: ENT;  Laterality: Bilateral;   MYRINGOTOMY WITH TUBE PLACEMENT Bilateral 08/28/2019   Procedure: MYRINGOTOMY WITH BUTTERFLY TUBE PLACEMENT;  Surgeon: Bud Face, MD;  Location: [facility];  Service: ENT;  Laterality: Bilateral;   PAROTID GLAND TUMOR EXCISION  09/05/1984   TONSILLECTOMY AND ADENOIDECTOMY  09/05/1957   TUBAL LIGATION  09/05/2008   Patient Active Problem List   Diagnosis Date Noted   Chronic fatigue 02/27/2023   Hot flashes 02/27/2023   Monoclonal gammopathy 12/29/2021   Polyarthralgia 11/01/2021   Left foot pain 11/01/2021   Restless legs 03/09/2021   Prediabetes 02/26/2019   Chronic neck pain 02/26/2019   Hormone replacement therapy (HRT) 02/22/2018   Meniere disease 02/22/2018   Preventative health care 02/11/2016   Hyperlipidemia 02/11/2016   Insomnia 01/22/2016   Depression with anxiety 04/16/2015   GERD (gastroesophageal reflux disease) 04/16/2015   Arthritis 04/16/2015   ETD (eustachian tube dysfunction) 04/16/2015    PCP:  Doreene Nest, NP   REFERRING PROVIDER: Venetia Night, MD   REFERRING DIAG: [phone] (ICD-10-CM) - Neurogenic claudication due to lumbar spinal stenosis  Rationale for Evaluation and Treatment: Rehabilitation  THERAPY DIAG:  Other low back pain  Radiculopathy, lumbar region  ONSET DATE: a year ago  SUBJECTIVE:                                                                                                                                                                                           SUBJECTIVE STATEMENT: Low back has been bothering her for the past 3 days. Was tired after last session, back was ok, just tired. No low back pain currently in sitting.       PERTINENT HISTORY:   Low back pain with neurogenic claudication. Has L LE symptoms. She also has L UE pain. Low back pain began about a year ago. Had an MRI for neck and low back. Was referred to Dr. Myer Haff who said that she had a pinched nerve in low back. Had 2 steroid injections for low back, the second injection (December 2024) helped. Has L R and L hip (greater trochanter area bilaterally). Had a steroid injection for L hip 3 months ago which helped.    No hx of osteoporosis No HTN per pt.  No latex allergies   PAIN:  Are you having pain? Yes: NPRS scale: 6/10 Pain location: low back and L inner thigh Pain description: achy, stiff Aggravating factors: stair negotiation, standing (for about 15-20 minutes), walking about 30 minutes in the grocery store, but sometimes a few minutes, sitting (for about 30 minutes) Relieving factors: heating pad , using a shopping cart, sitting in recliner with L leg hanging off to the side.   PRECAUTIONS: None  RED FLAGS: Bowel or bladder incontinence: Yes: both, and both Dr. Myer Haff (does not think it is due to her low back) and Dr. Mariah Milling aware and Cauda equina syndrome: Yes: L thigh tingling.     WEIGHT BEARING RESTRICTIONS: No  FALLS:  Has patient fallen in last 6 months? No  LIVING ENVIRONMENT: Lives with: lives with their spouse Lives in: House/apartment Stairs: Yes: External: 2 steps; none (side of the deck) Has following equipment at home: Grab bars  OCCUPATION: retired   PLOF: Independent  PATIENT GOALS: be able to mop the floor, go to sleep without tossing and turning, be able to walk and not hurt  NEXT MD VISIT: Not yet.  OBJECTIVE:  Note: Objective measures were completed at Evaluation unless otherwise noted.  DIAGNOSTIC FINDINGS:  MR LUMBAR SPINE WO CONTRAST 06/24/2023  Narrative & Impression  CLINICAL DATA:  Cervicalgia.   Chronic bilateral low back pain with left-sided sciatica.   EXAM: MRI CERVICAL AND LUMBAR SPINE WITHOUT  CONTRAST   TECHNIQUE: Multiplanar and multiecho pulse sequences of the cervical spine, to include the craniocervical junction and cervicothoracic junction, and lumbar spine, were obtained without intravenous contrast.   COMPARISON:  Radiographs cervical spine February 26, 2019. CT cervical spine November 21, 2011.   No prior study of the lumbar spine.   FINDINGS: MRI CERVICAL SPINE FINDINGS   Alignment: Small anterolisthesis small anterolisthesis at C3-4, C4-5, C5-6 and C7-T1, similar to prior radiograph.   Vertebrae: No fracture, evidence of discitis, or bone lesion.   Cord: Normal signal and morphology.   Posterior Fossa, vertebral arteries, paraspinal tissues: A 3 cm right thyroid lobe nodule.   Disc levels:   C1-2: Left-sided joint effusion.   C2-3: Facet degenerative changes. No significant spinal canal or neural foraminal stenosis.   C3-4: Mild disc bulge without significant spinal canal stenosis. Uncovertebral and facet degenerative change resulting in mild bilateral neural foraminal narrowing.   C4-5: Small posterior disc protrusion without significant spinal canal stenosis. Uncovertebral and facet degenerative changes resulting in moderate bilateral neural foraminal narrowing.   C5-6: Small posterior disc osteophyte complex without significant spinal canal stenosis. Uncovertebral and prominent hypertrophic facet degenerative changes resulting in mild right neural foraminal narrowing.   C6-7: Posterior disc osteophyte complex without significant spinal canal stenosis. Uncovertebral and facet degenerative change resulting in mild right and moderate to severe left neural foraminal narrowing.   C7-T1: Facet degenerative changes resulting in mild-to-moderate left neural foraminal narrowing. No spinal canal stenosis.   MRI LUMBAR SPINE FINDINGS   Segmentation:  Standard segmentation is assumed.   Alignment:  Dextroconvex curvature.   Vertebrae: No acute  fracture, evidence of discitis, or bone lesion. Chronic compression fractures of the L3 endplates with associated Schmorl nodes. Endplate degenerative changes throughout the lumbar spine, more pronounced at L4-5.   Conus medullaris and cauda equina: Conus extends to the L1 level. Conus and cauda equina appear normal.   Paraspinal and other soft tissues: Negative.   Disc levels:   T12-L1: No spinal canal or neural foraminal stenosis.   L1-2: Disc bulge with superiorly migrating left central disc extrusion and mild facet degenerative changes. Mild spinal canal stenosis with mild narrowing of the left subarticular zone and mild bilateral neural foraminal narrowing   L2-3: Loss of disc height, left asymmetric disc bulge, moderate facet degenerative changes and ligamentum flavum redundancy resulting in moderate spinal canal stenosis with moderate right and severe left subarticular zone stenosis and moderate bilateral neural foraminal narrowing.   L3-4: Loss of disc height, disc bulge, moderate facet degenerative changes and ligamentum flavum redundancy resulting mild spinal canal stenosis with moderate narrowing of the bilateral subarticular zones, right greater than left, moderate right and mild left neural foraminal narrowing.   L4-5: Loss of disc height, disc bulge, moderate hypertrophic facet degenerative changes and ligamentum flavum redundancy resulting in mild-to-moderate spinal canal stenosis with moderate narrowing of the bilateral subarticular zones and moderate bilateral neural foraminal narrowing.   L5-S1: Shallow disc bulge and moderate facet degenerative changes without significant spinal canal or neural foraminal stenosis.   IMPRESSION: 1. Degenerative changes of the cervical spine without high-grade spinal canal stenosis at any level. 2. Moderate bilateral neural foraminal narrowing at C4-5. 3. Mild right and moderate to severe left neural foraminal  narrowing at C6-7. 4. Mild-to-moderate left neural foraminal narrowing at C7-T1. 5. Moderate spinal canal stenosis with  moderate right and severe left subarticular zone stenosis and moderate bilateral neural foraminal narrowing at L2-3. 6. Mild spinal canal stenosis with moderate narrowing of the bilateral subarticular zones, moderate right and mild left neural foraminal narrowing at L3-4. 7. Mild-to-moderate spinal canal stenosis with moderate narrowing of the bilateral subarticular zones and moderate bilateral neural foraminal narrowing at L4-5. 8. A 3 cm right thyroid lobe nodule. Recommend thyroid ultrasound for further evaluation.     Electronically Signed   By: Baldemar Lenis M.D.   On: 07/11/2023 10:15       PATIENT SURVEYS:  Modified Oswestry 52% (10/02/2023)   COGNITION: Overall cognitive status: Within functional limits for tasks assessed     SENSATION: Not tested    POSTURE: forward neck, B protracted shouldres, R knee slightly higher, R hip in ER, R lumbar convexity, forward flexed, decreased B hip extension, R lateral shift  Decreased low back pain with R lateral shift correction  PALPATION: TTP L greater trochanter L > R   B lumbar paraspinal muscle tenision R > L  TTP low back R > L L5 > L4 > L3 > L2  LUMBAR ROM:   AROM eval  Flexion WFL  Extension Limtied with L low back pain and L L5 dermatome symptoms to L lateral foot.   Right lateral flexion WFL with low back discomfort  Left lateral flexion WFL with low back discomfort  Right rotation full  Left rotation Limited with L L5 pain to foot   (Blank rows = not tested)  LOWER EXTREMITY ROM:     Passive  Right eval Left eval  Hip flexion    Hip extension    Hip abduction    Hip adduction    Hip internal rotation    Hip external rotation    Knee flexion    Knee extension    Ankle dorsiflexion    Ankle plantarflexion    Ankle inversion    Ankle eversion     (Blank rows  = not tested)  LOWER EXTREMITY MMT:    MMT Right eval Left eval  Hip flexion 4 (with low back pain) 4- with L lateral hip pain  Hip extension 3- 3-  Hip abduction (seated manually resisted) 4 4-  Hip adduction    Hip internal rotation    Hip external rotation    Knee flexion 5 4  Knee extension 5 4+ with L L5 dermatome pain  Ankle dorsiflexion    Ankle plantarflexion    Ankle inversion    Ankle eversion     (Blank rows = not tested)  LUMBAR SPECIAL TESTS:  (+) repeated flexion test.   FUNCTIONAL TESTS:    GAIT: Distance walked: 30 ft Assistive device utilized: None Level of assistance: Complete Independence Comments: R lateral lean, decreased stance R LE, antalgic R LE  TREATMENT DATE: 10/09/2023  Manual therapy  Prone R UPA to L5 TP grade 3,   R UPA to L4, L3, L2, L1 TP grade 3-  Performed to promote movement and decrease stiffness.   Prone STM R lumbar paraspinal muscles to decrease tension.   Neuromuscular re-education  Prone glute set 10x5 seconds for 3 sets  Hooklying transversus contraction 10x5 seconds for 2 sets  Hooklying posterior pelvic tilt 10x5 seconds for 3 sets   Then with march 10x2 each LE, emphasis on stable pelvis    Improved exercise technique, movement at target joints, use of target muscles after mod verbal, visual, tactile cues.      PATIENT EDUCATION:  Education details: There-ex, HEP Person educated: Patient Education method: Explanation, Demonstration, Tactile cues, Verbal cues, and Handouts Education comprehension: verbalized understanding and returned demonstration  HOME EXERCISE PROGRAM: Access Code: [email or web address] URL: [email or web address] Date: 10/09/2023 Prepared by: Loralyn Freshwater  Exercises - Supine Transversus Abdominis Bracing - Hands on Stomach  - 5 x daily - 7 x weekly - 3 sets -  10 reps - 5 seconds hold - Supine Posterior Pelvic Tilt  - 1 x daily - 7 x weekly - 3 sets - 10 reps - 5 seconds  hold     ASSESSMENT:  CLINICAL IMPRESSION:   Worked on trunk muscle activation and lumbopelvic control secondary to difficulty dissociating low back from hip movements observed. Also worked on joint mobility to promote movement and decrease stiffness. No low back and L inner thigh pain reported after session. Pt will benefit from skilled physical therapy services to decrease pain, improve strength and function.        OBJECTIVE IMPAIRMENTS: difficulty walking, decreased strength, improper body mechanics, postural dysfunction, and pain.   ACTIVITY LIMITATIONS: carrying, lifting, bending, sitting, standing, stairs, and locomotion level  PARTICIPATION LIMITATIONS:   PERSONAL FACTORS: Age, Fitness, Past/current experiences, Time since onset of injury/illness/exacerbation, and 3+ comorbidities: anxiety, arthritis, depression, hx of parotid gland tumor, seizures  are also affecting patient's functional outcome.   REHAB POTENTIAL: Fair    CLINICAL DECISION MAKING: Stable/uncomplicated  EVALUATION COMPLEXITY: Low   GOALS: Goals reviewed with patient? Yes  SHORT TERM GOALS: Target date: 10/13/2023  Pt will be independent with her initial HEP to decrease pain, improve strength, function, and ability to perform tasks that involve standing, and walking more comfortably for her back.  Baseline:Pt has not yet started her initial HEP (10/02/2023) Goal status: INITIAL   LONG TERM GOALS: Target date: 12/01/2023  Pt will have a decrease in low back pain to 5/10 or less at worst to promote ability to perform standing and walking tasks as well as negotiate stairs more comfortably.  Baseline: low back pain 10/10 at most for the past 3 months (10/02/2023) Goal status: INITIAL  2.  Pt will have a decrease in L medial thigh pain to 3/10 or less at worst to promote ability to perform  standing tasks as well as get into and out of vehicles more comfortably.   Baseline: L inner thigh pain 8/10 at worst for the past 3 months Goal status: INITIAL  3.  Pt will improve her B hip extension and abduction strength by at least 1/2 MMT grade to promote ability to perform standing tasks as well ambulate more comfortably for her back.  Baseline:  MMT Right eval Left eval  Hip extension 3- 3-  Hip abduction (seated manually resisted) 4 4-   (10/02/2023)   Goal status: INITIAL  4.  Pt will improve her Modified  Oswestry Low Back Pain disability questionnaire score by at least 10% as a demonstration of improved function.  Baseline: Modified Oswestry Low Back Pain disability questionnaire 52% (10/02/2023) Goal status: INITIAL    PLAN:  PT FREQUENCY: 1-2x/week  PT DURATION: 8 weeks  PLANNED INTERVENTIONS: 97110-Therapeutic exercises, 97530- Therapeutic activity, 97112- Neuromuscular re-education, 97535- Self Care, [id number]- Manual therapy, [id number]- Aquatic Therapy, 97014- Electrical stimulation (unattended), [id number]- Traction (mechanical), [id number]- Ionotophoresis 4mg /ml Dexamethasone, Patient/Family education, Dry Needling, Joint mobilization, and Spinal mobilization.  PLAN FOR NEXT SESSION: posture, scapular, trunk, glute strengthening, lumbopelvic control, manual techniques, modalities PRN   Drayton Tieu, PT, DPT 10/09/2023, 12:09 PM

## 2023-10-11 ENCOUNTER — Ambulatory Visit: Payer: Medicare Other

## 2023-10-11 DIAGNOSIS — M5416 Radiculopathy, lumbar region: Secondary | ICD-10-CM | POA: Diagnosis not present

## 2023-10-11 DIAGNOSIS — M5459 Other low back pain: Secondary | ICD-10-CM | POA: Diagnosis not present

## 2023-10-11 NOTE — Therapy (Signed)
 OUTPATIENT PHYSICAL THERAPY TREATMENT   Patient Name: CERISSA ZEIGER MRN: [id number] DOB:1951/04/19, 73 y.o., female Today's Date: 10/11/2023  END OF SESSION:  PT End of Session - 10/11/23 1121     Visit Number 3    Number of Visits 17    Date for PT Re-Evaluation 12/01/23    PT Start Time 1121    PT Stop Time 1204    PT Time Calculation (min) 43 min    Activity Tolerance Patient tolerated treatment well    Behavior During Therapy [facility] for tasks assessed/performed               Past Medical History:  Diagnosis Date   Allergy    Seasonal   Anxiety    Arthritis    neck, wrists,hands, toes, and feet   COVID-19 09/08/2021   Depression    Eustachian tube dysfunction    Family history of adverse reaction to anesthesia    brother - PONV and combative   Genital warts    In past   GERD (gastroesophageal reflux disease)    History of shingles 04/16/2015   March 2016    history of Tumor of parotid gland    Hyperlipidemia    Meniere's disease    slight hearing loss, car sickness and dizziness   Migraines    sinus/stress   Monoclonal gammopathy    Polyarthralgia    PONV (postoperative nausea and vomiting)    and headaches and combative   Pre-diabetes    RLS (restless legs syndrome)    Seizures (HCC)    Dx 16 yrs ago - hormonal - none at least 3 yrs   Seizures (HCC)    H/O due to hormone issues   Past Surgical History:  Procedure Laterality Date   ABDOMINAL HYSTERECTOMY     CATARACT EXTRACTION Bilateral 01/2023   CHOLECYSTECTOMY  09/05/2006   dialation and cartarage  1982, 1999, 2000   DISTAL INTERPHALANGEAL JOINT FUSION Left 05/24/2022   Procedure: Left index and ring DIP fusion;  Surgeon: Kathlynn Sharper, MD;  Location: [facility];  Service: Orthopedics;  Laterality: Left;   FINGER SURGERY  2011, 2012   Bone fusion of middle finger right and left hands   FINGER SURGERY  09/06/2011   Pin removed from left middle finger   GANGLION CYST EXCISION  2011,  2012   Left and right hands   MYRINGOTOMY WITH TUBE PLACEMENT Bilateral 05/13/2015   Procedure: MYRINGOTOMY WITH  BUTTERFLY TUBE PLACEMENT;  Surgeon: Carolee Hunter, MD;  Location: [facility];  Service: ENT;  Laterality: Bilateral;   MYRINGOTOMY WITH TUBE PLACEMENT Bilateral 08/28/2019   Procedure: MYRINGOTOMY WITH BUTTERFLY TUBE PLACEMENT;  Surgeon: Hunter Carolee, MD;  Location: [facility];  Service: ENT;  Laterality: Bilateral;   PAROTID GLAND TUMOR EXCISION  09/05/1984   TONSILLECTOMY AND ADENOIDECTOMY  09/05/1957   TUBAL LIGATION  09/05/2008   Patient Active Problem List   Diagnosis Date Noted   Chronic fatigue 02/27/2023   Hot flashes 02/27/2023   Monoclonal gammopathy 12/29/2021   Polyarthralgia 11/01/2021   Left foot pain 11/01/2021   Restless legs 03/09/2021   Prediabetes 02/26/2019   Chronic neck pain 02/26/2019   Hormone replacement therapy (HRT) 02/22/2018   Meniere disease 02/22/2018   Preventative health care 02/11/2016   Hyperlipidemia 02/11/2016   Insomnia 01/22/2016   Depression with anxiety 04/16/2015   GERD (gastroesophageal reflux disease) 04/16/2015   Arthritis 04/16/2015   ETD (eustachian tube dysfunction) 04/16/2015  PCP: Gretta Comer POUR, NP   REFERRING PROVIDER: Clois Fret, MD   REFERRING DIAG: [id number] (ICD-10-CM) - Neurogenic claudication due to lumbar spinal stenosis  Rationale for Evaluation and Treatment: Rehabilitation  THERAPY DIAG:  Other low back pain  Radiculopathy, lumbar region  ONSET DATE: a year ago  SUBJECTIVE:                                                                                                                                                                                           SUBJECTIVE STATEMENT: Low back is ok. 4/10 currently. Was a little stiff after last session. No L thigh pain.         PERTINENT HISTORY:  Low back pain with neurogenic claudication. Has L LE  symptoms. She also has L UE pain. Low back pain began about a year ago. Had an MRI for neck and low back. Was referred to Dr. Clois who said that she had a pinched nerve in low back. Had 2 steroid injections for low back, the second injection (December 2024) helped. Has L R and L hip (greater trochanter area bilaterally). Had a steroid injection for L hip 3 months ago which helped.    No hx of osteoporosis No HTN per pt.  No latex allergies   PAIN:  Are you having pain? Yes: NPRS scale: 6/10 Pain location: low back and L inner thigh Pain description: achy, stiff Aggravating factors: stair negotiation, standing (for about 15-20 minutes), walking about 30 minutes in the grocery store, but sometimes a few minutes, sitting (for about 30 minutes) Relieving factors: heating pad , using a shopping cart, sitting in recliner with L leg hanging off to the side.   PRECAUTIONS: None  RED FLAGS: Bowel or bladder incontinence: Yes: both, and both Dr. Clois (does not think it is due to her low back) and Dr. Dodson aware and Cauda equina syndrome: Yes: L thigh tingling.     WEIGHT BEARING RESTRICTIONS: No  FALLS:  Has patient fallen in last 6 months? No  LIVING ENVIRONMENT: Lives with: lives with their spouse Lives in: House/apartment Stairs: Yes: External: 2 steps; none (side of the deck) Has following equipment at home: Grab bars  OCCUPATION: retired   PLOF: Independent  PATIENT GOALS: be able to mop the floor, go to sleep without tossing and turning, be able to walk and not hurt  NEXT MD VISIT: Not yet.  OBJECTIVE:  Note: Objective measures were completed at Evaluation unless otherwise noted.  DIAGNOSTIC FINDINGS:  MR LUMBAR SPINE WO CONTRAST 06/24/2023  Narrative & Impression  CLINICAL DATA:  Cervicalgia.   Chronic bilateral low back  pain with left-sided sciatica.   EXAM: MRI CERVICAL AND LUMBAR SPINE WITHOUT CONTRAST   TECHNIQUE: Multiplanar and multiecho pulse  sequences of the cervical spine, to include the craniocervical junction and cervicothoracic junction, and lumbar spine, were obtained without intravenous contrast.   COMPARISON:  Radiographs cervical spine February 26, 2019. CT cervical spine November 21, 2011.   No prior study of the lumbar spine.   FINDINGS: MRI CERVICAL SPINE FINDINGS   Alignment: Small anterolisthesis small anterolisthesis at C3-4, C4-5, C5-6 and C7-T1, similar to prior radiograph.   Vertebrae: No fracture, evidence of discitis, or bone lesion.   Cord: Normal signal and morphology.   Posterior Fossa, vertebral arteries, paraspinal tissues: A 3 cm right thyroid  lobe nodule.   Disc levels:   C1-2: Left-sided joint effusion.   C2-3: Facet degenerative changes. No significant spinal canal or neural foraminal stenosis.   C3-4: Mild disc bulge without significant spinal canal stenosis. Uncovertebral and facet degenerative change resulting in mild bilateral neural foraminal narrowing.   C4-5: Small posterior disc protrusion without significant spinal canal stenosis. Uncovertebral and facet degenerative changes resulting in moderate bilateral neural foraminal narrowing.   C5-6: Small posterior disc osteophyte complex without significant spinal canal stenosis. Uncovertebral and prominent hypertrophic facet degenerative changes resulting in mild right neural foraminal narrowing.   C6-7: Posterior disc osteophyte complex without significant spinal canal stenosis. Uncovertebral and facet degenerative change resulting in mild right and moderate to severe left neural foraminal narrowing.   C7-T1: Facet degenerative changes resulting in mild-to-moderate left neural foraminal narrowing. No spinal canal stenosis.   MRI LUMBAR SPINE FINDINGS   Segmentation:  Standard segmentation is assumed.   Alignment:  Dextroconvex curvature.   Vertebrae: No acute fracture, evidence of discitis, or bone lesion. Chronic  compression fractures of the L3 endplates with associated Schmorl nodes. Endplate degenerative changes throughout the lumbar spine, more pronounced at L4-5.   Conus medullaris and cauda equina: Conus extends to the L1 level. Conus and cauda equina appear normal.   Paraspinal and other soft tissues: Negative.   Disc levels:   T12-L1: No spinal canal or neural foraminal stenosis.   L1-2: Disc bulge with superiorly migrating left central disc extrusion and mild facet degenerative changes. Mild spinal canal stenosis with mild narrowing of the left subarticular zone and mild bilateral neural foraminal narrowing   L2-3: Loss of disc height, left asymmetric disc bulge, moderate facet degenerative changes and ligamentum flavum redundancy resulting in moderate spinal canal stenosis with moderate right and severe left subarticular zone stenosis and moderate bilateral neural foraminal narrowing.   L3-4: Loss of disc height, disc bulge, moderate facet degenerative changes and ligamentum flavum redundancy resulting mild spinal canal stenosis with moderate narrowing of the bilateral subarticular zones, right greater than left, moderate right and mild left neural foraminal narrowing.   L4-5: Loss of disc height, disc bulge, moderate hypertrophic facet degenerative changes and ligamentum flavum redundancy resulting in mild-to-moderate spinal canal stenosis with moderate narrowing of the bilateral subarticular zones and moderate bilateral neural foraminal narrowing.   L5-S1: Shallow disc bulge and moderate facet degenerative changes without significant spinal canal or neural foraminal stenosis.   IMPRESSION: 1. Degenerative changes of the cervical spine without high-grade spinal canal stenosis at any level. 2. Moderate bilateral neural foraminal narrowing at C4-5. 3. Mild right and moderate to severe left neural foraminal narrowing at C6-7. 4. Mild-to-moderate left neural foraminal  narrowing at C7-T1. 5. Moderate spinal canal stenosis with moderate right and severe left subarticular zone  stenosis and moderate bilateral neural foraminal narrowing at L2-3. 6. Mild spinal canal stenosis with moderate narrowing of the bilateral subarticular zones, moderate right and mild left neural foraminal narrowing at L3-4. 7. Mild-to-moderate spinal canal stenosis with moderate narrowing of the bilateral subarticular zones and moderate bilateral neural foraminal narrowing at L4-5. 8. A 3 cm right thyroid  lobe nodule. Recommend thyroid  ultrasound for further evaluation.     Electronically Signed   By: Katyucia  de Macedo Rodrigues M.D.   On: 07/11/2023 10:15       PATIENT SURVEYS:  Modified Oswestry 52% (10/02/2023)   COGNITION: Overall cognitive status: Within functional limits for tasks assessed     SENSATION: Not tested    POSTURE: forward neck, B protracted shouldres, R knee slightly higher, R hip in ER, R lumbar convexity, forward flexed, decreased B hip extension, R lateral shift  Decreased low back pain with R lateral shift correction  PALPATION: TTP L greater trochanter L > R   B lumbar paraspinal muscle tenision R > L  TTP low back R > L L5 > L4 > L3 > L2  LUMBAR ROM:   AROM eval  Flexion WFL  Extension Limtied with L low back pain and L L5 dermatome symptoms to L lateral foot.   Right lateral flexion WFL with low back discomfort  Left lateral flexion WFL with low back discomfort  Right rotation full  Left rotation Limited with L L5 pain to foot   (Blank rows = not tested)  LOWER EXTREMITY ROM:     Passive  Right eval Left eval  Hip flexion    Hip extension    Hip abduction    Hip adduction    Hip internal rotation    Hip external rotation    Knee flexion    Knee extension    Ankle dorsiflexion    Ankle plantarflexion    Ankle inversion    Ankle eversion     (Blank rows = not tested)  LOWER EXTREMITY MMT:    MMT Right eval  Left eval  Hip flexion 4 (with low back pain) 4- with L lateral hip pain  Hip extension 3- 3-  Hip abduction (seated manually resisted) 4 4-  Hip adduction    Hip internal rotation    Hip external rotation    Knee flexion 5 4  Knee extension 5 4+ with L L5 dermatome pain  Ankle dorsiflexion    Ankle plantarflexion    Ankle inversion    Ankle eversion     (Blank rows = not tested)  LUMBAR SPECIAL TESTS:  (+) repeated flexion test.   FUNCTIONAL TESTS:    GAIT: Distance walked: 30 ft Assistive device utilized: None Level of assistance: Complete Independence Comments: R lateral lean, decreased stance R LE, antalgic R LE  TREATMENT DATE: 10/11/2023  Manual therapy Sitting, forward flexed onto mat table   STM to B lumbar paraspinal muscles R > L to decrease tension     Neuromuscular re-education  Seated manually resisted L trunk side bend isometrics in neutral 10x5 seconds for 3 sets  Sitting, forward flexed onto mat table  Transversus abdominis contraction 10x3 with 5 seconds   To help deactivate R lumbar paraspinal muscle tension    Glute max set 10x3 with 5 second holds  No back pain and decreased tightness afterwards  Seated B scapular retraction 10x5 seconds for 2 sets to promote thoracic extension and upright posture  Seated trunk flexion stretch 10x10 seconds      Improved exercise technique, movement at target joints, use of target muscles after mod verbal, visual, tactile cues.      PATIENT EDUCATION:  Education details: There-ex, HEP Person educated: Patient Education method: Explanation, Demonstration, Tactile cues, Verbal cues, and Handouts Education comprehension: verbalized understanding and returned demonstration  HOME EXERCISE PROGRAM: Access Code: [email or web address] URL: [email or web address] Date:  10/09/2023 Prepared by: Emil Glassman  Exercises - Supine Transversus Abdominis Bracing - Hands on Stomach  - 5 x daily - 7 x weekly - 3 sets - 10 reps - 5 seconds hold - Supine Posterior Pelvic Tilt  - 1 x daily - 7 x weekly - 3 sets - 10 reps - 5 seconds  hold  - Seated Gluteal Sets  - 3 x daily - 7 x weekly - 3 sets - 10 reps - 5 seconds hold - Seated Flexion Stretch  - 3 x daily - 7 x weekly - 1 sets - 10 reps - 10 seconds hold  ASSESSMENT:  CLINICAL IMPRESSION:  Worked on decreasing lumbar extension stress, improving transversus abdominis and glute max muscle activation, and decreasing lumbar paraspinal muscle tension to decrease stress to low back. Back feels much better after session reported.  Pt will benefit from skilled physical therapy services to decrease pain, improve strength and function.        OBJECTIVE IMPAIRMENTS: difficulty walking, decreased strength, improper body mechanics, postural dysfunction, and pain.   ACTIVITY LIMITATIONS: carrying, lifting, bending, sitting, standing, stairs, and locomotion level  PARTICIPATION LIMITATIONS:   PERSONAL FACTORS: Age, Fitness, Past/current experiences, Time since onset of injury/illness/exacerbation, and 3+ comorbidities: anxiety, arthritis, depression, hx of parotid gland tumor, seizures  are also affecting patient's functional outcome.   REHAB POTENTIAL: Fair    CLINICAL DECISION MAKING: Stable/uncomplicated  EVALUATION COMPLEXITY: Low   GOALS: Goals reviewed with patient? Yes  SHORT TERM GOALS: Target date: 10/13/2023  Pt will be independent with her initial HEP to decrease pain, improve strength, function, and ability to perform tasks that involve standing, and walking more comfortably for her back.  Baseline:Pt has not yet started her initial HEP (10/02/2023) Goal status: INITIAL   LONG TERM GOALS: Target date: 12/01/2023  Pt will have a decrease in low back pain to 5/10 or less at worst to promote ability to  perform standing and walking tasks as well as negotiate stairs more comfortably.  Baseline: low back pain 10/10 at most for the past 3 months (10/02/2023) Goal status: INITIAL  2.  Pt will have a decrease in L medial thigh pain to 3/10 or less at worst to promote ability to perform standing tasks as well as get into and out of vehicles more comfortably.   Baseline: L inner thigh pain 8/10 at worst for the past 3 months Goal status: INITIAL  3.  Pt will improve  her B hip extension and abduction strength by at least 1/2 MMT grade to promote ability to perform standing tasks as well ambulate more comfortably for her back.  Baseline:  MMT Right eval Left eval  Hip extension 3- 3-  Hip abduction (seated manually resisted) 4 4-   (10/02/2023)   Goal status: INITIAL  4.  Pt will improve her Modified Oswestry Low Back Pain disability questionnaire score by at least 10% as a demonstration of improved function.  Baseline: Modified Oswestry Low Back Pain disability questionnaire 52% (10/02/2023) Goal status: INITIAL    PLAN:  PT FREQUENCY: 1-2x/week  PT DURATION: 8 weeks  PLANNED INTERVENTIONS: 97110-Therapeutic exercises, 97530- Therapeutic activity, [id number]- Neuromuscular re-education, 97535- Self Care, [id number]- Manual therapy, [id number]- Aquatic Therapy, [id number]- Electrical stimulation (unattended), [id number]- Traction (mechanical), [id number]- Ionotophoresis 4mg /ml Dexamethasone , Patient/Family education, Dry Needling, Joint mobilization, and Spinal mobilization.  PLAN FOR NEXT SESSION: posture, scapular, trunk, glute strengthening, lumbopelvic control, manual techniques, modalities PRN   Lahna Nath, PT, DPT 10/11/2023, 12:08 PM

## 2023-10-15 ENCOUNTER — Ambulatory Visit
Admission: EM | Admit: 2023-10-15 | Discharge: 2023-10-15 | Disposition: A | Discharge status: Home / Self Care | Payer: Medicare Other | Attending: Emergency Medicine | Admitting: Emergency Medicine

## 2023-10-15 DIAGNOSIS — J101 Influenza due to other identified influenza virus with other respiratory manifestations: Secondary | ICD-10-CM | POA: Diagnosis not present

## 2023-10-15 LAB — POC COVID19/FLU A&B COMBO
Covid Antigen, POC: NEGATIVE
Influenza A Antigen, POC: POSITIVE — AB
Influenza B Antigen, POC: NEGATIVE

## 2023-10-15 MED ORDER — BENZONATATE 100 MG PO CAPS: 100.0000 mg | ORAL_CAPSULE | Freq: Three times a day (TID) | ORAL | 0 refills | Status: DC | PRN

## 2023-10-15 NOTE — ED Triage Notes (Signed)
 Patient to Urgent Care with complaints of productive (green/ yellow) cough/ possible fevers/ body aches/ runny nose.   Symptoms started three days ago.  Meds: Dayquil.

## 2023-10-15 NOTE — ED Provider Notes (Signed)
 [name]    CSN: [id number] Arrival date & time: 10/15/23  0813      History   Chief Complaint Chief Complaint  Patient presents with   Fever    HPI April Nguyen is a 73 y.o. female.  Patient is on day 3 of subjective fever, body aches, runny nose, cough.  No OTC medications taken today.  She took DayQuil previously.  No shortness of breath, vomiting, diarrhea.  The history is provided by the patient and medical records.    Past Medical History:  Diagnosis Date   Allergy    Seasonal   Anxiety    Arthritis    neck, wrists,hands, toes, and feet   COVID-19 09/08/2021   Depression    Eustachian tube dysfunction    Family history of adverse reaction to anesthesia    brother - PONV and combative   Genital warts    In past   GERD (gastroesophageal reflux disease)    History of shingles 04/16/2015   March 2016    history of Tumor of parotid gland    Hyperlipidemia    Meniere's disease    slight hearing loss, car sickness and dizziness   Migraines    sinus/stress   Monoclonal gammopathy    Polyarthralgia    PONV (postoperative nausea and vomiting)    and headaches and combative   Pre-diabetes    RLS (restless legs syndrome)    Seizures (HCC)    Dx 16 yrs ago - hormonal - none at least 3 yrs   Seizures (HCC)    H/O due to hormone issues    Patient Active Problem List   Diagnosis Date Noted   Chronic fatigue 02/27/2023   Hot flashes 02/27/2023   Monoclonal gammopathy 12/29/2021   Polyarthralgia 11/01/2021   Left foot pain 11/01/2021   Restless legs 03/09/2021   Prediabetes 02/26/2019   Chronic neck pain 02/26/2019   Hormone replacement therapy (HRT) 02/22/2018   Meniere disease 02/22/2018   Preventative health care 02/11/2016   Hyperlipidemia 02/11/2016   Insomnia 01/22/2016   Depression with anxiety 04/16/2015   GERD (gastroesophageal reflux disease) 04/16/2015   Arthritis 04/16/2015   ETD (eustachian tube dysfunction) 04/16/2015     Past Surgical History:  Procedure Laterality Date   ABDOMINAL HYSTERECTOMY     CATARACT EXTRACTION Bilateral 01/2023   CHOLECYSTECTOMY  09/05/2006   dialation and cartarage  1982, 1999, 2000   DISTAL INTERPHALANGEAL JOINT FUSION Left 05/24/2022   Procedure: Left index and ring DIP fusion;  Surgeon: Kathlynn Sharper, MD;  Location: [facility];  Service: Orthopedics;  Laterality: Left;   FINGER SURGERY  2011, 2012   Bone fusion of middle finger right and left hands   FINGER SURGERY  09/06/2011   Pin removed from left middle finger   GANGLION CYST EXCISION  2011, 2012   Left and right hands   MYRINGOTOMY WITH TUBE PLACEMENT Bilateral 05/13/2015   Procedure: MYRINGOTOMY WITH  BUTTERFLY TUBE PLACEMENT;  Surgeon: Carolee Hunter, MD;  Location: [facility];  Service: ENT;  Laterality: Bilateral;   MYRINGOTOMY WITH TUBE PLACEMENT Bilateral 08/28/2019   Procedure: MYRINGOTOMY WITH BUTTERFLY TUBE PLACEMENT;  Surgeon: Hunter Carolee, MD;  Location: [facility];  Service: ENT;  Laterality: Bilateral;   PAROTID GLAND TUMOR EXCISION  09/05/1984   TONSILLECTOMY AND ADENOIDECTOMY  09/05/1957   TUBAL LIGATION  09/05/2008    OB History   No obstetric history on file.      Home Medications  Prior to Admission medications   Medication Sig Start Date End Date Taking? Authorizing Provider  benzonatate  (TESSALON ) 100 MG capsule Take 1 capsule (100 mg total) by mouth 3 (three) times daily as needed for cough. 10/15/23  Yes Corlis Burnard DEL, NP  Ascorbic Acid (VITAMIN C) 1000 MG tablet Take 1,800 mg by mouth daily.    [provider]  celecoxib  (CELEBREX ) 100 MG capsule Take 100 mg by mouth 2 (two) times daily. 100 mg AM, 200 mg at dinner 12/13/21   [provider]  cetirizine  (ZYRTEC ) 10 MG tablet Take 1 tablet (10 mg total) by mouth daily. Patient taking differently: Take 10 mg by mouth daily. am 07/01/19   Lavell Lye A, FNP  Ergocalciferol  (VITAMIN  D2) 10 MCG (400 UNIT) TABS Take 800 Units by mouth daily.    [provider]  estradiol  (ESTRACE ) 0.1 MG/GM vaginal cream APPLY TWO TO THREE TIMES WEEKLY 09/20/23   Clark, Katherine K, NP  famotidine  (PEPCID ) 20 MG tablet TAKE ONE TABLET (20 MG TOTAL) BY MOUTH DAILY. FOR HEARTBURN. 06/30/23   Clark, Katherine K, NP  fluticasone  (FLONASE ) 50 MCG/ACT nasal spray PLACE ONE SPRAY INTO BOTH NOSTRILS TWO (TWO) TIMES DAILY AS NEEDED FOR ALLERGIES OR RHINITIS. 09/26/23   Clark, Katherine K, NP  gabapentin  (NEURONTIN ) 600 MG tablet Take 1 tablet (600 mg total) by mouth at bedtime. For restless legs 04/04/23   Clark, Katherine K, NP  hydrOXYzine  (ATARAX /VISTARIL ) 10 MG tablet Take 1-2 tablets by mouth twice daily as needed for long car rides. Patient not taking: Reported on 07/25/2023 03/09/21   Clark, Katherine K, NP  meclizine  (ANTIVERT ) 25 MG tablet Take 1 tablet (25 mg total) by mouth 3 (three) times daily as needed for dizziness (take as needed not with other antihistimines). Patient not taking: Reported on 07/25/2023 09/08/21   Tower, Laine LABOR, MD  omeprazole  (PRILOSEC) 20 MG capsule TAKE ONE CAPSULE BY MOUTH ONCE DAILY FOR HEARTBURN 06/21/23   Clark, Katherine K, NP  PARoxetine  (PAXIL ) 40 MG tablet Take 1 tablet (40 mg total) by mouth every morning. for anxiety and depression. 04/12/23   Clark, Katherine K, NP  rosuvastatin  (CRESTOR ) 10 MG tablet TAKE ONE TABLET BY MOUTH ONCE A DAY FOR CHOLESTEROL 05/19/23   Clark, Katherine K, NP  tiZANidine (ZANAFLEX) 2 MG tablet Take 2 mg by mouth 2 (two) times daily. 07/08/23   [provider]    Family History Family History  Problem Relation Age of Onset   Arthritis Mother    Hyperlipidemia Mother    Hypertension Mother    Stroke Mother    Heart disease Mother    Diabetes Mother    Hyperlipidemia Father    Heart disease Father    Arthritis Brother    Throat cancer Maternal Grandfather    Breast cancer Neg Hx     Social History Social History    Tobacco Use   Smoking status: Never   Smokeless tobacco: Never  Vaping Use   Vaping status: Never Used  Substance Use Topics   Alcohol use: No    Alcohol/week: 0.0 standard drinks of alcohol   Drug use: No     Allergies   Septra [sulfamethoxazole-trimethoprim]   Review of Systems Review of Systems  Constitutional:  Positive for fever. Negative for chills.  HENT:  Positive for rhinorrhea. Negative for ear pain and sore throat.   Respiratory:  Positive for cough. Negative for shortness of breath.   Gastrointestinal:  Negative for diarrhea and  vomiting.     Physical Exam Triage Vital Signs ED Triage Vitals  Encounter Vitals Group     BP 10/15/23 0830 131/76     Systolic BP Percentile --      Diastolic BP Percentile --      Pulse Rate 10/15/23 0830 100     Resp 10/15/23 0830 18     Temp 10/15/23 0830 99.2 F (37.3 C)     Temp src --      SpO2 10/15/23 0830 95 %     Weight --      Height --      Head Circumference --      Peak Flow --      Pain Score 10/15/23 0837 8     Pain Loc --      Pain Education --      Exclude from Growth Chart --    No data found.  Updated Vital Signs BP 131/76   Pulse 100   Temp 99.2 F (37.3 C)   Resp 18   SpO2 95%   Visual Acuity Right Eye Distance:   Left Eye Distance:   Bilateral Distance:    Right Eye Near:   Left Eye Near:    Bilateral Near:     Physical Exam Constitutional:      General: She is not in acute distress. HENT:     Right Ear: Tympanic membrane normal.     Left Ear: Tympanic membrane normal.     Nose: Rhinorrhea present.     Mouth/Throat:     Mouth: Mucous membranes are moist.     Pharynx: Oropharynx is clear.  Cardiovascular:     Rate and Rhythm: Normal rate and regular rhythm.     Heart sounds: Normal heart sounds.  Pulmonary:     Effort: Pulmonary effort is normal. No respiratory distress.     Breath sounds: Normal breath sounds.  Neurological:     Mental Status: She is alert.      UC  Treatments / Results  Labs (all labs ordered are listed, but only abnormal results are displayed) Labs Reviewed  POC COVID19/FLU A&B COMBO - Abnormal; Notable for the following components:      Result Value   Influenza A Antigen, POC Positive (*)    All other components within normal limits    EKG   Radiology No results found.  Procedures Procedures (including critical care time)  Medications Ordered in UC Medications - No data to display  Initial Impression / Assessment and Plan / UC Course  I have reviewed the triage vital signs and the nursing notes.  Pertinent labs & imaging results that were available during my care of the patient were reviewed by me and considered in my medical decision making (see chart for details).    Influenza A.  Afebrile and vital signs are stable.  Lungs are clear and O2 sat is 95%.  Patient is outside the window for treatment with Tamiflu.  Discussed symptomatic management including Tylenol  as needed.  Tessalon  Perles prescribed for cough.  Instructed patient to follow-up with her PCP tomorrow.  ED precautions given.  Education provided on influenza.  Patient agrees to plan of care.  Final Clinical Impressions(s) / UC Diagnoses   Final diagnoses:  Influenza A     Discharge Instructions      Your flu test is positive for influenza A.  You are outside the window for treatment with flu medication.    Take Tylenol  as  needed for fever or discomfort.  Take the prescribed Tessalon  Perles as needed for cough.    Follow up with your primary care provider tomorrow.  Go to the emergency department if you have worsening symptoms.        ED Prescriptions     Medication Sig Dispense Auth. Provider   benzonatate  (TESSALON ) 100 MG capsule Take 1 capsule (100 mg total) by mouth 3 (three) times daily as needed for cough. 21 capsule Corlis Burnard DEL, NP      PDMP not reviewed this encounter.   Corlis Burnard DEL, NP 10/15/23 [phone]

## 2023-10-15 NOTE — Discharge Instructions (Addendum)
 Your flu test is positive for influenza A.  You are outside the window for treatment with flu medication.    Take Tylenol  as needed for fever or discomfort.  Take the prescribed Tessalon  Perles as needed for cough.    Follow up with your primary care provider tomorrow.  Go to the emergency department if you have worsening symptoms.

## 2023-10-16 ENCOUNTER — Ambulatory Visit (INDEPENDENT_AMBULATORY_CARE_PROVIDER_SITE_OTHER): Payer: Medicare Other | Admitting: Internal Medicine

## 2023-10-16 ENCOUNTER — Ambulatory Visit: Payer: Self-pay | Admitting: Primary Care

## 2023-10-16 ENCOUNTER — Ambulatory Visit: Payer: Medicare Other

## 2023-10-16 VITALS — BP 124/76 | HR 104 | Temp 98.9°F | Ht 64.0 in | Wt 153.0 lb

## 2023-10-16 DIAGNOSIS — J101 Influenza due to other identified influenza virus with other respiratory manifestations: Secondary | ICD-10-CM

## 2023-10-16 MED ORDER — HYDROCOD POLI-CHLORPHE POLI ER 10-8 MG/5ML PO SUER: 5.0000 mL | Freq: Two times a day (BID) | ORAL | 0 refills | Status: DC | PRN

## 2023-10-16 NOTE — Telephone Encounter (Signed)
 Appt with Family Medicine Curt Dover, MD) 10/16/2023 at 4:00 PM

## 2023-10-16 NOTE — Progress Notes (Signed)
 Subjective:    Patient ID: April Nguyen, female    DOB: 04/01/1951, 73 y.o.   MRN: [id number]  HPI Here due to bad cough with current flu infection  Symptoms started 3 days ago--with nagging cough Worse 2 days ago--then that night felt much worse Cough at night was bad--and "wet sounding" Went to urgent care ---flu positive Given benzonatate --not helping Gags with the cough  Using tylenol  Vick's/dayquil no help Mucinex  also not helping No SOB if she isn't coughing  Current Outpatient Medications on File Prior to Visit  Medication Sig Dispense Refill   Ascorbic Acid (VITAMIN C) 1000 MG tablet Take 1,800 mg by mouth daily.     benzonatate  (TESSALON ) 100 MG capsule Take 1 capsule (100 mg total) by mouth 3 (three) times daily as needed for cough. 21 capsule 0   celecoxib  (CELEBREX ) 100 MG capsule Take 100 mg by mouth 2 (two) times daily. 100 mg AM, 200 mg at dinner     cetirizine  (ZYRTEC ) 10 MG tablet Take 1 tablet (10 mg total) by mouth daily. (Patient taking differently: Take 10 mg by mouth daily. am) 30 tablet 11   Ergocalciferol  (VITAMIN D2) 10 MCG (400 UNIT) TABS Take 800 Units by mouth daily.     estradiol  (ESTRACE ) 0.1 MG/GM vaginal cream APPLY TWO TO THREE TIMES WEEKLY 42.5 g 1   famotidine  (PEPCID ) 20 MG tablet TAKE ONE TABLET (20 MG TOTAL) BY MOUTH DAILY. FOR HEARTBURN. 90 tablet 1   fluticasone  (FLONASE ) 50 MCG/ACT nasal spray PLACE ONE SPRAY INTO BOTH NOSTRILS TWO (TWO) TIMES DAILY AS NEEDED FOR ALLERGIES OR RHINITIS. 48 mL 0   gabapentin  (NEURONTIN ) 600 MG tablet Take 1 tablet (600 mg total) by mouth at bedtime. For restless legs 90 tablet 3   hydrOXYzine  (ATARAX /VISTARIL ) 10 MG tablet Take 1-2 tablets by mouth twice daily as needed for long car rides. 30 tablet 0   meclizine  (ANTIVERT ) 25 MG tablet Take 1 tablet (25 mg total) by mouth 3 (three) times daily as needed for dizziness (take as needed not with other antihistimines). 30 tablet 0   omeprazole  (PRILOSEC) 20 MG  capsule TAKE ONE CAPSULE BY MOUTH ONCE DAILY FOR HEARTBURN 90 capsule 2   PARoxetine  (PAXIL ) 40 MG tablet Take 1 tablet (40 mg total) by mouth every morning. for anxiety and depression. 90 tablet 3   rosuvastatin  (CRESTOR ) 10 MG tablet TAKE ONE TABLET BY MOUTH ONCE A DAY FOR CHOLESTEROL 90 tablet 2   tiZANidine (ZANAFLEX) 2 MG tablet Take 2 mg by mouth 2 (two) times daily.     No current facility-administered medications on file prior to visit.    Allergies  Allergen Reactions   Septra [Sulfamethoxazole-Trimethoprim] Rash    Past Medical History:  Diagnosis Date   Allergy    Seasonal   Anxiety    Arthritis    neck, wrists,hands, toes, and feet   COVID-19 09/08/2021   Depression    Eustachian tube dysfunction    Family history of adverse reaction to anesthesia    brother - PONV and combative   Genital warts    In past   GERD (gastroesophageal reflux disease)    History of shingles 04/16/2015   March 2016    history of Tumor of parotid gland    Hyperlipidemia    Meniere's disease    slight hearing loss, car sickness and dizziness   Migraines    sinus/stress   Monoclonal gammopathy    Polyarthralgia    PONV (postoperative  nausea and vomiting)    and headaches and combative   Pre-diabetes    RLS (restless legs syndrome)    Seizures (HCC)    Dx 16 yrs ago - hormonal - none at least 3 yrs   Seizures (HCC)    H/O due to hormone issues    Past Surgical History:  Procedure Laterality Date   ABDOMINAL HYSTERECTOMY     CATARACT EXTRACTION Bilateral 01/2023   CHOLECYSTECTOMY  09/05/2006   dialation and cartarage  1982, 1999, 2000   DISTAL INTERPHALANGEAL JOINT FUSION Left 05/24/2022   Procedure: Left index and ring DIP fusion;  Surgeon: Molli Angelucci, MD;  Location: [facility];  Service: Orthopedics;  Laterality: Left;   FINGER SURGERY  2011, 2012   Bone fusion of middle finger right and left hands   FINGER SURGERY  09/06/2011   Pin removed from left middle  finger   GANGLION CYST EXCISION  2011, 2012   Left and right hands   MYRINGOTOMY WITH TUBE PLACEMENT Bilateral 05/13/2015   Procedure: MYRINGOTOMY WITH  BUTTERFLY TUBE PLACEMENT;  Surgeon: Rogers Clayman, MD;  Location: [facility];  Service: ENT;  Laterality: Bilateral;   MYRINGOTOMY WITH TUBE PLACEMENT Bilateral 08/28/2019   Procedure: MYRINGOTOMY WITH BUTTERFLY TUBE PLACEMENT;  Surgeon: Rogers Clayman, MD;  Location: [facility];  Service: ENT;  Laterality: Bilateral;   PAROTID GLAND TUMOR EXCISION  09/05/1984   TONSILLECTOMY AND ADENOIDECTOMY  09/05/1957   TUBAL LIGATION  09/05/2008    Family History  Problem Relation Age of Onset   Arthritis Mother    Hyperlipidemia Mother    Hypertension Mother    Stroke Mother    Heart disease Mother    Diabetes Mother    Hyperlipidemia Father    Heart disease Father    Arthritis Brother    Throat cancer Maternal Grandfather    Breast cancer Neg Hx     Social History   Socioeconomic History   Marital status: Married    Spouse name: Not on file   Number of children: Not on file   Years of education: Not on file   Highest education level: Associate degree: occupational, Scientist, product/process development, or vocational program  Occupational History   Not on file  Tobacco Use   Smoking status: Never   Smokeless tobacco: Never  Vaping Use   Vaping status: Never Used  Substance and Sexual Activity   Alcohol use: No    Alcohol/week: 0.0 standard drinks of alcohol   Drug use: No   Sexual activity: Yes    Partners: Male    Comment: Husband  Other Topics Concern   Not on file  Social History Narrative   Work at the [employer] at [employer].  Lives with husband with [place]. Children- daughter and son;no smoking or alcohol.      -------------------------------------------------------------------------------    Grandchildren- 6    Caffeine- 1 cup in the morning, soda occasionally, green tea hot/cold   Enjoys reading,  spending time at beach, spending time with family.   Social Drivers of Corporate investment banker Strain: Low Risk  (10/16/2023)   Overall Financial Resource Strain (CARDIA)    Difficulty of Paying Living Expenses: Not hard at all  Food Insecurity: No Food Insecurity (10/16/2023)   Hunger Vital Sign    Worried About Running Out of Food in the Last Year: Never true    Ran Out of Food in the Last Year: Never true  Transportation Needs: No Transportation Needs (10/16/2023)  PRAPARE - Administrator, Civil Service (Medical): No    Lack of Transportation (Non-Medical): No  Physical Activity: Unknown (10/16/2023)   Exercise Vital Sign    Days of Exercise per Week: Patient declined    Minutes of Exercise per Session: 20 min  Stress: Patient Declined (10/16/2023)   [employer] of Occupational Health - Occupational Stress Questionnaire    Feeling of Stress : Patient declined  Social Connections: Unknown (10/16/2023)   Social Connection and Isolation Panel [NHANES]    Frequency of Communication with Friends and Family: Patient declined    Frequency of Social Gatherings with Friends and Family: Patient declined    Attends Religious Services: Patient declined    Database administrator or Organizations: Patient declined    Attends Banker Meetings: Never    Marital Status: Married  Catering manager Violence: Not At Risk (02/08/2023)   Humiliation, Afraid, Rape, and Kick questionnaire    Fear of Current or Ex-Partner: No    Emotionally Abused: No    Physically Abused: No    Sexually Abused: No   Review of Systems Some nausea with the cough---no vomiting No diarrhea Appetite off--able to eat     Objective:   Physical Exam Constitutional:      Appearance: Normal appearance.     Comments: Cough paroxysms--some gagging  HENT:     Right Ear: Tympanic membrane and ear canal normal.     Left Ear: Tympanic membrane and ear canal normal.     Mouth/Throat:      Pharynx: No oropharyngeal exudate or posterior oropharyngeal erythema.  Pulmonary:     Effort: Pulmonary effort is normal.     Breath sounds: Normal breath sounds. No wheezing or rales.  Musculoskeletal:     Cervical back: Neck supple.  Lymphadenopathy:     Cervical: No cervical adenopathy.  Neurological:     Mental Status: She is alert.            Assessment & Plan:

## 2023-10-16 NOTE — Assessment & Plan Note (Signed)
 Outside of tamiflu window Had vaccine--so not that bad with systemic symptoms--but cough is bad Discussed DM/guaifenisin Tussionex for prn

## 2023-10-16 NOTE — Telephone Encounter (Signed)
  Chief Complaint: worsening cough- Flu positive as of yesterday Symptoms: cough, wheezing, sweating, runny nose Frequency: 4 days Pertinent Negatives: Patient denies SOB, CP, NVD Disposition: [] ED /[] Urgent Care (no appt availability in office) / [x] Appointment(In office/virtual)/ []  [facility]/ [] Home Care/ [] Refused Recommended Disposition /[] [facility]/ []  Follow-up with PCP Additional Notes: Patient called reporting constant hacking cough with intermittent wheezing x 4 days. States this morning it is causing her to gag. Per protocol, patient to be evaluated within 4 hours. First available appointment with PCP is 2/11. Patient scheduled with first available provider in clinic for today at 1600. Offered earlier virtual visit for today at 1015, patient declined requesting F2F. Care advice reviewed, patient verbalized understanding and denies further questions at this time. Alerting PCP for review.    Copied from CRM [id number]. Topic: Clinical - Red Word Triage >> Oct 16, 2023  8:40 AM Kita Perish H wrote: [facility] that prompted transfer to Nurse Triage: Patient was diagnosed with Flu yesterday and given benzonatate  (TESSALON ) 100 MG capsule for cough and it's not working. Cough getting worse, now she's gagging and almost vomiting when she coughs. Reason for Disposition  Wheezing is present  Answer Assessment - Initial Assessment Questions 1. ONSET: "When did the cough begin?"      4 days 2. SEVERITY: "How bad is the cough today?"      Dry hacking cough during the day, makes her gag and want to throw up. 3. SPUTUM: "Describe the color of your sputum" (none, dry cough; clear, white, yellow, green)     Yellow green, minimal 4. HEMOPTYSIS: "Are you coughing up any blood?" If so ask: "How much?" (flecks, streaks, tablespoons, etc.)     Denies 5. DIFFICULTY BREATHING: "Are you having difficulty breathing?" If Yes, ask: "How bad is it?" (e.g., mild, moderate, severe)    -  MILD: No SOB at rest, mild SOB with walking, speaks normally in sentences, can lie down, no retractions, pulse < 100.    - MODERATE: SOB at rest, SOB with minimal exertion and prefers to sit, cannot lie down flat, speaks in phrases, mild retractions, audible wheezing, pulse 100-120.    - SEVERE: Very SOB at rest, speaks in single words, struggling to breathe, sitting hunched forward, retractions, pulse > 120      Denies 6. FEVER: "Do you have a fever?" If Yes, ask: "What is your temperature, how was it measured, and when did it start?"     Yes, over the weekend, but has not checked today. 7. CARDIAC HISTORY: "Do you have any history of heart disease?" (e.g., heart attack, congestive heart failure)      Denies 8. LUNG HISTORY: "Do you have any history of lung disease?"  (e.g., pulmonary embolus, asthma, emphysema)     Denies 9. PE RISK FACTORS: "Do you have a history of blood clots?" (or: recent major surgery, recent prolonged travel, bedridden)     Denies 10. OTHER SYMPTOMS: "Do you have any other symptoms?" (e.g., runny nose, wheezing, chest pain)       Runny nose, sweating, ear fullness, wheezing  12. TRAVEL: "Have you traveled out of the country in the last month?" (e.g., travel history, exposures)       Denies  Protocols used: Cough - Acute Non-Productive-A-AH

## 2023-10-16 NOTE — Telephone Encounter (Signed)
 Okay---I will assess her at the OV today

## 2023-10-18 ENCOUNTER — Ambulatory Visit: Payer: Medicare Other

## 2023-10-23 ENCOUNTER — Ambulatory Visit: Payer: Medicare Other

## 2023-10-23 DIAGNOSIS — M5416 Radiculopathy, lumbar region: Secondary | ICD-10-CM | POA: Diagnosis not present

## 2023-10-23 DIAGNOSIS — M5459 Other low back pain: Secondary | ICD-10-CM | POA: Diagnosis not present

## 2023-10-23 NOTE — Therapy (Signed)
OUTPATIENT PHYSICAL THERAPY TREATMENT   Patient Name: April Nguyen MRN: [id number] DOB:25-Jun-1951, 73 y.o., female Today's Date: 10/23/2023  END OF SESSION:  PT End of Session - 10/23/23 0950     Visit Number 4    Number of Visits 17    Date for PT Re-Evaluation 12/01/23    PT Start Time 0950    PT Stop Time 1031    PT Time Calculation (min) 41 min    Activity Tolerance Patient tolerated treatment well    Behavior During Therapy [facility] for tasks assessed/performed                Past Medical History:  Diagnosis Date   Allergy    Seasonal   Anxiety    Arthritis    neck, wrists,hands, toes, and feet   COVID-19 09/08/2021   Depression    Eustachian tube dysfunction    Family history of adverse reaction to anesthesia    brother - PONV and combative   Genital warts    In past   GERD (gastroesophageal reflux disease)    History of shingles 04/16/2015   March 2016    history of Tumor of parotid gland    Hyperlipidemia    Meniere's disease    slight hearing loss, car sickness and dizziness   Migraines    sinus/stress   Monoclonal gammopathy    Polyarthralgia    PONV (postoperative nausea and vomiting)    and headaches and combative   Pre-diabetes    RLS (restless legs syndrome)    Seizures (HCC)    Dx 16 yrs ago - hormonal - none at least 3 yrs   Seizures (HCC)    H/O due to hormone issues   Past Surgical History:  Procedure Laterality Date   ABDOMINAL HYSTERECTOMY     CATARACT EXTRACTION Bilateral 01/2023   CHOLECYSTECTOMY  09/05/2006   dialation and cartarage  1982, 1999, 2000   DISTAL INTERPHALANGEAL JOINT FUSION Left 05/24/2022   Procedure: Left index and ring DIP fusion;  Surgeon: Kennedy Bucker, MD;  Location: [facility];  Service: Orthopedics;  Laterality: Left;   FINGER SURGERY  2011, 2012   Bone fusion of middle finger right and left hands   FINGER SURGERY  09/06/2011   Pin removed from left middle finger   GANGLION CYST EXCISION   2011, 2012   Left and right hands   MYRINGOTOMY WITH TUBE PLACEMENT Bilateral 05/13/2015   Procedure: MYRINGOTOMY WITH  BUTTERFLY TUBE PLACEMENT;  Surgeon: Bud Face, MD;  Location: [facility];  Service: ENT;  Laterality: Bilateral;   MYRINGOTOMY WITH TUBE PLACEMENT Bilateral 08/28/2019   Procedure: MYRINGOTOMY WITH BUTTERFLY TUBE PLACEMENT;  Surgeon: Bud Face, MD;  Location: [facility];  Service: ENT;  Laterality: Bilateral;   PAROTID GLAND TUMOR EXCISION  09/05/1984   TONSILLECTOMY AND ADENOIDECTOMY  09/05/1957   TUBAL LIGATION  09/05/2008   Patient Active Problem List   Diagnosis Date Noted   Influenza A 10/16/2023   Chronic fatigue 02/27/2023   Hot flashes 02/27/2023   Monoclonal gammopathy 12/29/2021   Polyarthralgia 11/01/2021   Left foot pain 11/01/2021   Restless legs 03/09/2021   Prediabetes 02/26/2019   Chronic neck pain 02/26/2019   Hormone replacement therapy (HRT) 02/22/2018   Meniere disease 02/22/2018   Preventative health care 02/11/2016   Hyperlipidemia 02/11/2016   Insomnia 01/22/2016   Depression with anxiety 04/16/2015   GERD (gastroesophageal reflux disease) 04/16/2015   Arthritis 04/16/2015   ETD (eustachian  tube dysfunction) 04/16/2015    PCP: Doreene Nest, NP   REFERRING PROVIDER: Venetia Night, MD   REFERRING DIAG: [phone] (ICD-10-CM) - Neurogenic claudication due to lumbar spinal stenosis  Rationale for Evaluation and Treatment: Rehabilitation  THERAPY DIAG:  Other low back pain  Radiculopathy, lumbar region  ONSET DATE: a year ago  SUBJECTIVE:                                                                                                                                                                                           SUBJECTIVE STATEMENT: Low back is ok. A little pain, maybe a 4-5/10 currently. Got the flu last week and was very sick, was not able to do her HEP. The L inner thigh pain  is better. Still has numbness L medial foot.       PERTINENT HISTORY:  Low back pain with neurogenic claudication. Has L LE symptoms. She also has L UE pain. Low back pain began about a year ago. Had an MRI for neck and low back. Was referred to Dr. Myer Haff who said that she had a pinched nerve in low back. Had 2 steroid injections for low back, the second injection (December 2024) helped. Has L R and L hip (greater trochanter area bilaterally). Had a steroid injection for L hip 3 months ago which helped.    No hx of osteoporosis No HTN per pt.  No latex allergies   PAIN:  Are you having pain? Yes: NPRS scale: 6/10 Pain location: low back and L inner thigh Pain description: achy, stiff Aggravating factors: stair negotiation, standing (for about 15-20 minutes), walking about 30 minutes in the grocery store, but sometimes a few minutes, sitting (for about 30 minutes) Relieving factors: heating pad , using a shopping cart, sitting in recliner with L leg hanging off to the side.   PRECAUTIONS: None  RED FLAGS: Bowel or bladder incontinence: Yes: both, and both Dr. Myer Haff (does not think it is due to her low back) and Dr. Mariah Milling aware and Cauda equina syndrome: Yes: L thigh tingling.     WEIGHT BEARING RESTRICTIONS: No  FALLS:  Has patient fallen in last 6 months? No  LIVING ENVIRONMENT: Lives with: lives with their spouse Lives in: House/apartment Stairs: Yes: External: 2 steps; none (side of the deck) Has following equipment at home: Grab bars  OCCUPATION: retired   PLOF: Independent  PATIENT GOALS: be able to mop the floor, go to sleep without tossing and turning, be able to walk and not hurt  NEXT MD VISIT: Not yet.  OBJECTIVE:  Note: Objective measures were completed at Evaluation unless  otherwise noted.  DIAGNOSTIC FINDINGS:  MR LUMBAR SPINE WO CONTRAST 06/24/2023  Narrative & Impression  CLINICAL DATA:  Cervicalgia.   Chronic bilateral low back pain with  left-sided sciatica.   EXAM: MRI CERVICAL AND LUMBAR SPINE WITHOUT CONTRAST   TECHNIQUE: Multiplanar and multiecho pulse sequences of the cervical spine, to include the craniocervical junction and cervicothoracic junction, and lumbar spine, were obtained without intravenous contrast.   COMPARISON:  Radiographs cervical spine February 26, 2019. CT cervical spine November 21, 2011.   No prior study of the lumbar spine.   FINDINGS: MRI CERVICAL SPINE FINDINGS   Alignment: Small anterolisthesis small anterolisthesis at C3-4, C4-5, C5-6 and C7-T1, similar to prior radiograph.   Vertebrae: No fracture, evidence of discitis, or bone lesion.   Cord: Normal signal and morphology.   Posterior Fossa, vertebral arteries, paraspinal tissues: A 3 cm right thyroid lobe nodule.   Disc levels:   C1-2: Left-sided joint effusion.   C2-3: Facet degenerative changes. No significant spinal canal or neural foraminal stenosis.   C3-4: Mild disc bulge without significant spinal canal stenosis. Uncovertebral and facet degenerative change resulting in mild bilateral neural foraminal narrowing.   C4-5: Small posterior disc protrusion without significant spinal canal stenosis. Uncovertebral and facet degenerative changes resulting in moderate bilateral neural foraminal narrowing.   C5-6: Small posterior disc osteophyte complex without significant spinal canal stenosis. Uncovertebral and prominent hypertrophic facet degenerative changes resulting in mild right neural foraminal narrowing.   C6-7: Posterior disc osteophyte complex without significant spinal canal stenosis. Uncovertebral and facet degenerative change resulting in mild right and moderate to severe left neural foraminal narrowing.   C7-T1: Facet degenerative changes resulting in mild-to-moderate left neural foraminal narrowing. No spinal canal stenosis.   MRI LUMBAR SPINE FINDINGS   Segmentation:  Standard segmentation is assumed.    Alignment:  Dextroconvex curvature.   Vertebrae: No acute fracture, evidence of discitis, or bone lesion. Chronic compression fractures of the L3 endplates with associated Schmorl nodes. Endplate degenerative changes throughout the lumbar spine, more pronounced at L4-5.   Conus medullaris and cauda equina: Conus extends to the L1 level. Conus and cauda equina appear normal.   Paraspinal and other soft tissues: Negative.   Disc levels:   T12-L1: No spinal canal or neural foraminal stenosis.   L1-2: Disc bulge with superiorly migrating left central disc extrusion and mild facet degenerative changes. Mild spinal canal stenosis with mild narrowing of the left subarticular zone and mild bilateral neural foraminal narrowing   L2-3: Loss of disc height, left asymmetric disc bulge, moderate facet degenerative changes and ligamentum flavum redundancy resulting in moderate spinal canal stenosis with moderate right and severe left subarticular zone stenosis and moderate bilateral neural foraminal narrowing.   L3-4: Loss of disc height, disc bulge, moderate facet degenerative changes and ligamentum flavum redundancy resulting mild spinal canal stenosis with moderate narrowing of the bilateral subarticular zones, right greater than left, moderate right and mild left neural foraminal narrowing.   L4-5: Loss of disc height, disc bulge, moderate hypertrophic facet degenerative changes and ligamentum flavum redundancy resulting in mild-to-moderate spinal canal stenosis with moderate narrowing of the bilateral subarticular zones and moderate bilateral neural foraminal narrowing.   L5-S1: Shallow disc bulge and moderate facet degenerative changes without significant spinal canal or neural foraminal stenosis.   IMPRESSION: 1. Degenerative changes of the cervical spine without high-grade spinal canal stenosis at any level. 2. Moderate bilateral neural foraminal narrowing at C4-5. 3. Mild  right and moderate to severe  left neural foraminal narrowing at C6-7. 4. Mild-to-moderate left neural foraminal narrowing at C7-T1. 5. Moderate spinal canal stenosis with moderate right and severe left subarticular zone stenosis and moderate bilateral neural foraminal narrowing at L2-3. 6. Mild spinal canal stenosis with moderate narrowing of the bilateral subarticular zones, moderate right and mild left neural foraminal narrowing at L3-4. 7. Mild-to-moderate spinal canal stenosis with moderate narrowing of the bilateral subarticular zones and moderate bilateral neural foraminal narrowing at L4-5. 8. A 3 cm right thyroid lobe nodule. Recommend thyroid ultrasound for further evaluation.     Electronically Signed   By: Baldemar Lenis M.D.   On: 07/11/2023 10:15       PATIENT SURVEYS:  Modified Oswestry 52% (10/02/2023)   COGNITION: Overall cognitive status: Within functional limits for tasks assessed     SENSATION: Not tested    POSTURE: forward neck, B protracted shouldres, R knee slightly higher, R hip in ER, R lumbar convexity, forward flexed, decreased B hip extension, R lateral shift  Decreased low back pain with R lateral shift correction  PALPATION: TTP L greater trochanter L > R   B lumbar paraspinal muscle tenision R > L  TTP low back R > L L5 > L4 > L3 > L2  LUMBAR ROM:   AROM eval  Flexion WFL  Extension Limtied with L low back pain and L L5 dermatome symptoms to L lateral foot.   Right lateral flexion WFL with low back discomfort  Left lateral flexion WFL with low back discomfort  Right rotation full  Left rotation Limited with L L5 pain to foot   (Blank rows = not tested)  LOWER EXTREMITY ROM:     Passive  Right eval Left eval  Hip flexion    Hip extension    Hip abduction    Hip adduction    Hip internal rotation    Hip external rotation    Knee flexion    Knee extension    Ankle dorsiflexion    Ankle plantarflexion     Ankle inversion    Ankle eversion     (Blank rows = not tested)  LOWER EXTREMITY MMT:    MMT Right eval Left eval  Hip flexion 4 (with low back pain) 4- with L lateral hip pain  Hip extension 3- 3-  Hip abduction (seated manually resisted) 4 4-  Hip adduction    Hip internal rotation    Hip external rotation    Knee flexion 5 4  Knee extension 5 4+ with L L5 dermatome pain  Ankle dorsiflexion    Ankle plantarflexion    Ankle inversion    Ankle eversion     (Blank rows = not tested)  LUMBAR SPECIAL TESTS:  (+) repeated flexion test.   FUNCTIONAL TESTS:    GAIT: Distance walked: 30 ft Assistive device utilized: None Level of assistance: Complete Independence Comments: R lateral lean, decreased stance R LE, antalgic R LE  TREATMENT DATE: 10/23/2023  Manual therapy  Sitting, forward flexed onto mat table   STM to B lumbar paraspinal muscles R > L to decrease tension     Neuromuscular re-education  Sitting, forward flexed onto mat table  Transversus abdominis contraction 10x3 with 5 seconds   To help deactivate R lumbar paraspinal muscle tension    Glute max set 10x3 with 5 second holds  Seated manually resisted L trunk side bend isometrics in neutral 10x5 seconds for 3 sets  Improved technique, movement at target joints, use of target muscles after mod verbal, visual, tactile cues.     Therapeutic exercise Forward step up onto first regular step with B UE assist   L 10x2   Then with contralateral UE assist     L 10x.   No L medial foot numbness after aforementioned exercise    R 2x. R posterior lateral hip (greater trochanter area discomfort)  Standing, bent over onto elevated mat table   Glute max extension   R 10x   L 10x     Improved exercise technique, movement at target joints, use of target muscles after mod verbal,  visual, tactile cues.      PATIENT EDUCATION:  Education details: There-ex, HEP Person educated: Patient Education method: Explanation, Demonstration, Tactile cues, Verbal cues, and Handouts Education comprehension: verbalized understanding and returned demonstration  HOME EXERCISE PROGRAM: Access Code: [email or web address] URL: [email or web address] Date: 10/09/2023 Prepared by: Loralyn Freshwater  Exercises - Supine Transversus Abdominis Bracing - Hands on Stomach  - 5 x daily - 7 x weekly - 3 sets - 10 reps - 5 seconds hold - Supine Posterior Pelvic Tilt  - 1 x daily - 7 x weekly - 3 sets - 10 reps - 5 seconds  hold  - Seated Gluteal Sets  - 3 x daily - 7 x weekly - 3 sets - 10 reps - 5 seconds hold - Seated Flexion Stretch  - 3 x daily - 7 x weekly - 1 sets - 10 reps - 10 seconds hold  ASSESSMENT:  CLINICAL IMPRESSION: Improvig over all L medial thigh pain based on subjective reports. Worked on improving posture, trunk and glute max strength to decrease extension stress to her low back. No L medial foot pareasthesia after session reported. Pt will benefit from skilled physical therapy services to decrease pain, improve strength and function.        OBJECTIVE IMPAIRMENTS: difficulty walking, decreased strength, improper body mechanics, postural dysfunction, and pain.   ACTIVITY LIMITATIONS: carrying, lifting, bending, sitting, standing, stairs, and locomotion level  PARTICIPATION LIMITATIONS:   PERSONAL FACTORS: Age, Fitness, Past/current experiences, Time since onset of injury/illness/exacerbation, and 3+ comorbidities: anxiety, arthritis, depression, hx of parotid gland tumor, seizures  are also affecting patient's functional outcome.   REHAB POTENTIAL: Fair    CLINICAL DECISION MAKING: Stable/uncomplicated  EVALUATION COMPLEXITY: Low   GOALS: Goals reviewed with patient? Yes  SHORT TERM GOALS: Target date: 10/13/2023  Pt will be independent with her initial HEP to  decrease pain, improve strength, function, and ability to perform tasks that involve standing, and walking more comfortably for her back.  Baseline:Pt has not yet started her initial HEP (10/02/2023) Goal status: INITIAL   LONG TERM GOALS: Target date: 12/01/2023  Pt will have a decrease in low back pain to 5/10 or less at worst to promote ability to perform standing and walking tasks as well as negotiate stairs more comfortably.  Baseline: low back pain 10/10 at most for the past 3 months (10/02/2023)  Goal status: INITIAL  2.  Pt will have a decrease in L medial thigh pain to 3/10 or less at worst to promote ability to perform standing tasks as well as get into and out of vehicles more comfortably.   Baseline: L inner thigh pain 8/10 at worst for the past 3 months Goal status: INITIAL  3.  Pt will improve her B hip extension and abduction strength by at least 1/2 MMT grade to promote ability to perform standing tasks as well ambulate more comfortably for her back.  Baseline:  MMT Right eval Left eval  Hip extension 3- 3-  Hip abduction (seated manually resisted) 4 4-   (10/02/2023)   Goal status: INITIAL  4.  Pt will improve her Modified Oswestry Low Back Pain disability questionnaire score by at least 10% as a demonstration of improved function.  Baseline: Modified Oswestry Low Back Pain disability questionnaire 52% (10/02/2023) Goal status: INITIAL    PLAN:  PT FREQUENCY: 1-2x/week  PT DURATION: 8 weeks  PLANNED INTERVENTIONS: 97110-Therapeutic exercises, 97530- Therapeutic activity, 97112- Neuromuscular re-education, 97535- Self Care, [id number]- Manual therapy, [id number]- Aquatic Therapy, [id number]- Electrical stimulation (unattended), [id number]- Traction (mechanical), [id number]- Ionotophoresis 4mg /ml Dexamethasone, Patient/Family education, Dry Needling, Joint mobilization, and Spinal mobilization.  PLAN FOR NEXT SESSION: posture, scapular, trunk, glute strengthening, lumbopelvic control, manual  techniques, modalities PRN   Tempest Frankland, PT, DPT 10/23/2023, 2:06 PM

## 2023-10-25 ENCOUNTER — Ambulatory Visit: Payer: Medicare Other

## 2023-10-25 DIAGNOSIS — M5459 Other low back pain: Secondary | ICD-10-CM

## 2023-10-25 DIAGNOSIS — M5416 Radiculopathy, lumbar region: Secondary | ICD-10-CM

## 2023-10-25 NOTE — Therapy (Signed)
OUTPATIENT PHYSICAL THERAPY TREATMENT   Patient Name: April Nguyen MRN: [id number] DOB:22-Dec-1950, 73 y.o., female Today's Date: 10/25/2023  END OF SESSION:  PT End of Session - 10/25/23 0905     Visit Number 5    Number of Visits 17    Date for PT Re-Evaluation 12/01/23    PT Start Time 0905    PT Stop Time 0945    PT Time Calculation (min) 40 min    Activity Tolerance Patient tolerated treatment well    Behavior During Therapy [facility] for tasks assessed/performed                 Past Medical History:  Diagnosis Date   Allergy    Seasonal   Anxiety    Arthritis    neck, wrists,hands, toes, and feet   COVID-19 09/08/2021   Depression    Eustachian tube dysfunction    Family history of adverse reaction to anesthesia    brother - PONV and combative   Genital warts    In past   GERD (gastroesophageal reflux disease)    History of shingles 04/16/2015   March 2016    history of Tumor of parotid gland    Hyperlipidemia    Meniere's disease    slight hearing loss, car sickness and dizziness   Migraines    sinus/stress   Monoclonal gammopathy    Polyarthralgia    PONV (postoperative nausea and vomiting)    and headaches and combative   Pre-diabetes    RLS (restless legs syndrome)    Seizures (HCC)    Dx 16 yrs ago - hormonal - none at least 3 yrs   Seizures (HCC)    H/O due to hormone issues   Past Surgical History:  Procedure Laterality Date   ABDOMINAL HYSTERECTOMY     CATARACT EXTRACTION Bilateral 01/2023   CHOLECYSTECTOMY  09/05/2006   dialation and cartarage  1982, 1999, 2000   DISTAL INTERPHALANGEAL JOINT FUSION Left 05/24/2022   Procedure: Left index and ring DIP fusion;  Surgeon: Kennedy Bucker, MD;  Location: [facility];  Service: Orthopedics;  Laterality: Left;   FINGER SURGERY  2011, 2012   Bone fusion of middle finger right and left hands   FINGER SURGERY  09/06/2011   Pin removed from left middle finger   GANGLION CYST EXCISION   2011, 2012   Left and right hands   MYRINGOTOMY WITH TUBE PLACEMENT Bilateral 05/13/2015   Procedure: MYRINGOTOMY WITH  BUTTERFLY TUBE PLACEMENT;  Surgeon: Bud Face, MD;  Location: [facility];  Service: ENT;  Laterality: Bilateral;   MYRINGOTOMY WITH TUBE PLACEMENT Bilateral 08/28/2019   Procedure: MYRINGOTOMY WITH BUTTERFLY TUBE PLACEMENT;  Surgeon: Bud Face, MD;  Location: [facility];  Service: ENT;  Laterality: Bilateral;   PAROTID GLAND TUMOR EXCISION  09/05/1984   TONSILLECTOMY AND ADENOIDECTOMY  09/05/1957   TUBAL LIGATION  09/05/2008   Patient Active Problem List   Diagnosis Date Noted   Influenza A 10/16/2023   Chronic fatigue 02/27/2023   Hot flashes 02/27/2023   Monoclonal gammopathy 12/29/2021   Polyarthralgia 11/01/2021   Left foot pain 11/01/2021   Restless legs 03/09/2021   Prediabetes 02/26/2019   Chronic neck pain 02/26/2019   Hormone replacement therapy (HRT) 02/22/2018   Meniere disease 02/22/2018   Preventative health care 02/11/2016   Hyperlipidemia 02/11/2016   Insomnia 01/22/2016   Depression with anxiety 04/16/2015   GERD (gastroesophageal reflux disease) 04/16/2015   Arthritis 04/16/2015   ETD (  eustachian tube dysfunction) 04/16/2015    PCP: Doreene Nest, NP   REFERRING PROVIDER: Venetia Night, MD   REFERRING DIAG: [phone] (ICD-10-CM) - Neurogenic claudication due to lumbar spinal stenosis  Rationale for Evaluation and Treatment: Rehabilitation  THERAPY DIAG:  Other low back pain  Radiculopathy, lumbar region  ONSET DATE: a year ago  SUBJECTIVE:                                                                                                                                                                                           SUBJECTIVE STATEMENT: Low back is ok. Pain is about a 4/10 currently. Had a tiny bit L medial thigh pain yesterday, ok today. No L foot numbness currently. Can tell that  she worked her muscles after last session.         PERTINENT HISTORY:  Low back pain with neurogenic claudication. Has L LE symptoms. She also has L UE pain. Low back pain began about a year ago. Had an MRI for neck and low back. Was referred to Dr. Myer Haff who said that she had a pinched nerve in low back. Had 2 steroid injections for low back, the second injection (December 2024) helped. Has L R and L hip (greater trochanter area bilaterally). Had a steroid injection for L hip 3 months ago which helped.    No hx of osteoporosis No HTN per pt.  No latex allergies   PAIN:  Are you having pain? Yes: NPRS scale: 6/10 Pain location: low back and L inner thigh Pain description: achy, stiff Aggravating factors: stair negotiation, standing (for about 15-20 minutes), walking about 30 minutes in the grocery store, but sometimes a few minutes, sitting (for about 30 minutes) Relieving factors: heating pad , using a shopping cart, sitting in recliner with L leg hanging off to the side.   PRECAUTIONS: None  RED FLAGS: Bowel or bladder incontinence: Yes: both, and both Dr. Myer Haff (does not think it is due to her low back) and Dr. Mariah Milling aware and Cauda equina syndrome: Yes: L thigh tingling.     WEIGHT BEARING RESTRICTIONS: No  FALLS:  Has patient fallen in last 6 months? No  LIVING ENVIRONMENT: Lives with: lives with their spouse Lives in: House/apartment Stairs: Yes: External: 2 steps; none (side of the deck) Has following equipment at home: Grab bars  OCCUPATION: retired   PLOF: Independent  PATIENT GOALS: be able to mop the floor, go to sleep without tossing and turning, be able to walk and not hurt  NEXT MD VISIT: Not yet.  OBJECTIVE:  Note: Objective measures were completed at Evaluation unless otherwise  noted.  DIAGNOSTIC FINDINGS:  MR LUMBAR SPINE WO CONTRAST 06/24/2023  Narrative & Impression  CLINICAL DATA:  Cervicalgia.   Chronic bilateral low back pain  with left-sided sciatica.   EXAM: MRI CERVICAL AND LUMBAR SPINE WITHOUT CONTRAST   TECHNIQUE: Multiplanar and multiecho pulse sequences of the cervical spine, to include the craniocervical junction and cervicothoracic junction, and lumbar spine, were obtained without intravenous contrast.   COMPARISON:  Radiographs cervical spine February 26, 2019. CT cervical spine November 21, 2011.   No prior study of the lumbar spine.   FINDINGS: MRI CERVICAL SPINE FINDINGS   Alignment: Small anterolisthesis small anterolisthesis at C3-4, C4-5, C5-6 and C7-T1, similar to prior radiograph.   Vertebrae: No fracture, evidence of discitis, or bone lesion.   Cord: Normal signal and morphology.   Posterior Fossa, vertebral arteries, paraspinal tissues: A 3 cm right thyroid lobe nodule.   Disc levels:   C1-2: Left-sided joint effusion.   C2-3: Facet degenerative changes. No significant spinal canal or neural foraminal stenosis.   C3-4: Mild disc bulge without significant spinal canal stenosis. Uncovertebral and facet degenerative change resulting in mild bilateral neural foraminal narrowing.   C4-5: Small posterior disc protrusion without significant spinal canal stenosis. Uncovertebral and facet degenerative changes resulting in moderate bilateral neural foraminal narrowing.   C5-6: Small posterior disc osteophyte complex without significant spinal canal stenosis. Uncovertebral and prominent hypertrophic facet degenerative changes resulting in mild right neural foraminal narrowing.   C6-7: Posterior disc osteophyte complex without significant spinal canal stenosis. Uncovertebral and facet degenerative change resulting in mild right and moderate to severe left neural foraminal narrowing.   C7-T1: Facet degenerative changes resulting in mild-to-moderate left neural foraminal narrowing. No spinal canal stenosis.   MRI LUMBAR SPINE FINDINGS   Segmentation:  Standard segmentation is  assumed.   Alignment:  Dextroconvex curvature.   Vertebrae: No acute fracture, evidence of discitis, or bone lesion. Chronic compression fractures of the L3 endplates with associated Schmorl nodes. Endplate degenerative changes throughout the lumbar spine, more pronounced at L4-5.   Conus medullaris and cauda equina: Conus extends to the L1 level. Conus and cauda equina appear normal.   Paraspinal and other soft tissues: Negative.   Disc levels:   T12-L1: No spinal canal or neural foraminal stenosis.   L1-2: Disc bulge with superiorly migrating left central disc extrusion and mild facet degenerative changes. Mild spinal canal stenosis with mild narrowing of the left subarticular zone and mild bilateral neural foraminal narrowing   L2-3: Loss of disc height, left asymmetric disc bulge, moderate facet degenerative changes and ligamentum flavum redundancy resulting in moderate spinal canal stenosis with moderate right and severe left subarticular zone stenosis and moderate bilateral neural foraminal narrowing.   L3-4: Loss of disc height, disc bulge, moderate facet degenerative changes and ligamentum flavum redundancy resulting mild spinal canal stenosis with moderate narrowing of the bilateral subarticular zones, right greater than left, moderate right and mild left neural foraminal narrowing.   L4-5: Loss of disc height, disc bulge, moderate hypertrophic facet degenerative changes and ligamentum flavum redundancy resulting in mild-to-moderate spinal canal stenosis with moderate narrowing of the bilateral subarticular zones and moderate bilateral neural foraminal narrowing.   L5-S1: Shallow disc bulge and moderate facet degenerative changes without significant spinal canal or neural foraminal stenosis.   IMPRESSION: 1. Degenerative changes of the cervical spine without high-grade spinal canal stenosis at any level. 2. Moderate bilateral neural foraminal narrowing at  C4-5. 3. Mild right and moderate to severe left  neural foraminal narrowing at C6-7. 4. Mild-to-moderate left neural foraminal narrowing at C7-T1. 5. Moderate spinal canal stenosis with moderate right and severe left subarticular zone stenosis and moderate bilateral neural foraminal narrowing at L2-3. 6. Mild spinal canal stenosis with moderate narrowing of the bilateral subarticular zones, moderate right and mild left neural foraminal narrowing at L3-4. 7. Mild-to-moderate spinal canal stenosis with moderate narrowing of the bilateral subarticular zones and moderate bilateral neural foraminal narrowing at L4-5. 8. A 3 cm right thyroid lobe nodule. Recommend thyroid ultrasound for further evaluation.     Electronically Signed   By: Baldemar Lenis M.D.   On: 07/11/2023 10:15       PATIENT SURVEYS:  Modified Oswestry 52% (10/02/2023)   COGNITION: Overall cognitive status: Within functional limits for tasks assessed     SENSATION: Not tested    POSTURE: forward neck, B protracted shouldres, R knee slightly higher, R hip in ER, R lumbar convexity, forward flexed, decreased B hip extension, R lateral shift  Decreased low back pain with R lateral shift correction  PALPATION: TTP L greater trochanter L > R   B lumbar paraspinal muscle tenision R > L  TTP low back R > L L5 > L4 > L3 > L2  LUMBAR ROM:   AROM eval  Flexion WFL  Extension Limtied with L low back pain and L L5 dermatome symptoms to L lateral foot.   Right lateral flexion WFL with low back discomfort  Left lateral flexion WFL with low back discomfort  Right rotation full  Left rotation Limited with L L5 pain to foot   (Blank rows = not tested)  LOWER EXTREMITY ROM:     Passive  Right eval Left eval  Hip flexion    Hip extension    Hip abduction    Hip adduction    Hip internal rotation    Hip external rotation    Knee flexion    Knee extension    Ankle dorsiflexion    Ankle  plantarflexion    Ankle inversion    Ankle eversion     (Blank rows = not tested)  LOWER EXTREMITY MMT:    MMT Right eval Left eval  Hip flexion 4 (with low back pain) 4- with L lateral hip pain  Hip extension 3- 3-  Hip abduction (seated manually resisted) 4 4-  Hip adduction    Hip internal rotation    Hip external rotation    Knee flexion 5 4  Knee extension 5 4+ with L L5 dermatome pain  Ankle dorsiflexion    Ankle plantarflexion    Ankle inversion    Ankle eversion     (Blank rows = not tested)  LUMBAR SPECIAL TESTS:  (+) repeated flexion test.   FUNCTIONAL TESTS:    GAIT: Distance walked: 30 ft Assistive device utilized: None Level of assistance: Complete Independence Comments: R lateral lean, decreased stance R LE, antalgic R LE  TREATMENT DATE: 10/25/2023  Neuromuscular re-education  Sitting in upright neutral posture with R to L pressure to R lumbar convexity with strap   10x3 with 5 second holds   Then with R trunk side bend 10x3   Then with transversus abdominis contraction 10x3 with 5 second holds    Decreased back pain and tightness reported afterwards  Standing B shoulder extension with scapular retraction yellow band 10x5 seconds for 3 sets to promote trunk strength and muscle activation and thoracic extension  Standing with posterior pelvic tilt 10x5 seconds    Improved technique, movement at target joints, use of target muscles after mod verbal, visual, tactile cues.     PATIENT EDUCATION:  Education details: There-ex, HEP Person educated: Patient Education method: Explanation, Demonstration, Tactile cues, Verbal cues, and Handouts Education comprehension: verbalized understanding and returned demonstration  HOME EXERCISE PROGRAM: Access Code: [email or web address] URL: [email or web address] Date:  10/09/2023 Prepared by: Loralyn Freshwater  Exercises - Supine Transversus Abdominis Bracing - Hands on Stomach  - 5 x daily - 7 x weekly - 3 sets - 10 reps - 5 seconds hold - Supine Posterior Pelvic Tilt  - 1 x daily - 7 x weekly - 3 sets - 10 reps - 5 seconds  hold  - Seated Gluteal Sets  - 3 x daily - 7 x weekly - 3 sets - 10 reps - 5 seconds hold - Seated Flexion Stretch  - 3 x daily - 7 x weekly - 1 sets - 10 reps - 10 seconds hold  ASSESSMENT:  CLINICAL IMPRESSION:  Worked on decreased R lumbar convexity and pressure to L lumbar nerves. Decreased back pain and tightness reported with treatment. Also trunk strengthening and decreasing lumbar extension to decrease stress to low back and LE nerves. Pt tolerated session well without aggravation of symptoms. Back feels better after session reported. Pt will benefit from skilled physical therapy services to decrease pain, improve strength and function.        OBJECTIVE IMPAIRMENTS: difficulty walking, decreased strength, improper body mechanics, postural dysfunction, and pain.   ACTIVITY LIMITATIONS: carrying, lifting, bending, sitting, standing, stairs, and locomotion level  PARTICIPATION LIMITATIONS:   PERSONAL FACTORS: Age, Fitness, Past/current experiences, Time since onset of injury/illness/exacerbation, and 3+ comorbidities: anxiety, arthritis, depression, hx of parotid gland tumor, seizures  are also affecting patient's functional outcome.   REHAB POTENTIAL: Fair    CLINICAL DECISION MAKING: Stable/uncomplicated  EVALUATION COMPLEXITY: Low   GOALS: Goals reviewed with patient? Yes  SHORT TERM GOALS: Target date: 10/13/2023  Pt will be independent with her initial HEP to decrease pain, improve strength, function, and ability to perform tasks that involve standing, and walking more comfortably for her back.  Baseline:Pt has not yet started her initial HEP (10/02/2023) Goal status: INITIAL   LONG TERM GOALS: Target date:  12/01/2023  Pt will have a decrease in low back pain to 5/10 or less at worst to promote ability to perform standing and walking tasks as well as negotiate stairs more comfortably.  Baseline: low back pain 10/10 at most for the past 3 months (10/02/2023) Goal status: INITIAL  2.  Pt will have a decrease in L medial thigh pain to 3/10 or less at worst to promote ability to perform standing tasks as well as get into and out of vehicles more comfortably.   Baseline: L inner thigh pain 8/10 at worst for the past 3 months Goal status: INITIAL  3.  Pt will improve her B hip extension and abduction strength by at least  1/2 MMT grade to promote ability to perform standing tasks as well ambulate more comfortably for her back.  Baseline:  MMT Right eval Left eval  Hip extension 3- 3-  Hip abduction (seated manually resisted) 4 4-   (10/02/2023)   Goal status: INITIAL  4.  Pt will improve her Modified Oswestry Low Back Pain disability questionnaire score by at least 10% as a demonstration of improved function.  Baseline: Modified Oswestry Low Back Pain disability questionnaire 52% (10/02/2023) Goal status: INITIAL    PLAN:  PT FREQUENCY: 1-2x/week  PT DURATION: 8 weeks  PLANNED INTERVENTIONS: 97110-Therapeutic exercises, 97530- Therapeutic activity, [id number]- Neuromuscular re-education, 97535- Self Care, [id number]- Manual therapy, [id number]- Aquatic Therapy, 97014- Electrical stimulation (unattended), [id number]- Traction (mechanical), [id number]- Ionotophoresis 4mg /ml Dexamethasone, Patient/Family education, Dry Needling, Joint mobilization, and Spinal mobilization.  PLAN FOR NEXT SESSION: posture, scapular, trunk, glute strengthening, lumbopelvic control, manual techniques, modalities PRN   Kaelen Caughlin, PT, DPT 10/25/2023, 9:47 AM

## 2023-10-30 ENCOUNTER — Ambulatory Visit: Payer: Medicare Other

## 2023-10-31 ENCOUNTER — Ambulatory Visit: Payer: Medicare Other

## 2023-10-31 DIAGNOSIS — R194 Change in bowel habit: Secondary | ICD-10-CM | POA: Diagnosis not present

## 2023-10-31 DIAGNOSIS — R198 Other specified symptoms and signs involving the digestive system and abdomen: Secondary | ICD-10-CM | POA: Diagnosis not present

## 2023-10-31 DIAGNOSIS — R1033 Periumbilical pain: Secondary | ICD-10-CM | POA: Diagnosis not present

## 2023-10-31 DIAGNOSIS — K640 First degree hemorrhoids: Secondary | ICD-10-CM | POA: Diagnosis not present

## 2023-11-01 ENCOUNTER — Ambulatory Visit: Payer: Medicare Other

## 2023-11-02 ENCOUNTER — Ambulatory Visit: Payer: Medicare Other

## 2023-11-02 DIAGNOSIS — M5416 Radiculopathy, lumbar region: Secondary | ICD-10-CM | POA: Diagnosis not present

## 2023-11-02 DIAGNOSIS — M5459 Other low back pain: Secondary | ICD-10-CM | POA: Diagnosis not present

## 2023-11-02 NOTE — Therapy (Signed)
 OUTPATIENT PHYSICAL THERAPY TREATMENT   Patient Name: ARRIANA LOHMANN MRN: [id number] DOB:02-Jul-1951, 73 y.o., female Today's Date: 11/02/2023  END OF SESSION:  PT End of Session - 11/02/23 1120     Visit Number 6    Number of Visits 17    Date for PT Re-Evaluation 12/01/23    PT Start Time 1121    PT Stop Time 1159    PT Time Calculation (min) 38 min    Activity Tolerance Patient tolerated treatment well    Behavior During Therapy [facility] for tasks assessed/performed                  Past Medical History:  Diagnosis Date   Allergy    Seasonal   Anxiety    Arthritis    neck, wrists,hands, toes, and feet   COVID-19 09/08/2021   Depression    Eustachian tube dysfunction    Family history of adverse reaction to anesthesia    brother - PONV and combative   Genital warts    In past   GERD (gastroesophageal reflux disease)    History of shingles 04/16/2015   March 2016    history of Tumor of parotid gland    Hyperlipidemia    Meniere's disease    slight hearing loss, car sickness and dizziness   Migraines    sinus/stress   Monoclonal gammopathy    Polyarthralgia    PONV (postoperative nausea and vomiting)    and headaches and combative   Pre-diabetes    RLS (restless legs syndrome)    Seizures (HCC)    Dx 16 yrs ago - hormonal - none at least 3 yrs   Seizures (HCC)    H/O due to hormone issues   Past Surgical History:  Procedure Laterality Date   ABDOMINAL HYSTERECTOMY     CATARACT EXTRACTION Bilateral 01/2023   CHOLECYSTECTOMY  09/05/2006   dialation and cartarage  1982, 1999, 2000   DISTAL INTERPHALANGEAL JOINT FUSION Left 05/24/2022   Procedure: Left index and ring DIP fusion;  Surgeon: Kennedy Bucker, MD;  Location: [facility];  Service: Orthopedics;  Laterality: Left;   FINGER SURGERY  2011, 2012   Bone fusion of middle finger right and left hands   FINGER SURGERY  09/06/2011   Pin removed from left middle finger   GANGLION CYST EXCISION   2011, 2012   Left and right hands   MYRINGOTOMY WITH TUBE PLACEMENT Bilateral 05/13/2015   Procedure: MYRINGOTOMY WITH  BUTTERFLY TUBE PLACEMENT;  Surgeon: Bud Face, MD;  Location: [facility];  Service: ENT;  Laterality: Bilateral;   MYRINGOTOMY WITH TUBE PLACEMENT Bilateral 08/28/2019   Procedure: MYRINGOTOMY WITH BUTTERFLY TUBE PLACEMENT;  Surgeon: Bud Face, MD;  Location: [facility];  Service: ENT;  Laterality: Bilateral;   PAROTID GLAND TUMOR EXCISION  09/05/1984   TONSILLECTOMY AND ADENOIDECTOMY  09/05/1957   TUBAL LIGATION  09/05/2008   Patient Active Problem List   Diagnosis Date Noted   Influenza A 10/16/2023   Chronic fatigue 02/27/2023   Hot flashes 02/27/2023   Monoclonal gammopathy 12/29/2021   Polyarthralgia 11/01/2021   Left foot pain 11/01/2021   Restless legs 03/09/2021   Prediabetes 02/26/2019   Chronic neck pain 02/26/2019   Hormone replacement therapy (HRT) 02/22/2018   Meniere disease 02/22/2018   Preventative health care 02/11/2016   Hyperlipidemia 02/11/2016   Insomnia 01/22/2016   Depression with anxiety 04/16/2015   GERD (gastroesophageal reflux disease) 04/16/2015   Arthritis 04/16/2015  ETD (eustachian tube dysfunction) 04/16/2015    PCP: Doreene Nest, NP   REFERRING PROVIDER: Venetia Night, MD   REFERRING DIAG: [phone] (ICD-10-CM) - Neurogenic claudication due to lumbar spinal stenosis  Rationale for Evaluation and Treatment: Rehabilitation  THERAPY DIAG:  Other low back pain  Radiculopathy, lumbar region  ONSET DATE: a year ago  SUBJECTIVE:                                                                                                                                                                                           SUBJECTIVE STATEMENT:  Low back is a little tight. R neck and shoulder feel tight too, 4/10 level tightness. Back was a little sore after last week. No L foot numbness  and no L medial thigh pain currently. The treatment helped make her back feel better after last session.     PERTINENT HISTORY:  Low back pain with neurogenic claudication. Has L LE symptoms. She also has L UE pain. Low back pain began about a year ago. Had an MRI for neck and low back. Was referred to Dr. Myer Haff who said that she had a pinched nerve in low back. Had 2 steroid injections for low back, the second injection (December 2024) helped. Has L R and L hip (greater trochanter area bilaterally). Had a steroid injection for L hip 3 months ago which helped.    No hx of osteoporosis No HTN per pt.  No latex allergies   PAIN:  Are you having pain? Yes: NPRS scale: 6/10 Pain location: low back and L inner thigh Pain description: achy, stiff Aggravating factors: stair negotiation, standing (for about 15-20 minutes), walking about 30 minutes in the grocery store, but sometimes a few minutes, sitting (for about 30 minutes) Relieving factors: heating pad , using a shopping cart, sitting in recliner with L leg hanging off to the side.   PRECAUTIONS: None  RED FLAGS: Bowel or bladder incontinence: Yes: both, and both Dr. Myer Haff (does not think it is due to her low back) and Dr. Mariah Milling aware and Cauda equina syndrome: Yes: L thigh tingling.     WEIGHT BEARING RESTRICTIONS: No  FALLS:  Has patient fallen in last 6 months? No  LIVING ENVIRONMENT: Lives with: lives with their spouse Lives in: House/apartment Stairs: Yes: External: 2 steps; none (side of the deck) Has following equipment at home: Grab bars  OCCUPATION: retired   PLOF: Independent  PATIENT GOALS: be able to mop the floor, go to sleep without tossing and turning, be able to walk and not hurt  NEXT MD VISIT: Not yet.  OBJECTIVE:  Note:  Objective measures were completed at Evaluation unless otherwise noted.  DIAGNOSTIC FINDINGS:  MR LUMBAR SPINE WO CONTRAST 06/24/2023  Narrative & Impression  CLINICAL DATA:   Cervicalgia.   Chronic bilateral low back pain with left-sided sciatica.   EXAM: MRI CERVICAL AND LUMBAR SPINE WITHOUT CONTRAST   TECHNIQUE: Multiplanar and multiecho pulse sequences of the cervical spine, to include the craniocervical junction and cervicothoracic junction, and lumbar spine, were obtained without intravenous contrast.   COMPARISON:  Radiographs cervical spine February 26, 2019. CT cervical spine November 21, 2011.   No prior study of the lumbar spine.   FINDINGS: MRI CERVICAL SPINE FINDINGS   Alignment: Small anterolisthesis small anterolisthesis at C3-4, C4-5, C5-6 and C7-T1, similar to prior radiograph.   Vertebrae: No fracture, evidence of discitis, or bone lesion.   Cord: Normal signal and morphology.   Posterior Fossa, vertebral arteries, paraspinal tissues: A 3 cm right thyroid lobe nodule.   Disc levels:   C1-2: Left-sided joint effusion.   C2-3: Facet degenerative changes. No significant spinal canal or neural foraminal stenosis.   C3-4: Mild disc bulge without significant spinal canal stenosis. Uncovertebral and facet degenerative change resulting in mild bilateral neural foraminal narrowing.   C4-5: Small posterior disc protrusion without significant spinal canal stenosis. Uncovertebral and facet degenerative changes resulting in moderate bilateral neural foraminal narrowing.   C5-6: Small posterior disc osteophyte complex without significant spinal canal stenosis. Uncovertebral and prominent hypertrophic facet degenerative changes resulting in mild right neural foraminal narrowing.   C6-7: Posterior disc osteophyte complex without significant spinal canal stenosis. Uncovertebral and facet degenerative change resulting in mild right and moderate to severe left neural foraminal narrowing.   C7-T1: Facet degenerative changes resulting in mild-to-moderate left neural foraminal narrowing. No spinal canal stenosis.   MRI LUMBAR SPINE  FINDINGS   Segmentation:  Standard segmentation is assumed.   Alignment:  Dextroconvex curvature.   Vertebrae: No acute fracture, evidence of discitis, or bone lesion. Chronic compression fractures of the L3 endplates with associated Schmorl nodes. Endplate degenerative changes throughout the lumbar spine, more pronounced at L4-5.   Conus medullaris and cauda equina: Conus extends to the L1 level. Conus and cauda equina appear normal.   Paraspinal and other soft tissues: Negative.   Disc levels:   T12-L1: No spinal canal or neural foraminal stenosis.   L1-2: Disc bulge with superiorly migrating left central disc extrusion and mild facet degenerative changes. Mild spinal canal stenosis with mild narrowing of the left subarticular zone and mild bilateral neural foraminal narrowing   L2-3: Loss of disc height, left asymmetric disc bulge, moderate facet degenerative changes and ligamentum flavum redundancy resulting in moderate spinal canal stenosis with moderate right and severe left subarticular zone stenosis and moderate bilateral neural foraminal narrowing.   L3-4: Loss of disc height, disc bulge, moderate facet degenerative changes and ligamentum flavum redundancy resulting mild spinal canal stenosis with moderate narrowing of the bilateral subarticular zones, right greater than left, moderate right and mild left neural foraminal narrowing.   L4-5: Loss of disc height, disc bulge, moderate hypertrophic facet degenerative changes and ligamentum flavum redundancy resulting in mild-to-moderate spinal canal stenosis with moderate narrowing of the bilateral subarticular zones and moderate bilateral neural foraminal narrowing.   L5-S1: Shallow disc bulge and moderate facet degenerative changes without significant spinal canal or neural foraminal stenosis.   IMPRESSION: 1. Degenerative changes of the cervical spine without high-grade spinal canal stenosis at any level. 2.  Moderate bilateral neural foraminal narrowing at C4-5.  3. Mild right and moderate to severe left neural foraminal narrowing at C6-7. 4. Mild-to-moderate left neural foraminal narrowing at C7-T1. 5. Moderate spinal canal stenosis with moderate right and severe left subarticular zone stenosis and moderate bilateral neural foraminal narrowing at L2-3. 6. Mild spinal canal stenosis with moderate narrowing of the bilateral subarticular zones, moderate right and mild left neural foraminal narrowing at L3-4. 7. Mild-to-moderate spinal canal stenosis with moderate narrowing of the bilateral subarticular zones and moderate bilateral neural foraminal narrowing at L4-5. 8. A 3 cm right thyroid lobe nodule. Recommend thyroid ultrasound for further evaluation.     Electronically Signed   By: Baldemar Lenis M.D.   On: 07/11/2023 10:15       PATIENT SURVEYS:  Modified Oswestry 52% (10/02/2023)   COGNITION: Overall cognitive status: Within functional limits for tasks assessed     SENSATION: Not tested    POSTURE: forward neck, B protracted shouldres, R knee slightly higher, R hip in ER, R lumbar convexity, forward flexed, decreased B hip extension, R lateral shift  Decreased low back pain with R lateral shift correction  PALPATION: TTP L greater trochanter L > R   B lumbar paraspinal muscle tenision R > L  TTP low back R > L L5 > L4 > L3 > L2  LUMBAR ROM:   AROM eval  Flexion WFL  Extension Limtied with L low back pain and L L5 dermatome symptoms to L lateral foot.   Right lateral flexion WFL with low back discomfort  Left lateral flexion WFL with low back discomfort  Right rotation full  Left rotation Limited with L L5 pain to foot   (Blank rows = not tested)  LOWER EXTREMITY ROM:     Passive  Right eval Left eval  Hip flexion    Hip extension    Hip abduction    Hip adduction    Hip internal rotation    Hip external rotation    Knee flexion    Knee  extension    Ankle dorsiflexion    Ankle plantarflexion    Ankle inversion    Ankle eversion     (Blank rows = not tested)  LOWER EXTREMITY MMT:    MMT Right eval Left eval  Hip flexion 4 (with low back pain) 4- with L lateral hip pain  Hip extension 3- 3-  Hip abduction (seated manually resisted) 4 4-  Hip adduction    Hip internal rotation    Hip external rotation    Knee flexion 5 4  Knee extension 5 4+ with L L5 dermatome pain  Ankle dorsiflexion    Ankle plantarflexion    Ankle inversion    Ankle eversion     (Blank rows = not tested)  LUMBAR SPECIAL TESTS:  (+) repeated flexion test.   FUNCTIONAL TESTS:    GAIT: Distance walked: 30 ft Assistive device utilized: None Level of assistance: Complete Independence Comments: R lateral lean, decreased stance R LE, antalgic R LE  TREATMENT DATE: 11/02/2023  Neuromuscular re-education  Sitting in upright neutral posture with R to L pressure to R lumbar convexity with strap   10x3 with 5 second holds   Then with R trunk side bend 10x3   Then with transversus abdominis contraction 10x3 with 5 second holds    Back feels better after aforementioned exercises reported.     More neutral lumbar posture observed (decreased R lumbar convexity)  Standing R lumbar side bend stretch with R UE assist   10x5 seconds for 3 sets   Standing B shoulder extension with scapular retraction yellow band 10x5 seconds for 3 sets to promote trunk strength and muscle activation and thoracic extension   Decreased R shoulder pain reported   Improved technique, movement at target joints, use of target muscles after mod verbal, visual, tactile cues.     PATIENT EDUCATION:  Education details: There-ex, HEP Person educated: Patient Education method: Explanation, Demonstration, Tactile cues, Verbal cues, and  Handouts Education comprehension: verbalized understanding and returned demonstration  HOME EXERCISE PROGRAM: Access Code: [email or web address] URL: [email or web address] Date: 10/09/2023 Prepared by: Loralyn Freshwater  Exercises - Supine Transversus Abdominis Bracing - Hands on Stomach  - 5 x daily - 7 x weekly - 3 sets - 10 reps - 5 seconds hold - Supine Posterior Pelvic Tilt  - 1 x daily - 7 x weekly - 3 sets - 10 reps - 5 seconds  hold  - Seated Gluteal Sets  - 3 x daily - 7 x weekly - 3 sets - 10 reps - 5 seconds hold - Seated Flexion Stretch  - 3 x daily - 7 x weekly - 1 sets - 10 reps - 10 seconds hold - Standing Sidebends  - 1 x daily - 7 x weekly - 3 sets - 10 reps - 5 seconds hold  To the R side  ASSESSMENT:  CLINICAL IMPRESSION:  Continued working on decreasing R lumbar convexity and pressure to L lumbar nerves. Decreased back pain and tightness reported with treatment. Also worked on trunk strengthening and decreasing lumbar extension to decrease stress to low back and LE nerves. Back feels better after session reported. Pt will benefit from skilled physical therapy services to decrease pain, improve strength and function.        OBJECTIVE IMPAIRMENTS: difficulty walking, decreased strength, improper body mechanics, postural dysfunction, and pain.   ACTIVITY LIMITATIONS: carrying, lifting, bending, sitting, standing, stairs, and locomotion level  PARTICIPATION LIMITATIONS:   PERSONAL FACTORS: Age, Fitness, Past/current experiences, Time since onset of injury/illness/exacerbation, and 3+ comorbidities: anxiety, arthritis, depression, hx of parotid gland tumor, seizures  are also affecting patient's functional outcome.   REHAB POTENTIAL: Fair    CLINICAL DECISION MAKING: Stable/uncomplicated  EVALUATION COMPLEXITY: Low   GOALS: Goals reviewed with patient? Yes  SHORT TERM GOALS: Target date: 10/13/2023  Pt will be independent with her initial HEP to decrease pain,  improve strength, function, and ability to perform tasks that involve standing, and walking more comfortably for her back.  Baseline:Pt has not yet started her initial HEP (10/02/2023) Goal status: INITIAL   LONG TERM GOALS: Target date: 12/01/2023  Pt will have a decrease in low back pain to 5/10 or less at worst to promote ability to perform standing and walking tasks as well as negotiate stairs more comfortably.  Baseline: low back pain 10/10 at most for the past 3 months (10/02/2023) Goal status: INITIAL  2.  Pt will have a decrease in L medial thigh pain to 3/10 or less at worst  to promote ability to perform standing tasks as well as get into and out of vehicles more comfortably.   Baseline: L inner thigh pain 8/10 at worst for the past 3 months Goal status: INITIAL  3.  Pt will improve her B hip extension and abduction strength by at least 1/2 MMT grade to promote ability to perform standing tasks as well ambulate more comfortably for her back.  Baseline:  MMT Right eval Left eval  Hip extension 3- 3-  Hip abduction (seated manually resisted) 4 4-   (10/02/2023)   Goal status: INITIAL  4.  Pt will improve her Modified Oswestry Low Back Pain disability questionnaire score by at least 10% as a demonstration of improved function.  Baseline: Modified Oswestry Low Back Pain disability questionnaire 52% (10/02/2023) Goal status: INITIAL    PLAN:  PT FREQUENCY: 1-2x/week  PT DURATION: 8 weeks  PLANNED INTERVENTIONS: 97110-Therapeutic exercises, 97530- Therapeutic activity, [id number]- Neuromuscular re-education, 97535- Self Care, [id number]- Manual therapy, [id number]- Aquatic Therapy, 97014- Electrical stimulation (unattended), [id number]- Traction (mechanical), [id number]- Ionotophoresis 4mg /ml Dexamethasone, Patient/Family education, Dry Needling, Joint mobilization, and Spinal mobilization.  PLAN FOR NEXT SESSION: posture, scapular, trunk, glute strengthening, lumbopelvic control, manual techniques,  modalities PRN   Katy Brickell, PT, DPT 11/02/2023, 12:08 PM

## 2023-11-03 ENCOUNTER — Other Ambulatory Visit: Payer: Self-pay

## 2023-11-03 MED ORDER — CELECOXIB 100 MG PO CAPS
200.0000 mg | ORAL_CAPSULE | Freq: Two times a day (BID) | ORAL | 1 refills | Status: DC | PRN
Start: 2023-11-03 — End: 2024-05-07
  Filled 2023-11-03: qty 120, 30d supply, fill #0
  Filled 2023-12-05: qty 120, 30d supply, fill #1
  Filled 2023-12-06: qty 120, 60d supply, fill #1
  Filled 2024-01-11: qty 120, 60d supply, fill #2
  Filled 2024-01-11 (×2): qty 120, 30d supply, fill #2
  Filled 2024-01-11: qty 120, 60d supply, fill #2
  Filled 2024-02-07: qty 120, 30d supply, fill #3
  Filled 2024-03-06: qty 120, 30d supply, fill #4
  Filled 2024-04-02: qty 120, 30d supply, fill #5

## 2023-11-06 ENCOUNTER — Ambulatory Visit: Payer: Medicare Other | Attending: Neurosurgery

## 2023-11-06 DIAGNOSIS — M5416 Radiculopathy, lumbar region: Secondary | ICD-10-CM | POA: Insufficient documentation

## 2023-11-06 DIAGNOSIS — M5459 Other low back pain: Secondary | ICD-10-CM | POA: Diagnosis not present

## 2023-11-06 NOTE — Therapy (Signed)
 OUTPATIENT PHYSICAL THERAPY TREATMENT   Patient Name: April Nguyen MRN: [id number] DOB:24-Oct-1950, 73 y.o., female Today's Date: 11/06/2023  END OF SESSION:  PT End of Session - 11/06/23 1120     Visit Number 7    Number of Visits 17    Date for PT Re-Evaluation 12/01/23    PT Start Time 1120    PT Stop Time 1202    PT Time Calculation (min) 42 min    Activity Tolerance Patient tolerated treatment well    Behavior During Therapy [employer] for tasks assessed/performed                   Past Medical History:  Diagnosis Date   Allergy    Seasonal   Anxiety    Arthritis    neck, wrists,hands, toes, and feet   COVID-19 09/08/2021   Depression    Eustachian tube dysfunction    Family history of adverse reaction to anesthesia    brother - PONV and combative   Genital warts    In past   GERD (gastroesophageal reflux disease)    History of shingles 04/16/2015   March 2016    history of Tumor of parotid gland    Hyperlipidemia    Meniere's disease    slight hearing loss, car sickness and dizziness   Migraines    sinus/stress   Monoclonal gammopathy    Polyarthralgia    PONV (postoperative nausea and vomiting)    and headaches and combative   Pre-diabetes    RLS (restless legs syndrome)    Seizures (HCC)    Dx 16 yrs ago - hormonal - none at least 3 yrs   Seizures (HCC)    H/O due to hormone issues   Past Surgical History:  Procedure Laterality Date   ABDOMINAL HYSTERECTOMY     CATARACT EXTRACTION Bilateral 01/2023   CHOLECYSTECTOMY  09/05/2006   dialation and cartarage  1982, 1999, 2000   DISTAL INTERPHALANGEAL JOINT FUSION Left 05/24/2022   Procedure: Left index and ring DIP fusion;  Surgeon: Kennedy Bucker, MD;  Location: [facility];  Service: Orthopedics;  Laterality: Left;   FINGER SURGERY  2011, 2012   Bone fusion of middle finger right and left hands   FINGER SURGERY  09/06/2011   Pin removed from left middle finger   GANGLION CYST EXCISION   2011, 2012   Left and right hands   MYRINGOTOMY WITH TUBE PLACEMENT Bilateral 05/13/2015   Procedure: MYRINGOTOMY WITH  BUTTERFLY TUBE PLACEMENT;  Surgeon: Bud Face, MD;  Location: [facility];  Service: ENT;  Laterality: Bilateral;   MYRINGOTOMY WITH TUBE PLACEMENT Bilateral 08/28/2019   Procedure: MYRINGOTOMY WITH BUTTERFLY TUBE PLACEMENT;  Surgeon: Bud Face, MD;  Location: [facility];  Service: ENT;  Laterality: Bilateral;   PAROTID GLAND TUMOR EXCISION  09/05/1984   TONSILLECTOMY AND ADENOIDECTOMY  09/05/1957   TUBAL LIGATION  09/05/2008   Patient Active Problem List   Diagnosis Date Noted   Influenza A 10/16/2023   Chronic fatigue 02/27/2023   Hot flashes 02/27/2023   Monoclonal gammopathy 12/29/2021   Polyarthralgia 11/01/2021   Left foot pain 11/01/2021   Restless legs 03/09/2021   Prediabetes 02/26/2019   Chronic neck pain 02/26/2019   Hormone replacement therapy (HRT) 02/22/2018   Meniere disease 02/22/2018   Preventative health care 02/11/2016   Hyperlipidemia 02/11/2016   Insomnia 01/22/2016   Depression with anxiety 04/16/2015   GERD (gastroesophageal reflux disease) 04/16/2015   Arthritis 04/16/2015  ETD (eustachian tube dysfunction) 04/16/2015    PCP: Doreene Nest, NP   REFERRING PROVIDER: Venetia Night, MD   REFERRING DIAG: [phone] (ICD-10-CM) - Neurogenic claudication due to lumbar spinal stenosis  Rationale for Evaluation and Treatment: Rehabilitation  THERAPY DIAG:  Other low back pain  Radiculopathy, lumbar region  ONSET DATE: a year ago  SUBJECTIVE:                                                                                                                                                                                           SUBJECTIVE STATEMENT: No L medial thigh pain currently. Back is a little stiff, back is about a 5/10 currently.    PERTINENT HISTORY:  Low back pain with neurogenic  claudication. Has L LE symptoms. She also has L UE pain. Low back pain began about a year ago. Had an MRI for neck and low back. Was referred to Dr. Myer Haff who said that she had a pinched nerve in low back. Had 2 steroid injections for low back, the second injection (December 2024) helped. Has L R and L hip (greater trochanter area bilaterally). Had a steroid injection for L hip 3 months ago which helped.    No hx of osteoporosis No HTN per pt.  No latex allergies   PAIN:  Are you having pain? Yes: NPRS scale: 6/10 Pain location: low back and L inner thigh Pain description: achy, stiff Aggravating factors: stair negotiation, standing (for about 15-20 minutes), walking about 30 minutes in the grocery store, but sometimes a few minutes, sitting (for about 30 minutes) Relieving factors: heating pad , using a shopping cart, sitting in recliner with L leg hanging off to the side.   PRECAUTIONS: None  RED FLAGS: Bowel or bladder incontinence: Yes: both, and both Dr. Myer Haff (does not think it is due to her low back) and Dr. Mariah Milling aware and Cauda equina syndrome: Yes: L thigh tingling.     WEIGHT BEARING RESTRICTIONS: No  FALLS:  Has patient fallen in last 6 months? No  LIVING ENVIRONMENT: Lives with: lives with their spouse Lives in: House/apartment Stairs: Yes: External: 2 steps; none (side of the deck) Has following equipment at home: Grab bars  OCCUPATION: retired   PLOF: Independent  PATIENT GOALS: be able to mop the floor, go to sleep without tossing and turning, be able to walk and not hurt  NEXT MD VISIT: Not yet.  OBJECTIVE:  Note: Objective measures were completed at Evaluation unless otherwise noted.  DIAGNOSTIC FINDINGS:  MR LUMBAR SPINE WO CONTRAST 06/24/2023  Narrative & Impression  CLINICAL DATA:  Cervicalgia.   Chronic  bilateral low back pain with left-sided sciatica.   EXAM: MRI CERVICAL AND LUMBAR SPINE WITHOUT CONTRAST   TECHNIQUE: Multiplanar  and multiecho pulse sequences of the cervical spine, to include the craniocervical junction and cervicothoracic junction, and lumbar spine, were obtained without intravenous contrast.   COMPARISON:  Radiographs cervical spine February 26, 2019. CT cervical spine November 21, 2011.   No prior study of the lumbar spine.   FINDINGS: MRI CERVICAL SPINE FINDINGS   Alignment: Small anterolisthesis small anterolisthesis at C3-4, C4-5, C5-6 and C7-T1, similar to prior radiograph.   Vertebrae: No fracture, evidence of discitis, or bone lesion.   Cord: Normal signal and morphology.   Posterior Fossa, vertebral arteries, paraspinal tissues: A 3 cm right thyroid lobe nodule.   Disc levels:   C1-2: Left-sided joint effusion.   C2-3: Facet degenerative changes. No significant spinal canal or neural foraminal stenosis.   C3-4: Mild disc bulge without significant spinal canal stenosis. Uncovertebral and facet degenerative change resulting in mild bilateral neural foraminal narrowing.   C4-5: Small posterior disc protrusion without significant spinal canal stenosis. Uncovertebral and facet degenerative changes resulting in moderate bilateral neural foraminal narrowing.   C5-6: Small posterior disc osteophyte complex without significant spinal canal stenosis. Uncovertebral and prominent hypertrophic facet degenerative changes resulting in mild right neural foraminal narrowing.   C6-7: Posterior disc osteophyte complex without significant spinal canal stenosis. Uncovertebral and facet degenerative change resulting in mild right and moderate to severe left neural foraminal narrowing.   C7-T1: Facet degenerative changes resulting in mild-to-moderate left neural foraminal narrowing. No spinal canal stenosis.   MRI LUMBAR SPINE FINDINGS   Segmentation:  Standard segmentation is assumed.   Alignment:  Dextroconvex curvature.   Vertebrae: No acute fracture, evidence of discitis, or bone  lesion. Chronic compression fractures of the L3 endplates with associated Schmorl nodes. Endplate degenerative changes throughout the lumbar spine, more pronounced at L4-5.   Conus medullaris and cauda equina: Conus extends to the L1 level. Conus and cauda equina appear normal.   Paraspinal and other soft tissues: Negative.   Disc levels:   T12-L1: No spinal canal or neural foraminal stenosis.   L1-2: Disc bulge with superiorly migrating left central disc extrusion and mild facet degenerative changes. Mild spinal canal stenosis with mild narrowing of the left subarticular zone and mild bilateral neural foraminal narrowing   L2-3: Loss of disc height, left asymmetric disc bulge, moderate facet degenerative changes and ligamentum flavum redundancy resulting in moderate spinal canal stenosis with moderate right and severe left subarticular zone stenosis and moderate bilateral neural foraminal narrowing.   L3-4: Loss of disc height, disc bulge, moderate facet degenerative changes and ligamentum flavum redundancy resulting mild spinal canal stenosis with moderate narrowing of the bilateral subarticular zones, right greater than left, moderate right and mild left neural foraminal narrowing.   L4-5: Loss of disc height, disc bulge, moderate hypertrophic facet degenerative changes and ligamentum flavum redundancy resulting in mild-to-moderate spinal canal stenosis with moderate narrowing of the bilateral subarticular zones and moderate bilateral neural foraminal narrowing.   L5-S1: Shallow disc bulge and moderate facet degenerative changes without significant spinal canal or neural foraminal stenosis.   IMPRESSION: 1. Degenerative changes of the cervical spine without high-grade spinal canal stenosis at any level. 2. Moderate bilateral neural foraminal narrowing at C4-5. 3. Mild right and moderate to severe left neural foraminal narrowing at C6-7. 4. Mild-to-moderate left neural  foraminal narrowing at C7-T1. 5. Moderate spinal canal stenosis with moderate right and severe  left subarticular zone stenosis and moderate bilateral neural foraminal narrowing at L2-3. 6. Mild spinal canal stenosis with moderate narrowing of the bilateral subarticular zones, moderate right and mild left neural foraminal narrowing at L3-4. 7. Mild-to-moderate spinal canal stenosis with moderate narrowing of the bilateral subarticular zones and moderate bilateral neural foraminal narrowing at L4-5. 8. A 3 cm right thyroid lobe nodule. Recommend thyroid ultrasound for further evaluation.     Electronically Signed   By: Baldemar Lenis M.D.   On: 07/11/2023 10:15       PATIENT SURVEYS:  Modified Oswestry 52% (10/02/2023)   COGNITION: Overall cognitive status: Within functional limits for tasks assessed     SENSATION: Not tested    POSTURE: forward neck, B protracted shouldres, R knee slightly higher, R hip in ER, R lumbar convexity, forward flexed, decreased B hip extension, R lateral shift  Decreased low back pain with R lateral shift correction  PALPATION: TTP L greater trochanter L > R   B lumbar paraspinal muscle tenision R > L  TTP low back R > L L5 > L4 > L3 > L2  LUMBAR ROM:   AROM eval  Flexion WFL  Extension Limtied with L low back pain and L L5 dermatome symptoms to L lateral foot.   Right lateral flexion WFL with low back discomfort  Left lateral flexion WFL with low back discomfort  Right rotation full  Left rotation Limited with L L5 pain to foot   (Blank rows = not tested)  LOWER EXTREMITY ROM:     Passive  Right eval Left eval  Hip flexion    Hip extension    Hip abduction    Hip adduction    Hip internal rotation    Hip external rotation    Knee flexion    Knee extension    Ankle dorsiflexion    Ankle plantarflexion    Ankle inversion    Ankle eversion     (Blank rows = not tested)  LOWER EXTREMITY MMT:    MMT  Right eval Left eval  Hip flexion 4 (with low back pain) 4- with L lateral hip pain  Hip extension 3- 3-  Hip abduction (seated manually resisted) 4 4-  Hip adduction    Hip internal rotation    Hip external rotation    Knee flexion 5 4  Knee extension 5 4+ with L L5 dermatome pain  Ankle dorsiflexion    Ankle plantarflexion    Ankle inversion    Ankle eversion     (Blank rows = not tested)  LUMBAR SPECIAL TESTS:  (+) repeated flexion test.   FUNCTIONAL TESTS:    GAIT: Distance walked: 30 ft Assistive device utilized: None Level of assistance: Complete Independence Comments: R lateral lean, decreased stance R LE, antalgic R LE  TREATMENT DATE: 11/06/2023  Neuromuscular re-education  Seated R trunk side bend against chair back with towel roll at R lumbar convexity 10x3 with 5 second holds  Then in that position   Transversus abdominis contraction 10x3 with 5 second holds  Standing R shoulder adduction green band 10x2, then 6x to help decrease R lumbar convexity.   R upper trap tension   Standing B shoulder extension with scapular retraction yellow band 10x5 seconds for 2 sets to promote trunk strength and muscle activation and thoracic extension   Improved technique, movement at target joints, use of target muscles after mod verbal, visual, tactile cues.   Manual therapy Seated STM R upper trap muscle to decrease tension   Seated STM B lumbar paraspinal muscles to decrease tensioun        PATIENT EDUCATION:  Education details: There-ex, HEP Person educated: Patient Education method: Explanation, Demonstration, Tactile cues, Verbal cues, and Handouts Education comprehension: verbalized understanding and returned demonstration  HOME EXERCISE PROGRAM: Access Code: [email or web address] URL: [email or web address] Date:  10/09/2023 Prepared by: Loralyn Freshwater  Exercises - Supine Transversus Abdominis Bracing - Hands on Stomach  - 5 x daily - 7 x weekly - 3 sets - 10 reps - 5 seconds hold - Supine Posterior Pelvic Tilt  - 1 x daily - 7 x weekly - 3 sets - 10 reps - 5 seconds  hold  - Seated Gluteal Sets  - 3 x daily - 7 x weekly - 3 sets - 10 reps - 5 seconds hold - Seated Flexion Stretch  - 3 x daily - 7 x weekly - 1 sets - 10 reps - 10 seconds hold - Standing Sidebends  - 1 x daily - 7 x weekly - 3 sets - 10 reps - 5 seconds hold  To the R side  ASSESSMENT:  CLINICAL IMPRESSION:  Continued working on decreasing R lumbar convexity and pressure to L lumbar nerves as well as trunk strengthening to decrease stress to low back. Worked on STM to R upper trap muscle to decrease tightness from an exercises. Worked on STM to decrease B lumbar paraspinal muscle tension to low back as well. Back feels more loose after session reported. Pt will benefit from skilled physical therapy services to decrease pain, improve strength and function.        OBJECTIVE IMPAIRMENTS: difficulty walking, decreased strength, improper body mechanics, postural dysfunction, and pain.   ACTIVITY LIMITATIONS: carrying, lifting, bending, sitting, standing, stairs, and locomotion level  PARTICIPATION LIMITATIONS:   PERSONAL FACTORS: Age, Fitness, Past/current experiences, Time since onset of injury/illness/exacerbation, and 3+ comorbidities: anxiety, arthritis, depression, hx of parotid gland tumor, seizures  are also affecting patient's functional outcome.   REHAB POTENTIAL: Fair    CLINICAL DECISION MAKING: Stable/uncomplicated  EVALUATION COMPLEXITY: Low   GOALS: Goals reviewed with patient? Yes  SHORT TERM GOALS: Target date: 10/13/2023  Pt will be independent with her initial HEP to decrease pain, improve strength, function, and ability to perform tasks that involve standing, and walking more comfortably for her back.   Baseline:Pt has not yet started her initial HEP (10/02/2023) Goal status: INITIAL   LONG TERM GOALS: Target date: 12/01/2023  Pt will have a decrease in low back pain to 5/10 or less at worst to promote ability to perform standing and walking tasks as well as negotiate stairs more comfortably.  Baseline: low back pain 10/10 at most for the past 3 months (10/02/2023); 8/10 at most (in the mornings) for the past 7 days. Gets better after taking  the celebrex which she has been taking for the past 2 years (11/06/2023) Goal status: partially met  2.  Pt will have a decrease in L medial thigh pain to 3/10 or less at worst to promote ability to perform standing tasks as well as get into and out of vehicles more comfortably.   Baseline: L inner thigh pain 8/10 at worst for the past 3 months Goal status: INITIAL  3.  Pt will improve her B hip extension and abduction strength by at least 1/2 MMT grade to promote ability to perform standing tasks as well ambulate more comfortably for her back.  Baseline:  MMT Right eval Left eval  Hip extension 3- 3-  Hip abduction (seated manually resisted) 4 4-   (10/02/2023)   Goal status: INITIAL  4.  Pt will improve her Modified Oswestry Low Back Pain disability questionnaire score by at least 10% as a demonstration of improved function.  Baseline: Modified Oswestry Low Back Pain disability questionnaire 52% (10/02/2023) Goal status: INITIAL    PLAN:  PT FREQUENCY: 1-2x/week  PT DURATION: 8 weeks  PLANNED INTERVENTIONS: 97110-Therapeutic exercises, 97530- Therapeutic activity, [id number]- Neuromuscular re-education, 97535- Self Care, [id number]- Manual therapy, [id number]- Aquatic Therapy, [id number]- Electrical stimulation (unattended), [id number]- Traction (mechanical), [id number]- Ionotophoresis 4mg /ml Dexamethasone, Patient/Family education, Dry Needling, Joint mobilization, and Spinal mobilization.  PLAN FOR NEXT SESSION: posture, scapular, trunk, glute strengthening,  lumbopelvic control, manual techniques, modalities PRN   Adreana Coull, PT, DPT 11/06/2023, 12:44 PM

## 2023-11-08 ENCOUNTER — Ambulatory Visit: Payer: Medicare Other

## 2023-11-08 DIAGNOSIS — M5459 Other low back pain: Secondary | ICD-10-CM

## 2023-11-08 DIAGNOSIS — M5416 Radiculopathy, lumbar region: Secondary | ICD-10-CM | POA: Diagnosis not present

## 2023-11-08 NOTE — Therapy (Signed)
 OUTPATIENT PHYSICAL THERAPY TREATMENT   Patient Name: April Nguyen MRN: [id number] DOB:[birth year], 73 y.o., female Today's Date: 11/08/2023  END OF SESSION:  PT End of Session - 11/08/23 0955     Visit Number 8    Number of Visits 17    Date for PT Re-Evaluation 12/01/23    PT Start Time 0955   pt arrived late   PT Stop Time 1033    PT Time Calculation (min) 38 min    Activity Tolerance Patient tolerated treatment well    Behavior During Therapy [facility] for tasks assessed/performed                    Past Medical History:  Diagnosis Date   Allergy    Seasonal   Anxiety    Arthritis    neck, wrists,hands, toes, and feet   COVID-19 09/08/2021   Depression    Eustachian tube dysfunction    Family history of adverse reaction to anesthesia    brother - PONV and combative   Genital warts    In past   GERD (gastroesophageal reflux disease)    History of shingles 04/16/2015   March 2016    history of Tumor of parotid gland    Hyperlipidemia    Meniere's disease    slight hearing loss, car sickness and dizziness   Migraines    sinus/stress   Monoclonal gammopathy    Polyarthralgia    PONV (postoperative nausea and vomiting)    and headaches and combative   Pre-diabetes    RLS (restless legs syndrome)    Seizures (HCC)    Dx 16 yrs ago - hormonal - none at least 3 yrs   Seizures (HCC)    H/O due to hormone issues   Past Surgical History:  Procedure Laterality Date   ABDOMINAL HYSTERECTOMY     CATARACT EXTRACTION Bilateral 01/2023   CHOLECYSTECTOMY  09/05/2006   dialation and cartarage  1982, 1999, 2000   DISTAL INTERPHALANGEAL JOINT FUSION Left 05/24/2022   Procedure: Left index and ring DIP fusion;  Surgeon: Kennedy Bucker, MD;  Location: [facility];  Service: Orthopedics;  Laterality: Left;   FINGER SURGERY  2011, 2012   Bone fusion of middle finger right and left hands   FINGER SURGERY  09/06/2011   Pin removed from left middle finger    GANGLION CYST EXCISION  2011, 2012   Left and right hands   MYRINGOTOMY WITH TUBE PLACEMENT Bilateral 05/13/2015   Procedure: MYRINGOTOMY WITH  BUTTERFLY TUBE PLACEMENT;  Surgeon: Bud Face, MD;  Location: [facility];  Service: ENT;  Laterality: Bilateral;   MYRINGOTOMY WITH TUBE PLACEMENT Bilateral 08/28/2019   Procedure: MYRINGOTOMY WITH BUTTERFLY TUBE PLACEMENT;  Surgeon: Bud Face, MD;  Location: [facility];  Service: ENT;  Laterality: Bilateral;   PAROTID GLAND TUMOR EXCISION  09/05/1984   TONSILLECTOMY AND ADENOIDECTOMY  09/05/1957   TUBAL LIGATION  09/05/2008   Patient Active Problem List   Diagnosis Date Noted   Influenza A 10/16/2023   Chronic fatigue 02/27/2023   Hot flashes 02/27/2023   Monoclonal gammopathy 12/29/2021   Polyarthralgia 11/01/2021   Left foot pain 11/01/2021   Restless legs 03/09/2021   Prediabetes 02/26/2019   Chronic neck pain 02/26/2019   Hormone replacement therapy (HRT) 02/22/2018   Meniere disease 02/22/2018   Preventative health care 02/11/2016   Hyperlipidemia 02/11/2016   Insomnia 01/22/2016   Depression with anxiety 04/16/2015   GERD (gastroesophageal reflux disease) 04/16/2015  Arthritis 04/16/2015   ETD (eustachian tube dysfunction) 04/16/2015    PCP: Doreene Nest, NP   REFERRING PROVIDER: Venetia Night, MD   REFERRING DIAG: [phone] (ICD-10-CM) - Neurogenic claudication due to lumbar spinal stenosis  Rationale for Evaluation and Treatment: Rehabilitation  THERAPY DIAG:  Other low back pain  Radiculopathy, lumbar region  ONSET DATE: a year ago  SUBJECTIVE:                                                                                                                                                                                           SUBJECTIVE STATEMENT: Back is ok, about 4/10 stiffness, nothing sharp. No L LE symptoms. Was sore in R low back and hip after last session and last  night.    PERTINENT HISTORY:  Low back pain with neurogenic claudication. Has L LE symptoms. She also has L UE pain. Low back pain began about a year ago. Had an MRI for neck and low back. Was referred to Dr. Myer Haff who said that she had a pinched nerve in low back. Had 2 steroid injections for low back, the second injection (December 2024) helped. Has L R and L hip (greater trochanter area bilaterally). Had a steroid injection for L hip 3 months ago which helped.    No hx of osteoporosis No HTN per pt.  No latex allergies   PAIN:  Are you having pain? Yes: NPRS scale: 6/10 Pain location: low back and L inner thigh Pain description: achy, stiff Aggravating factors: stair negotiation, standing (for about 15-20 minutes), walking about 30 minutes in the grocery store, but sometimes a few minutes, sitting (for about 30 minutes) Relieving factors: heating pad , using a shopping cart, sitting in recliner with L leg hanging off to the side.   PRECAUTIONS: None  RED FLAGS: Bowel or bladder incontinence: Yes: both, and both Dr. Myer Haff (does not think it is due to her low back) and Dr. Mariah Milling aware and Cauda equina syndrome: Yes: L thigh tingling.     WEIGHT BEARING RESTRICTIONS: No  FALLS:  Has patient fallen in last 6 months? No  LIVING ENVIRONMENT: Lives with: lives with their spouse Lives in: House/apartment Stairs: Yes: External: 2 steps; none (side of the deck) Has following equipment at home: Grab bars  OCCUPATION: retired   PLOF: Independent  PATIENT GOALS: be able to mop the floor, go to sleep without tossing and turning, be able to walk and not hurt  NEXT MD VISIT: Not yet.  OBJECTIVE:  Note: Objective measures were completed at Evaluation unless otherwise noted.  DIAGNOSTIC FINDINGS:  MR LUMBAR SPINE WO CONTRAST  06/24/2023  Narrative & Impression  CLINICAL DATA:  Cervicalgia.   Chronic bilateral low back pain with left-sided sciatica.   EXAM: MRI CERVICAL  AND LUMBAR SPINE WITHOUT CONTRAST   TECHNIQUE: Multiplanar and multiecho pulse sequences of the cervical spine, to include the craniocervical junction and cervicothoracic junction, and lumbar spine, were obtained without intravenous contrast.   COMPARISON:  Radiographs cervical spine February 26, 2019. CT cervical spine November 21, 2011.   No prior study of the lumbar spine.   FINDINGS: MRI CERVICAL SPINE FINDINGS   Alignment: Small anterolisthesis small anterolisthesis at C3-4, C4-5, C5-6 and C7-T1, similar to prior radiograph.   Vertebrae: No fracture, evidence of discitis, or bone lesion.   Cord: Normal signal and morphology.   Posterior Fossa, vertebral arteries, paraspinal tissues: A 3 cm right thyroid lobe nodule.   Disc levels:   C1-2: Left-sided joint effusion.   C2-3: Facet degenerative changes. No significant spinal canal or neural foraminal stenosis.   C3-4: Mild disc bulge without significant spinal canal stenosis. Uncovertebral and facet degenerative change resulting in mild bilateral neural foraminal narrowing.   C4-5: Small posterior disc protrusion without significant spinal canal stenosis. Uncovertebral and facet degenerative changes resulting in moderate bilateral neural foraminal narrowing.   C5-6: Small posterior disc osteophyte complex without significant spinal canal stenosis. Uncovertebral and prominent hypertrophic facet degenerative changes resulting in mild right neural foraminal narrowing.   C6-7: Posterior disc osteophyte complex without significant spinal canal stenosis. Uncovertebral and facet degenerative change resulting in mild right and moderate to severe left neural foraminal narrowing.   C7-T1: Facet degenerative changes resulting in mild-to-moderate left neural foraminal narrowing. No spinal canal stenosis.   MRI LUMBAR SPINE FINDINGS   Segmentation:  Standard segmentation is assumed.   Alignment:  Dextroconvex curvature.    Vertebrae: No acute fracture, evidence of discitis, or bone lesion. Chronic compression fractures of the L3 endplates with associated Schmorl nodes. Endplate degenerative changes throughout the lumbar spine, more pronounced at L4-5.   Conus medullaris and cauda equina: Conus extends to the L1 level. Conus and cauda equina appear normal.   Paraspinal and other soft tissues: Negative.   Disc levels:   T12-L1: No spinal canal or neural foraminal stenosis.   L1-2: Disc bulge with superiorly migrating left central disc extrusion and mild facet degenerative changes. Mild spinal canal stenosis with mild narrowing of the left subarticular zone and mild bilateral neural foraminal narrowing   L2-3: Loss of disc height, left asymmetric disc bulge, moderate facet degenerative changes and ligamentum flavum redundancy resulting in moderate spinal canal stenosis with moderate right and severe left subarticular zone stenosis and moderate bilateral neural foraminal narrowing.   L3-4: Loss of disc height, disc bulge, moderate facet degenerative changes and ligamentum flavum redundancy resulting mild spinal canal stenosis with moderate narrowing of the bilateral subarticular zones, right greater than left, moderate right and mild left neural foraminal narrowing.   L4-5: Loss of disc height, disc bulge, moderate hypertrophic facet degenerative changes and ligamentum flavum redundancy resulting in mild-to-moderate spinal canal stenosis with moderate narrowing of the bilateral subarticular zones and moderate bilateral neural foraminal narrowing.   L5-S1: Shallow disc bulge and moderate facet degenerative changes without significant spinal canal or neural foraminal stenosis.   IMPRESSION: 1. Degenerative changes of the cervical spine without high-grade spinal canal stenosis at any level. 2. Moderate bilateral neural foraminal narrowing at C4-5. 3. Mild right and moderate to severe left neural  foraminal narrowing at C6-7. 4. Mild-to-moderate left neural foraminal  narrowing at C7-T1. 5. Moderate spinal canal stenosis with moderate right and severe left subarticular zone stenosis and moderate bilateral neural foraminal narrowing at L2-3. 6. Mild spinal canal stenosis with moderate narrowing of the bilateral subarticular zones, moderate right and mild left neural foraminal narrowing at L3-4. 7. Mild-to-moderate spinal canal stenosis with moderate narrowing of the bilateral subarticular zones and moderate bilateral neural foraminal narrowing at L4-5. 8. A 3 cm right thyroid lobe nodule. Recommend thyroid ultrasound for further evaluation.     Electronically Signed   By: Baldemar Lenis M.D.   On: 07/11/2023 10:15       PATIENT SURVEYS:  Modified Oswestry 52% (10/02/2023)   COGNITION: Overall cognitive status: Within functional limits for tasks assessed     SENSATION: Not tested    POSTURE: forward neck, B protracted shouldres, R knee slightly higher, R hip in ER, R lumbar convexity, forward flexed, decreased B hip extension, R lateral shift  Decreased low back pain with R lateral shift correction  PALPATION: TTP L greater trochanter L > R   B lumbar paraspinal muscle tenision R > L  TTP low back R > L L5 > L4 > L3 > L2  LUMBAR ROM:   AROM eval  Flexion WFL  Extension Limtied with L low back pain and L L5 dermatome symptoms to L lateral foot.   Right lateral flexion WFL with low back discomfort  Left lateral flexion WFL with low back discomfort  Right rotation full  Left rotation Limited with L L5 pain to foot   (Blank rows = not tested)  LOWER EXTREMITY ROM:     Passive  Right eval Left eval  Hip flexion    Hip extension    Hip abduction    Hip adduction    Hip internal rotation    Hip external rotation    Knee flexion    Knee extension    Ankle dorsiflexion    Ankle plantarflexion    Ankle inversion    Ankle eversion      (Blank rows = not tested)  LOWER EXTREMITY MMT:    MMT Right eval Left eval  Hip flexion 4 (with low back pain) 4- with L lateral hip pain  Hip extension 3- 3-  Hip abduction (seated manually resisted) 4 4-  Hip adduction    Hip internal rotation    Hip external rotation    Knee flexion 5 4  Knee extension 5 4+ with L L5 dermatome pain  Ankle dorsiflexion    Ankle plantarflexion    Ankle inversion    Ankle eversion     (Blank rows = not tested)  LUMBAR SPECIAL TESTS:  (+) repeated flexion test.   FUNCTIONAL TESTS:    GAIT: Distance walked: 30 ft Assistive device utilized: None Level of assistance: Complete Independence Comments: R lateral lean, decreased stance R LE, antalgic R LE  TREATMENT DATE: 11/08/2023  Neuromuscular re-education    HEP review: Seated R trunk side bend against chair back with towel roll at R lumbar convexity 10x3 with 5 second holds  Pt was recommended to take a break from the aforementioned exercise for a few days if R low back and hip soreness increased with the exercises. Pt verbalized understanding.    Sitting in upright neutral posture with R to L pressure to R lumbar convexity with strap                  Then with R trunk side bend 10x3 with 5 second holds                Then with transversus abdominis contraction 10x3 with 5 second holds    Standing split squat with contralateral UE assist   L 10x2  R 10x2  To promote glute max strengthening  Standing B shoulder extension with scapular retraction yellow band 10x5 seconds for 3 sets to promote trunk strength and muscle activation and thoracic extension  Standing with B UE assist  Hip abduction    R 10x     Improved technique, movement at target joints, use of target muscles after mod verbal, visual, tactile cues.         PATIENT EDUCATION:   Education details: There-ex, HEP Person educated: Patient Education method: Explanation, Demonstration, Tactile cues, Verbal cues, and Handouts Education comprehension: verbalized understanding and returned demonstration  HOME EXERCISE PROGRAM: Access Code: [email or web address] URL: [email or web address] Date: 10/09/2023 Prepared by: Loralyn Freshwater  Exercises - Supine Transversus Abdominis Bracing - Hands on Stomach  - 5 x daily - 7 x weekly - 3 sets - 10 reps - 5 seconds hold - Supine Posterior Pelvic Tilt  - 1 x daily - 7 x weekly - 3 sets - 10 reps - 5 seconds  hold  - Seated Gluteal Sets  - 3 x daily - 7 x weekly - 3 sets - 10 reps - 5 seconds hold - Seated Flexion Stretch  - 3 x daily - 7 x weekly - 1 sets - 10 reps - 10 seconds hold - Standing Sidebends  - 1 x daily - 7 x weekly - 3 sets - 10 reps - 5 seconds hold  To the R side  Seated R trunk side bend against chair back with towel roll at R lumbar convexity 10x3 with 5 second holds  Pt was recommended to take a break from the aforementioned exercise for a few days if R low back and hip soreness increased with the exercises. Pt verbalized understanding.    ASSESSMENT:  CLINICAL IMPRESSION:  Continued working on decreasing R lumbar convexity and pressure to L lumbar nerves as well as trunk strengthening to decrease stress to low back. Worked on improving glute max and med strength as well to decrease stress to low back during closed chain tasks. Improved back comfort level reported after tunk exercises. Pt will benefit from skilled physical therapy services to decrease pain, improve strength and function.        OBJECTIVE IMPAIRMENTS: difficulty walking, decreased strength, improper body mechanics, postural dysfunction, and pain.   ACTIVITY LIMITATIONS: carrying, lifting, bending, sitting, standing, stairs, and locomotion level  PARTICIPATION LIMITATIONS:   PERSONAL FACTORS: Age, Fitness, Past/current experiences, Time  since onset of injury/illness/exacerbation, and 3+ comorbidities: anxiety, arthritis, depression, hx of parotid gland tumor, seizures  are also affecting patient's functional outcome.   REHAB POTENTIAL: Fair    CLINICAL DECISION MAKING: Stable/uncomplicated  EVALUATION  COMPLEXITY: Low   GOALS: Goals reviewed with patient? Yes  SHORT TERM GOALS: Target date: 10/13/2023  Pt will be independent with her initial HEP to decrease pain, improve strength, function, and ability to perform tasks that involve standing, and walking more comfortably for her back.  Baseline:Pt has not yet started her initial HEP (10/02/2023); No questions, able to perform (11/08/2023) Goal status: MET   LONG TERM GOALS: Target date: 12/01/2023  Pt will have a decrease in low back pain to 5/10 or less at worst to promote ability to perform standing and walking tasks as well as negotiate stairs more comfortably.  Baseline: low back pain 10/10 at most for the past 3 months (10/02/2023); 8/10 at most (in the mornings) for the past 7 days. Gets better after taking the celebrex which she has been taking for the past 2 years (11/06/2023) Goal status: partially met  2.  Pt will have a decrease in L medial thigh pain to 3/10 or less at worst to promote ability to perform standing tasks as well as get into and out of vehicles more comfortably.   Baseline: L inner thigh pain 8/10 at worst for the past 3 months; 0/10 L inner thigh pain for the past 7 days (11/08/2023) Goal status: MET  3.  Pt will improve her B hip extension and abduction strength by at least 1/2 MMT grade to promote ability to perform standing tasks as well ambulate more comfortably for her back.  Baseline:  MMT Right eval Left eval  Hip extension 3- 3-  Hip abduction (seated manually resisted) 4 4-   (10/02/2023)   Goal status: INITIAL  4.  Pt will improve her Modified Oswestry Low Back Pain disability questionnaire score by at least 10% as a demonstration of  improved function.  Baseline: Modified Oswestry Low Back Pain disability questionnaire 52% (10/02/2023) Goal status: INITIAL    PLAN:  PT FREQUENCY: 1-2x/week  PT DURATION: 8 weeks  PLANNED INTERVENTIONS: 97110-Therapeutic exercises, 97530- Therapeutic activity, 97112- Neuromuscular re-education, 97535- Self Care, 40981- Manual therapy, [id number]- Aquatic Therapy, 97014- Electrical stimulation (unattended), [id number]- Traction (mechanical), [id number]- Ionotophoresis 4mg /ml Dexamethasone, Patient/Family education, Dry Needling, Joint mobilization, and Spinal mobilization.  PLAN FOR NEXT SESSION: posture, scapular, trunk, glute strengthening, lumbopelvic control, manual techniques, modalities PRN   Chas Axel, PT, DPT 11/08/2023, 11:52 AM

## 2023-11-13 ENCOUNTER — Ambulatory Visit: Payer: Medicare Other

## 2023-11-13 DIAGNOSIS — M5416 Radiculopathy, lumbar region: Secondary | ICD-10-CM | POA: Diagnosis not present

## 2023-11-13 DIAGNOSIS — M5459 Other low back pain: Secondary | ICD-10-CM | POA: Diagnosis not present

## 2023-11-13 NOTE — Therapy (Signed)
 OUTPATIENT PHYSICAL THERAPY TREATMENT   Patient Name: April Nguyen MRN: [id number] DOB:1951-04-03, 73 y.o., female Today's Date: 11/13/2023  END OF SESSION:  PT End of Session - 11/13/23 1119     Visit Number 9    Number of Visits 17    Date for PT Re-Evaluation 12/01/23    PT Start Time 1119    PT Stop Time 1158    PT Time Calculation (min) 39 min    Activity Tolerance Patient tolerated treatment well    Behavior During Therapy [facility] for tasks assessed/performed                     Past Medical History:  Diagnosis Date   Allergy    Seasonal   Anxiety    Arthritis    neck, wrists,hands, toes, and feet   COVID-19 09/08/2021   Depression    Eustachian tube dysfunction    Family history of adverse reaction to anesthesia    brother - PONV and combative   Genital warts    In past   GERD (gastroesophageal reflux disease)    History of shingles 04/16/2015   March 2016    history of Tumor of parotid gland    Hyperlipidemia    Meniere's disease    slight hearing loss, car sickness and dizziness   Migraines    sinus/stress   Monoclonal gammopathy    Polyarthralgia    PONV (postoperative nausea and vomiting)    and headaches and combative   Pre-diabetes    RLS (restless legs syndrome)    Seizures (HCC)    Dx 16 yrs ago - hormonal - none at least 3 yrs   Seizures (HCC)    H/O due to hormone issues   Past Surgical History:  Procedure Laterality Date   ABDOMINAL HYSTERECTOMY     CATARACT EXTRACTION Bilateral 01/2023   CHOLECYSTECTOMY  09/05/2006   dialation and cartarage  1982, 1999, 2000   DISTAL INTERPHALANGEAL JOINT FUSION Left 05/24/2022   Procedure: Left index and ring DIP fusion;  Surgeon: Kennedy Bucker, MD;  Location: [facility];  Service: Orthopedics;  Laterality: Left;   FINGER SURGERY  2011, 2012   Bone fusion of middle finger right and left hands   FINGER SURGERY  09/06/2011   Pin removed from left middle finger   GANGLION CYST  EXCISION  2011, 2012   Left and right hands   MYRINGOTOMY WITH TUBE PLACEMENT Bilateral 05/13/2015   Procedure: MYRINGOTOMY WITH  BUTTERFLY TUBE PLACEMENT;  Surgeon: Bud Face, MD;  Location: [facility];  Service: ENT;  Laterality: Bilateral;   MYRINGOTOMY WITH TUBE PLACEMENT Bilateral 08/28/2019   Procedure: MYRINGOTOMY WITH BUTTERFLY TUBE PLACEMENT;  Surgeon: Bud Face, MD;  Location: [facility];  Service: ENT;  Laterality: Bilateral;   PAROTID GLAND TUMOR EXCISION  09/05/1984   TONSILLECTOMY AND ADENOIDECTOMY  09/05/1957   TUBAL LIGATION  09/05/2008   Patient Active Problem List   Diagnosis Date Noted   Influenza A 10/16/2023   Chronic fatigue 02/27/2023   Hot flashes 02/27/2023   Monoclonal gammopathy 12/29/2021   Polyarthralgia 11/01/2021   Left foot pain 11/01/2021   Restless legs 03/09/2021   Prediabetes 02/26/2019   Chronic neck pain 02/26/2019   Hormone replacement therapy (HRT) 02/22/2018   Meniere disease 02/22/2018   Preventative health care 02/11/2016   Hyperlipidemia 02/11/2016   Insomnia 01/22/2016   Depression with anxiety 04/16/2015   GERD (gastroesophageal reflux disease) 04/16/2015   Arthritis  04/16/2015   ETD (eustachian tube dysfunction) 04/16/2015    PCP: Doreene Nest, NP   REFERRING PROVIDER: Venetia Night, MD   REFERRING DIAG: [phone] (ICD-10-CM) - Neurogenic claudication due to lumbar spinal stenosis  Rationale for Evaluation and Treatment: Rehabilitation  THERAPY DIAG:  Other low back pain  Radiculopathy, lumbar region  ONSET DATE: a year ago  SUBJECTIVE:                                                                                                                                                                                           SUBJECTIVE STATEMENT: Back is about a 4/10 stiffness, pt mopped yesterday, 6-7/10 when mopping. 8-9/10 yesterday morning when she woke up, pt was on her R side.  No L inner thigh pain yesterday but had L lateral thigh pain when mopping.     PERTINENT HISTORY:  Low back pain with neurogenic claudication. Has L LE symptoms. She also has L UE pain. Low back pain began about a year ago. Had an MRI for neck and low back. Was referred to Dr. Myer Haff who said that she had a pinched nerve in low back. Had 2 steroid injections for low back, the second injection (December 2024) helped. Has L R and L hip (greater trochanter area bilaterally). Had a steroid injection for L hip 3 months ago which helped.    No hx of osteoporosis No HTN per pt.  No latex allergies   PAIN:  Are you having pain? Yes: NPRS scale: 6/10 Pain location: low back and L inner thigh Pain description: achy, stiff Aggravating factors: stair negotiation, standing (for about 15-20 minutes), walking about 30 minutes in the grocery store, but sometimes a few minutes, sitting (for about 30 minutes) Relieving factors: heating pad , using a shopping cart, sitting in recliner with L leg hanging off to the side.   PRECAUTIONS: None  RED FLAGS: Bowel or bladder incontinence: Yes: both, and both Dr. Myer Haff (does not think it is due to her low back) and Dr. Mariah Milling aware and Cauda equina syndrome: Yes: L thigh tingling.     WEIGHT BEARING RESTRICTIONS: No  FALLS:  Has patient fallen in last 6 months? No  LIVING ENVIRONMENT: Lives with: lives with their spouse Lives in: House/apartment Stairs: Yes: External: 2 steps; none (side of the deck) Has following equipment at home: Grab bars  OCCUPATION: retired   PLOF: Independent  PATIENT GOALS: be able to mop the floor, go to sleep without tossing and turning, be able to walk and not hurt  NEXT MD VISIT: Not yet.  OBJECTIVE:  Note: Objective measures were completed at  Evaluation unless otherwise noted.  DIAGNOSTIC FINDINGS:  MR LUMBAR SPINE WO CONTRAST 06/24/2023  Narrative & Impression  CLINICAL DATA:  Cervicalgia.   Chronic  bilateral low back pain with left-sided sciatica.   EXAM: MRI CERVICAL AND LUMBAR SPINE WITHOUT CONTRAST   TECHNIQUE: Multiplanar and multiecho pulse sequences of the cervical spine, to include the craniocervical junction and cervicothoracic junction, and lumbar spine, were obtained without intravenous contrast.   COMPARISON:  Radiographs cervical spine February 26, 2019. CT cervical spine November 21, 2011.   No prior study of the lumbar spine.   FINDINGS: MRI CERVICAL SPINE FINDINGS   Alignment: Small anterolisthesis small anterolisthesis at C3-4, C4-5, C5-6 and C7-T1, similar to prior radiograph.   Vertebrae: No fracture, evidence of discitis, or bone lesion.   Cord: Normal signal and morphology.   Posterior Fossa, vertebral arteries, paraspinal tissues: A 3 cm right thyroid lobe nodule.   Disc levels:   C1-2: Left-sided joint effusion.   C2-3: Facet degenerative changes. No significant spinal canal or neural foraminal stenosis.   C3-4: Mild disc bulge without significant spinal canal stenosis. Uncovertebral and facet degenerative change resulting in mild bilateral neural foraminal narrowing.   C4-5: Small posterior disc protrusion without significant spinal canal stenosis. Uncovertebral and facet degenerative changes resulting in moderate bilateral neural foraminal narrowing.   C5-6: Small posterior disc osteophyte complex without significant spinal canal stenosis. Uncovertebral and prominent hypertrophic facet degenerative changes resulting in mild right neural foraminal narrowing.   C6-7: Posterior disc osteophyte complex without significant spinal canal stenosis. Uncovertebral and facet degenerative change resulting in mild right and moderate to severe left neural foraminal narrowing.   C7-T1: Facet degenerative changes resulting in mild-to-moderate left neural foraminal narrowing. No spinal canal stenosis.   MRI LUMBAR SPINE FINDINGS   Segmentation:   Standard segmentation is assumed.   Alignment:  Dextroconvex curvature.   Vertebrae: No acute fracture, evidence of discitis, or bone lesion. Chronic compression fractures of the L3 endplates with associated Schmorl nodes. Endplate degenerative changes throughout the lumbar spine, more pronounced at L4-5.   Conus medullaris and cauda equina: Conus extends to the L1 level. Conus and cauda equina appear normal.   Paraspinal and other soft tissues: Negative.   Disc levels:   T12-L1: No spinal canal or neural foraminal stenosis.   L1-2: Disc bulge with superiorly migrating left central disc extrusion and mild facet degenerative changes. Mild spinal canal stenosis with mild narrowing of the left subarticular zone and mild bilateral neural foraminal narrowing   L2-3: Loss of disc height, left asymmetric disc bulge, moderate facet degenerative changes and ligamentum flavum redundancy resulting in moderate spinal canal stenosis with moderate right and severe left subarticular zone stenosis and moderate bilateral neural foraminal narrowing.   L3-4: Loss of disc height, disc bulge, moderate facet degenerative changes and ligamentum flavum redundancy resulting mild spinal canal stenosis with moderate narrowing of the bilateral subarticular zones, right greater than left, moderate right and mild left neural foraminal narrowing.   L4-5: Loss of disc height, disc bulge, moderate hypertrophic facet degenerative changes and ligamentum flavum redundancy resulting in mild-to-moderate spinal canal stenosis with moderate narrowing of the bilateral subarticular zones and moderate bilateral neural foraminal narrowing.   L5-S1: Shallow disc bulge and moderate facet degenerative changes without significant spinal canal or neural foraminal stenosis.   IMPRESSION: 1. Degenerative changes of the cervical spine without high-grade spinal canal stenosis at any level. 2. Moderate bilateral neural  foraminal narrowing at C4-5. 3. Mild right and moderate  to severe left neural foraminal narrowing at C6-7. 4. Mild-to-moderate left neural foraminal narrowing at C7-T1. 5. Moderate spinal canal stenosis with moderate right and severe left subarticular zone stenosis and moderate bilateral neural foraminal narrowing at L2-3. 6. Mild spinal canal stenosis with moderate narrowing of the bilateral subarticular zones, moderate right and mild left neural foraminal narrowing at L3-4. 7. Mild-to-moderate spinal canal stenosis with moderate narrowing of the bilateral subarticular zones and moderate bilateral neural foraminal narrowing at L4-5. 8. A 3 cm right thyroid lobe nodule. Recommend thyroid ultrasound for further evaluation.     Electronically Signed   By: Baldemar Lenis M.D.   On: 07/11/2023 10:15       PATIENT SURVEYS:  Modified Oswestry 52% (10/02/2023)   COGNITION: Overall cognitive status: Within functional limits for tasks assessed     SENSATION: Not tested    POSTURE: forward neck, B protracted shouldres, R knee slightly higher, R hip in ER, R lumbar convexity, forward flexed, decreased B hip extension, R lateral shift  Decreased low back pain with R lateral shift correction  PALPATION: TTP L greater trochanter L > R   B lumbar paraspinal muscle tenision R > L  TTP low back R > L L5 > L4 > L3 > L2  LUMBAR ROM:   AROM eval  Flexion WFL  Extension Limtied with L low back pain and L L5 dermatome symptoms to L lateral foot.   Right lateral flexion WFL with low back discomfort  Left lateral flexion WFL with low back discomfort  Right rotation full  Left rotation Limited with L L5 pain to foot   (Blank rows = not tested)  LOWER EXTREMITY ROM:     Passive  Right eval Left eval  Hip flexion    Hip extension    Hip abduction    Hip adduction    Hip internal rotation    Hip external rotation    Knee flexion    Knee extension    Ankle  dorsiflexion    Ankle plantarflexion    Ankle inversion    Ankle eversion     (Blank rows = not tested)  LOWER EXTREMITY MMT:    MMT Right eval Left eval  Hip flexion 4 (with low back pain) 4- with L lateral hip pain  Hip extension 3- 3-  Hip abduction (seated manually resisted) 4 4-  Hip adduction    Hip internal rotation    Hip external rotation    Knee flexion 5 4  Knee extension 5 4+ with L L5 dermatome pain  Ankle dorsiflexion    Ankle plantarflexion    Ankle inversion    Ankle eversion     (Blank rows = not tested)  LUMBAR SPECIAL TESTS:  (+) repeated flexion test.   FUNCTIONAL TESTS:    GAIT: Distance walked: 30 ft Assistive device utilized: None Level of assistance: Complete Independence Comments: R lateral lean, decreased stance R LE, antalgic R LE  TREATMENT DATE: 11/13/2023  Therapeutic exercise  S/L clamshell  L 10x5 seconds for 3 sets   R 10x5 seconds for 2 sets, then 8x5 seconds. R glute med muscle muscle spasm. Eases with rest, massage and hooklying position.   S/L hip abduction   L 5x. Difficult  Hooklying hip adduction isometrics small blue ball squeeze 10x5 seconds for 2 sets  Hooklying posterior pelvic tilt 10x5 seconds     Then with march 10x3 each LE    Improved exercise technique, movement at target joints, use of target muscles after mod verbal, visual, tactile cues.     PATIENT EDUCATION:  Education details: There-ex, HEP Person educated: Patient Education method: Explanation, Demonstration, Tactile cues, Verbal cues, and Handouts Education comprehension: verbalized understanding and returned demonstration  HOME EXERCISE PROGRAM: Access Code: [email or web address] URL: [email or web address] Date: 10/09/2023 Prepared by: Loralyn Freshwater  Exercises - Supine Transversus Abdominis Bracing - Hands on  Stomach  - 5 x daily - 7 x weekly - 3 sets - 10 reps - 5 seconds hold - Supine Posterior Pelvic Tilt  - 1 x daily - 7 x weekly - 3 sets - 10 reps - 5 seconds  hold  - Seated Gluteal Sets  - 3 x daily - 7 x weekly - 3 sets - 10 reps - 5 seconds hold - Seated Flexion Stretch  - 3 x daily - 7 x weekly - 1 sets - 10 reps - 10 seconds hold - Standing Sidebends  - 1 x daily - 7 x weekly - 3 sets - 10 reps - 5 seconds hold  To the R side  Seated R trunk side bend against chair back with towel roll at R lumbar convexity 10x3 with 5 second holds  Pt was recommended to take a break from the aforementioned exercise for a few days if R low back and hip soreness increased with the exercises. Pt verbalized understanding.    - Clam  - 3-4 x daily - 7 x weekly - 2 sets - 10 reps - Supine March with Posterior Pelvic Tilt  - 1 x daily - 7 x weekly - 3 sets - 10 reps   ASSESSMENT:  CLINICAL IMPRESSION:  Worked on trunk, glute med strengthening and lumbopelvic control to help decrease stress to her low back when performing chores. Demonstrates weak B glute and trunk muscles. Pt tolerated session well without aggravation of symptoms. Pt will benefit from continued skilled physical therapy services to decrease pain, improve strength and function.        OBJECTIVE IMPAIRMENTS: difficulty walking, decreased strength, improper body mechanics, postural dysfunction, and pain.   ACTIVITY LIMITATIONS: carrying, lifting, bending, sitting, standing, stairs, and locomotion level  PARTICIPATION LIMITATIONS:   PERSONAL FACTORS: Age, Fitness, Past/current experiences, Time since onset of injury/illness/exacerbation, and 3+ comorbidities: anxiety, arthritis, depression, hx of parotid gland tumor, seizures  are also affecting patient's functional outcome.   REHAB POTENTIAL: Fair    CLINICAL DECISION MAKING: Stable/uncomplicated  EVALUATION COMPLEXITY: Low   GOALS: Goals reviewed with patient? Yes  SHORT TERM  GOALS: Target date: 10/13/2023  Pt will be independent with her initial HEP to decrease pain, improve strength, function, and ability to perform tasks that involve standing, and walking more comfortably for her back.  Baseline:Pt has not yet started her initial HEP (10/02/2023); No questions, able to perform (11/08/2023) Goal status: MET   LONG TERM GOALS: Target date: 12/01/2023  Pt will have a decrease in low back pain to 5/10 or less at worst to promote ability  to perform standing and walking tasks as well as negotiate stairs more comfortably.  Baseline: low back pain 10/10 at most for the past 3 months (10/02/2023); 8/10 at most (in the mornings) for the past 7 days. Gets better after taking the celebrex which she has been taking for the past 2 years (11/06/2023) Goal status: partially met  2.  Pt will have a decrease in L medial thigh pain to 3/10 or less at worst to promote ability to perform standing tasks as well as get into and out of vehicles more comfortably.   Baseline: L inner thigh pain 8/10 at worst for the past 3 months; 0/10 L inner thigh pain for the past 7 days (11/08/2023) Goal status: MET  3.  Pt will improve her B hip extension and abduction strength by at least 1/2 MMT grade to promote ability to perform standing tasks as well ambulate more comfortably for her back.  Baseline:  MMT Right eval Left eval  Hip extension 3- 3-  Hip abduction (seated manually resisted) 4 4-   (10/02/2023)   Goal status: INITIAL  4.  Pt will improve her Modified Oswestry Low Back Pain disability questionnaire score by at least 10% as a demonstration of improved function.  Baseline: Modified Oswestry Low Back Pain disability questionnaire 52% (10/02/2023); 28% (11/13/2023) Goal status: Met    PLAN:  PT FREQUENCY: 1-2x/week  PT DURATION: 8 weeks  PLANNED INTERVENTIONS: 97110-Therapeutic exercises, 97530- Therapeutic activity, 97112- Neuromuscular re-education, 97535- Self Care, 16109- Manual  therapy, [id number]- Aquatic Therapy, 97014- Electrical stimulation (unattended), [id number]- Traction (mechanical), [id number]- Ionotophoresis 4mg /ml Dexamethasone, Patient/Family education, Dry Needling, Joint mobilization, and Spinal mobilization.  PLAN FOR NEXT SESSION: posture, scapular, trunk, glute strengthening, lumbopelvic control, manual techniques, modalities PRN   Ryne Mctigue, PT, DPT 11/13/2023, 12:17 PM

## 2023-11-14 ENCOUNTER — Other Ambulatory Visit: Payer: Self-pay | Admitting: Primary Care

## 2023-11-14 DIAGNOSIS — E785 Hyperlipidemia, unspecified: Secondary | ICD-10-CM

## 2023-11-15 ENCOUNTER — Ambulatory Visit: Payer: Medicare Other

## 2023-11-20 ENCOUNTER — Ambulatory Visit: Payer: Medicare Other

## 2023-11-20 DIAGNOSIS — M5459 Other low back pain: Secondary | ICD-10-CM

## 2023-11-20 DIAGNOSIS — M5416 Radiculopathy, lumbar region: Secondary | ICD-10-CM | POA: Diagnosis not present

## 2023-11-20 NOTE — Therapy (Signed)
 OUTPATIENT PHYSICAL THERAPY TREATMENT And Progress Report (10/02/2023 - 11/20/2023)   Patient Name: April Nguyen MRN: [id number] DOB:May 27, 1951, 73 y.o., female Today's Date: 11/20/2023  END OF SESSION:  PT End of Session - 11/20/23 1122     Visit Number 10    Number of Visits 17    Date for PT Re-Evaluation 12/01/23    PT Start Time 1122    PT Stop Time 1202    PT Time Calculation (min) 40 min    Activity Tolerance Patient tolerated treatment well    Behavior During Therapy [facility] for tasks assessed/performed                      Past Medical History:  Diagnosis Date   Allergy    Seasonal   Anxiety    Arthritis    neck, wrists,hands, toes, and feet   COVID-19 09/08/2021   Depression    Eustachian tube dysfunction    Family history of adverse reaction to anesthesia    brother - PONV and combative   Genital warts    In past   GERD (gastroesophageal reflux disease)    History of shingles 04/16/2015   March 2016    history of Tumor of parotid gland    Hyperlipidemia    Meniere's disease    slight hearing loss, car sickness and dizziness   Migraines    sinus/stress   Monoclonal gammopathy    Polyarthralgia    PONV (postoperative nausea and vomiting)    and headaches and combative   Pre-diabetes    RLS (restless legs syndrome)    Seizures (HCC)    Dx 16 yrs ago - hormonal - none at least 3 yrs   Seizures (HCC)    H/O due to hormone issues   Past Surgical History:  Procedure Laterality Date   ABDOMINAL HYSTERECTOMY     CATARACT EXTRACTION Bilateral 01/2023   CHOLECYSTECTOMY  09/05/2006   dialation and cartarage  1982, 1999, 2000   DISTAL INTERPHALANGEAL JOINT FUSION Left 05/24/2022   Procedure: Left index and ring DIP fusion;  Surgeon: Kennedy Bucker, MD;  Location: [facility];  Service: Orthopedics;  Laterality: Left;   FINGER SURGERY  2011, 2012   Bone fusion of middle finger right and left hands   FINGER SURGERY  09/06/2011   Pin  removed from left middle finger   GANGLION CYST EXCISION  2011, 2012   Left and right hands   MYRINGOTOMY WITH TUBE PLACEMENT Bilateral 05/13/2015   Procedure: MYRINGOTOMY WITH  BUTTERFLY TUBE PLACEMENT;  Surgeon: Bud Face, MD;  Location: [facility];  Service: ENT;  Laterality: Bilateral;   MYRINGOTOMY WITH TUBE PLACEMENT Bilateral 08/28/2019   Procedure: MYRINGOTOMY WITH BUTTERFLY TUBE PLACEMENT;  Surgeon: Bud Face, MD;  Location: [facility];  Service: ENT;  Laterality: Bilateral;   PAROTID GLAND TUMOR EXCISION  09/05/1984   TONSILLECTOMY AND ADENOIDECTOMY  09/05/1957   TUBAL LIGATION  09/05/2008   Patient Active Problem List   Diagnosis Date Noted   Influenza A 10/16/2023   Chronic fatigue 02/27/2023   Hot flashes 02/27/2023   Monoclonal gammopathy 12/29/2021   Polyarthralgia 11/01/2021   Left foot pain 11/01/2021   Restless legs 03/09/2021   Prediabetes 02/26/2019   Chronic neck pain 02/26/2019   Hormone replacement therapy (HRT) 02/22/2018   Meniere disease 02/22/2018   Preventative health care 02/11/2016   Hyperlipidemia 02/11/2016   Insomnia 01/22/2016   Depression with anxiety 04/16/2015   GERD (  gastroesophageal reflux disease) 04/16/2015   Arthritis 04/16/2015   ETD (eustachian tube dysfunction) 04/16/2015    PCP: Doreene Nest, NP   REFERRING PROVIDER: Venetia Night, MD   REFERRING DIAG: [phone] (ICD-10-CM) - Neurogenic claudication due to lumbar spinal stenosis  Rationale for Evaluation and Treatment: Rehabilitation  THERAPY DIAG:  Other low back pain  Radiculopathy, lumbar region  ONSET DATE: a year ago  SUBJECTIVE:                                                                                                                                                                                           SUBJECTIVE STATEMENT: Back is ok, about a 4/10 currently as usual. L inner thigh is ok, has not had problems  with that. L foot has been numb a couple of times. R lateral hip has been bothering her. Sees a slight improvement with the standing and the walking.        PERTINENT HISTORY:  Low back pain with neurogenic claudication. Has L LE symptoms. She also has L UE pain. Low back pain began about a year ago. Had an MRI for neck and low back. Was referred to Dr. Myer Haff who said that she had a pinched nerve in low back. Had 2 steroid injections for low back, the second injection (December 2024) helped. Has L R and L hip (greater trochanter area bilaterally). Had a steroid injection for L hip 3 months ago which helped.    No hx of osteoporosis No HTN per pt.  No latex allergies   PAIN:  Are you having pain? Yes: NPRS scale: 6/10 Pain location: low back and L inner thigh Pain description: achy, stiff Aggravating factors: stair negotiation, standing (for about 15-20 minutes), walking about 30 minutes in the grocery store, but sometimes a few minutes, sitting (for about 30 minutes) Relieving factors: heating pad , using a shopping cart, sitting in recliner with L leg hanging off to the side.   PRECAUTIONS: None  RED FLAGS: Bowel or bladder incontinence: Yes: both, and both Dr. Myer Haff (does not think it is due to her low back) and Dr. Mariah Milling aware and Cauda equina syndrome: Yes: L thigh tingling.     WEIGHT BEARING RESTRICTIONS: No  FALLS:  Has patient fallen in last 6 months? No  LIVING ENVIRONMENT: Lives with: lives with their spouse Lives in: House/apartment Stairs: Yes: External: 2 steps; none (side of the deck) Has following equipment at home: Grab bars  OCCUPATION: retired   PLOF: Independent  PATIENT GOALS: be able to mop the floor, go to sleep without tossing and turning, be able to walk and  not hurt  NEXT MD VISIT: Not yet.  OBJECTIVE:  Note: Objective measures were completed at Evaluation unless otherwise noted.  DIAGNOSTIC FINDINGS:  MR LUMBAR SPINE WO CONTRAST  06/24/2023  Narrative & Impression  CLINICAL DATA:  Cervicalgia.   Chronic bilateral low back pain with left-sided sciatica.   EXAM: MRI CERVICAL AND LUMBAR SPINE WITHOUT CONTRAST   TECHNIQUE: Multiplanar and multiecho pulse sequences of the cervical spine, to include the craniocervical junction and cervicothoracic junction, and lumbar spine, were obtained without intravenous contrast.   COMPARISON:  Radiographs cervical spine February 26, 2019. CT cervical spine November 21, 2011.   No prior study of the lumbar spine.   FINDINGS: MRI CERVICAL SPINE FINDINGS   Alignment: Small anterolisthesis small anterolisthesis at C3-4, C4-5, C5-6 and C7-T1, similar to prior radiograph.   Vertebrae: No fracture, evidence of discitis, or bone lesion.   Cord: Normal signal and morphology.   Posterior Fossa, vertebral arteries, paraspinal tissues: A 3 cm right thyroid lobe nodule.   Disc levels:   C1-2: Left-sided joint effusion.   C2-3: Facet degenerative changes. No significant spinal canal or neural foraminal stenosis.   C3-4: Mild disc bulge without significant spinal canal stenosis. Uncovertebral and facet degenerative change resulting in mild bilateral neural foraminal narrowing.   C4-5: Small posterior disc protrusion without significant spinal canal stenosis. Uncovertebral and facet degenerative changes resulting in moderate bilateral neural foraminal narrowing.   C5-6: Small posterior disc osteophyte complex without significant spinal canal stenosis. Uncovertebral and prominent hypertrophic facet degenerative changes resulting in mild right neural foraminal narrowing.   C6-7: Posterior disc osteophyte complex without significant spinal canal stenosis. Uncovertebral and facet degenerative change resulting in mild right and moderate to severe left neural foraminal narrowing.   C7-T1: Facet degenerative changes resulting in mild-to-moderate left neural foraminal narrowing. No  spinal canal stenosis.   MRI LUMBAR SPINE FINDINGS   Segmentation:  Standard segmentation is assumed.   Alignment:  Dextroconvex curvature.   Vertebrae: No acute fracture, evidence of discitis, or bone lesion. Chronic compression fractures of the L3 endplates with associated Schmorl nodes. Endplate degenerative changes throughout the lumbar spine, more pronounced at L4-5.   Conus medullaris and cauda equina: Conus extends to the L1 level. Conus and cauda equina appear normal.   Paraspinal and other soft tissues: Negative.   Disc levels:   T12-L1: No spinal canal or neural foraminal stenosis.   L1-2: Disc bulge with superiorly migrating left central disc extrusion and mild facet degenerative changes. Mild spinal canal stenosis with mild narrowing of the left subarticular zone and mild bilateral neural foraminal narrowing   L2-3: Loss of disc height, left asymmetric disc bulge, moderate facet degenerative changes and ligamentum flavum redundancy resulting in moderate spinal canal stenosis with moderate right and severe left subarticular zone stenosis and moderate bilateral neural foraminal narrowing.   L3-4: Loss of disc height, disc bulge, moderate facet degenerative changes and ligamentum flavum redundancy resulting mild spinal canal stenosis with moderate narrowing of the bilateral subarticular zones, right greater than left, moderate right and mild left neural foraminal narrowing.   L4-5: Loss of disc height, disc bulge, moderate hypertrophic facet degenerative changes and ligamentum flavum redundancy resulting in mild-to-moderate spinal canal stenosis with moderate narrowing of the bilateral subarticular zones and moderate bilateral neural foraminal narrowing.   L5-S1: Shallow disc bulge and moderate facet degenerative changes without significant spinal canal or neural foraminal stenosis.   IMPRESSION: 1. Degenerative changes of the cervical spine without  high-grade spinal canal  stenosis at any level. 2. Moderate bilateral neural foraminal narrowing at C4-5. 3. Mild right and moderate to severe left neural foraminal narrowing at C6-7. 4. Mild-to-moderate left neural foraminal narrowing at C7-T1. 5. Moderate spinal canal stenosis with moderate right and severe left subarticular zone stenosis and moderate bilateral neural foraminal narrowing at L2-3. 6. Mild spinal canal stenosis with moderate narrowing of the bilateral subarticular zones, moderate right and mild left neural foraminal narrowing at L3-4. 7. Mild-to-moderate spinal canal stenosis with moderate narrowing of the bilateral subarticular zones and moderate bilateral neural foraminal narrowing at L4-5. 8. A 3 cm right thyroid lobe nodule. Recommend thyroid ultrasound for further evaluation.     Electronically Signed   By: Baldemar Lenis M.D.   On: 07/11/2023 10:15       PATIENT SURVEYS:  Modified Oswestry 52% (10/02/2023)   COGNITION: Overall cognitive status: Within functional limits for tasks assessed     SENSATION: Not tested    POSTURE: forward neck, B protracted shouldres, R knee slightly higher, R hip in ER, R lumbar convexity, forward flexed, decreased B hip extension, R lateral shift  Decreased low back pain with R lateral shift correction  PALPATION: TTP L greater trochanter L > R   B lumbar paraspinal muscle tenision R > L  TTP low back R > L L5 > L4 > L3 > L2  LUMBAR ROM:   AROM eval  Flexion WFL  Extension Limtied with L low back pain and L L5 dermatome symptoms to L lateral foot.   Right lateral flexion WFL with low back discomfort  Left lateral flexion WFL with low back discomfort  Right rotation full  Left rotation Limited with L L5 pain to foot   (Blank rows = not tested)  LOWER EXTREMITY ROM:     Passive  Right eval Left eval  Hip flexion    Hip extension    Hip abduction    Hip adduction    Hip internal rotation     Hip external rotation    Knee flexion    Knee extension    Ankle dorsiflexion    Ankle plantarflexion    Ankle inversion    Ankle eversion     (Blank rows = not tested)  LOWER EXTREMITY MMT:    MMT Right eval Left eval  Hip flexion 4 (with low back pain) 4- with L lateral hip pain  Hip extension 3- 3-  Hip abduction (seated manually resisted) 4 4-  Hip adduction    Hip internal rotation    Hip external rotation    Knee flexion 5 4  Knee extension 5 4+ with L L5 dermatome pain  Ankle dorsiflexion    Ankle plantarflexion    Ankle inversion    Ankle eversion     (Blank rows = not tested)  LUMBAR SPECIAL TESTS:  (+) repeated flexion test.   FUNCTIONAL TESTS:    GAIT: Distance walked: 30 ft Assistive device utilized: None Level of assistance: Complete Independence Comments: R lateral lean, decreased stance R LE, antalgic R LE  TREATMENT DATE: 11/20/2023  Therapeutic exercise  Seated manually resisted hip abduction, hip extension  Reviewed progress with PT towards goals  TTP R greater trochanter   Hooklying clamshell yellow band 10x3  Mini bridge with yellow band resisting hip abduction/ER 5x5  To promote trunk and glute max strength   Hooklying posterior pelvic tilt 10x10 seconds     Then with march 10x2 each LE  Static standing mini lunge with contralateral UE assist  R 10x2  L 10x2    Improved exercise technique, movement at target joints, use of target muscles after mod verbal, visual, tactile cues.     PATIENT EDUCATION:  Education details: There-ex, HEP Person educated: Patient Education method: Explanation, Demonstration, Tactile cues, Verbal cues, and Handouts Education comprehension: verbalized understanding and returned demonstration  HOME EXERCISE PROGRAM: Access Code: [email or web address] URL:  [email or web address] Date: 10/09/2023 Prepared by: Loralyn Freshwater  Exercises - Supine Transversus Abdominis Bracing - Hands on Stomach  - 5 x daily - 7 x weekly - 3 sets - 10 reps - 5 seconds hold - Supine Posterior Pelvic Tilt  - 1 x daily - 7 x weekly - 3 sets - 10 reps - 5 seconds  hold  - Seated Gluteal Sets  - 3 x daily - 7 x weekly - 3 sets - 10 reps - 5 seconds hold - Seated Flexion Stretch  - 3 x daily - 7 x weekly - 1 sets - 10 reps - 10 seconds hold - Standing Sidebends  - 1 x daily - 7 x weekly - 3 sets - 10 reps - 5 seconds hold  To the R side  Seated R trunk side bend against chair back with towel roll at R lumbar convexity 10x3 with 5 second holds  Pt was recommended to take a break from the aforementioned exercise for a few days if R low back and hip soreness increased with the exercises. Pt verbalized understanding.    - Clam  - 3-4 x daily - 7 x weekly - 2 sets - 10 reps  (Side lying, no band)   - Supine March with Posterior Pelvic Tilt  - 1 x daily - 7 x weekly - 3 sets - 10 reps - Hooklying Clamshell with Resistance  - 1 x daily - 7 x weekly - 3 sets - 10 reps  Yellow band    ASSESSMENT:  CLINICAL IMPRESSION:  Pt demonstrates overall decreased low back and L medial thigh pain, improved B hip strength and function since initial evaluation. Worst pain reported usually first thing in the morning after waking up, which decreases with increased movement suggesting stiffness. Continued working on improving trunk and glute strength to help decrease stress to low back. Also applied proper stress to R glute med tendon to promote healing (TTP R greater trochanter). No back pain reported after session.  Pt will benefit from continued skilled physical therapy services to decrease pain, improve strength and function.        OBJECTIVE IMPAIRMENTS: difficulty walking, decreased strength, improper body mechanics, postural dysfunction, and pain.   ACTIVITY  LIMITATIONS: carrying, lifting, bending, sitting, standing, stairs, and locomotion level  PARTICIPATION LIMITATIONS:   PERSONAL FACTORS: Age, Fitness, Past/current experiences, Time since onset of injury/illness/exacerbation, and 3+ comorbidities: anxiety, arthritis, depression, hx of parotid gland tumor, seizures  are also affecting patient's functional outcome.   REHAB POTENTIAL: Fair    CLINICAL DECISION MAKING: Stable/uncomplicated  EVALUATION COMPLEXITY: Low   GOALS: Goals reviewed with patient? Yes  SHORT TERM GOALS: Target date: 10/13/2023  Pt will be independent with her initial HEP to decrease pain, improve strength, function, and ability to perform tasks that involve standing, and walking more comfortably for her back.  Baseline:Pt has not yet started her initial HEP (10/02/2023); No questions, able to perform (11/08/2023) Goal status: MET   LONG TERM GOALS: Target date: 12/01/2023  Pt will have a decrease in low back pain to 5/10 or less at worst to promote ability to perform standing and walking tasks as well as negotiate stairs more comfortably.  Baseline: low back pain 10/10 at most for the past 3 months (10/02/2023); 8/10 at most (in the mornings) for the past 7 days. Gets better after taking the celebrex which she has been taking for the past 2 years (11/06/2023); 9/10 in the mornings, but feels better after movement. Other than first thing in the morning, 4-5/10 at worst (11/20/2023) Goal status: partially met  2.  Pt will have a decrease in L medial thigh pain to 3/10 or less at worst to promote ability to perform standing tasks as well as get into and out of vehicles more comfortably.   Baseline: L inner thigh pain 8/10 at worst for the past 3 months; 0/10 L inner thigh pain for the past 7 days (11/08/2023) Goal status: MET  3.  Pt will improve her B hip extension and abduction strength by at least 1/2 MMT grade to promote ability to perform standing tasks as well ambulate  more comfortably for her back.  Baseline:  MMT Right eval Left eval R (11/20/2023) L (11/20/2023)  Hip extension (seated manually resitsed) 3- 3- 3 3  Hip abduction (seated manually resisted) 4 4- 4 4   (10/02/2023)   Goal status: Partially met  4.  Pt will improve her Modified Oswestry Low Back Pain disability questionnaire score by at least 10% as a demonstration of improved function.  Baseline: Modified Oswestry Low Back Pain disability questionnaire 52% (10/02/2023); 28% (11/13/2023) Goal status: Met     PLAN:  PT FREQUENCY: 1-2x/week  PT DURATION: 8 weeks  PLANNED INTERVENTIONS: 97110-Therapeutic exercises, 97530- Therapeutic activity, 97112- Neuromuscular re-education, 97535- Self Care, 16109- Manual therapy, [id number]- Aquatic Therapy, 97014- Electrical stimulation (unattended), [id number]- Traction (mechanical), [id number]- Ionotophoresis 4mg /ml Dexamethasone, Patient/Family education, Dry Needling, Joint mobilization, and Spinal mobilization.  PLAN FOR NEXT SESSION: posture, scapular, trunk, glute strengthening, lumbopelvic control, manual techniques, modalities PRN  Thank you for your referral.  Jamauri Kruzel, PT, DPT 11/20/2023, 12:20 PM

## 2023-11-22 ENCOUNTER — Ambulatory Visit: Payer: Medicare Other

## 2023-11-22 DIAGNOSIS — M5416 Radiculopathy, lumbar region: Secondary | ICD-10-CM

## 2023-11-22 DIAGNOSIS — M5459 Other low back pain: Secondary | ICD-10-CM

## 2023-11-22 NOTE — Therapy (Signed)
 OUTPATIENT PHYSICAL THERAPY TREATMENT    Patient Name: ADELAE YODICE MRN: [id number] DOB:1951-06-25, 73 y.o., female Today's Date: 11/22/2023  END OF SESSION:  PT End of Session - 11/22/23 1120     Visit Number 11    Number of Visits 17    Date for PT Re-Evaluation 12/01/23    PT Start Time 1121    PT Stop Time 1201    PT Time Calculation (min) 40 min    Activity Tolerance Patient tolerated treatment well    Behavior During Therapy [facility] for tasks assessed/performed                       Past Medical History:  Diagnosis Date   Allergy    Seasonal   Anxiety    Arthritis    neck, wrists,hands, toes, and feet   COVID-19 09/08/2021   Depression    Eustachian tube dysfunction    Family history of adverse reaction to anesthesia    brother - PONV and combative   Genital warts    In past   GERD (gastroesophageal reflux disease)    History of shingles 04/16/2015   March 2016    history of Tumor of parotid gland    Hyperlipidemia    Meniere's disease    slight hearing loss, car sickness and dizziness   Migraines    sinus/stress   Monoclonal gammopathy    Polyarthralgia    PONV (postoperative nausea and vomiting)    and headaches and combative   Pre-diabetes    RLS (restless legs syndrome)    Seizures (HCC)    Dx 16 yrs ago - hormonal - none at least 3 yrs   Seizures (HCC)    H/O due to hormone issues   Past Surgical History:  Procedure Laterality Date   ABDOMINAL HYSTERECTOMY     CATARACT EXTRACTION Bilateral 01/2023   CHOLECYSTECTOMY  09/05/2006   dialation and cartarage  1982, 1999, 2000   DISTAL INTERPHALANGEAL JOINT FUSION Left 05/24/2022   Procedure: Left index and ring DIP fusion;  Surgeon: Kennedy Bucker, MD;  Location: [facility];  Service: Orthopedics;  Laterality: Left;   FINGER SURGERY  2011, 2012   Bone fusion of middle finger right and left hands   FINGER SURGERY  09/06/2011   Pin removed from left middle finger   GANGLION  CYST EXCISION  2011, 2012   Left and right hands   MYRINGOTOMY WITH TUBE PLACEMENT Bilateral 05/13/2015   Procedure: MYRINGOTOMY WITH  BUTTERFLY TUBE PLACEMENT;  Surgeon: Bud Face, MD;  Location: [facility];  Service: ENT;  Laterality: Bilateral;   MYRINGOTOMY WITH TUBE PLACEMENT Bilateral 08/28/2019   Procedure: MYRINGOTOMY WITH BUTTERFLY TUBE PLACEMENT;  Surgeon: Bud Face, MD;  Location: [facility];  Service: ENT;  Laterality: Bilateral;   PAROTID GLAND TUMOR EXCISION  09/05/1984   TONSILLECTOMY AND ADENOIDECTOMY  09/05/1957   TUBAL LIGATION  09/05/2008   Patient Active Problem List   Diagnosis Date Noted   Influenza A 10/16/2023   Chronic fatigue 02/27/2023   Hot flashes 02/27/2023   Monoclonal gammopathy 12/29/2021   Polyarthralgia 11/01/2021   Left foot pain 11/01/2021   Restless legs 03/09/2021   Prediabetes 02/26/2019   Chronic neck pain 02/26/2019   Hormone replacement therapy (HRT) 02/22/2018   Meniere disease 02/22/2018   Preventative health care 02/11/2016   Hyperlipidemia 02/11/2016   Insomnia 01/22/2016   Depression with anxiety 04/16/2015   GERD (gastroesophageal reflux disease) 04/16/2015  Arthritis 04/16/2015   ETD (eustachian tube dysfunction) 04/16/2015    PCP: Doreene Nest, NP   REFERRING PROVIDER: Venetia Night, MD   REFERRING DIAG: [phone] (ICD-10-CM) - Neurogenic claudication due to lumbar spinal stenosis  Rationale for Evaluation and Treatment: Rehabilitation  THERAPY DIAG:  Other low back pain  Radiculopathy, lumbar region  ONSET DATE: a year ago  SUBJECTIVE:                                                                                                                                                                                           SUBJECTIVE STATEMENT:  Back is ok, about a 4/10 currently. 6/10 back pain this morning. Walked about a mile yesterday, and back was a little sore, does not  remember if there was a pain number, must not have been too bad. Slept in her recliner 2 nights before and woke up that morning with back not being as stiff.          PERTINENT HISTORY:  Low back pain with neurogenic claudication. Has L LE symptoms. She also has L UE pain. Low back pain began about a year ago. Had an MRI for neck and low back. Was referred to Dr. Myer Haff who said that she had a pinched nerve in low back. Had 2 steroid injections for low back, the second injection (December 2024) helped. Has L R and L hip (greater trochanter area bilaterally). Had a steroid injection for L hip 3 months ago which helped.    No hx of osteoporosis No HTN per pt.  No latex allergies   PAIN:  Are you having pain? Yes: NPRS scale: 6/10 Pain location: low back and L inner thigh Pain description: achy, stiff Aggravating factors: stair negotiation, standing (for about 15-20 minutes), walking about 30 minutes in the grocery store, but sometimes a few minutes, sitting (for about 30 minutes) Relieving factors: heating pad , using a shopping cart, sitting in recliner with L leg hanging off to the side.   PRECAUTIONS: None  RED FLAGS: Bowel or bladder incontinence: Yes: both, and both Dr. Myer Haff (does not think it is due to her low back) and Dr. Mariah Milling aware and Cauda equina syndrome: Yes: L thigh tingling.     WEIGHT BEARING RESTRICTIONS: No  FALLS:  Has patient fallen in last 6 months? No  LIVING ENVIRONMENT: Lives with: lives with their spouse Lives in: House/apartment Stairs: Yes: External: 2 steps; none (side of the deck) Has following equipment at home: Grab bars  OCCUPATION: retired   PLOF: Independent  PATIENT GOALS: be able to mop the floor, go to sleep without tossing  and turning, be able to walk and not hurt  NEXT MD VISIT: Not yet.  OBJECTIVE:  Note: Objective measures were completed at Evaluation unless otherwise noted.  DIAGNOSTIC FINDINGS:  MR LUMBAR SPINE WO  CONTRAST 06/24/2023  Narrative & Impression  CLINICAL DATA:  Cervicalgia.   Chronic bilateral low back pain with left-sided sciatica.   EXAM: MRI CERVICAL AND LUMBAR SPINE WITHOUT CONTRAST   TECHNIQUE: Multiplanar and multiecho pulse sequences of the cervical spine, to include the craniocervical junction and cervicothoracic junction, and lumbar spine, were obtained without intravenous contrast.   COMPARISON:  Radiographs cervical spine February 26, 2019. CT cervical spine November 21, 2011.   No prior study of the lumbar spine.   FINDINGS: MRI CERVICAL SPINE FINDINGS   Alignment: Small anterolisthesis small anterolisthesis at C3-4, C4-5, C5-6 and C7-T1, similar to prior radiograph.   Vertebrae: No fracture, evidence of discitis, or bone lesion.   Cord: Normal signal and morphology.   Posterior Fossa, vertebral arteries, paraspinal tissues: A 3 cm right thyroid lobe nodule.   Disc levels:   C1-2: Left-sided joint effusion.   C2-3: Facet degenerative changes. No significant spinal canal or neural foraminal stenosis.   C3-4: Mild disc bulge without significant spinal canal stenosis. Uncovertebral and facet degenerative change resulting in mild bilateral neural foraminal narrowing.   C4-5: Small posterior disc protrusion without significant spinal canal stenosis. Uncovertebral and facet degenerative changes resulting in moderate bilateral neural foraminal narrowing.   C5-6: Small posterior disc osteophyte complex without significant spinal canal stenosis. Uncovertebral and prominent hypertrophic facet degenerative changes resulting in mild right neural foraminal narrowing.   C6-7: Posterior disc osteophyte complex without significant spinal canal stenosis. Uncovertebral and facet degenerative change resulting in mild right and moderate to severe left neural foraminal narrowing.   C7-T1: Facet degenerative changes resulting in mild-to-moderate left neural foraminal  narrowing. No spinal canal stenosis.   MRI LUMBAR SPINE FINDINGS   Segmentation:  Standard segmentation is assumed.   Alignment:  Dextroconvex curvature.   Vertebrae: No acute fracture, evidence of discitis, or bone lesion. Chronic compression fractures of the L3 endplates with associated Schmorl nodes. Endplate degenerative changes throughout the lumbar spine, more pronounced at L4-5.   Conus medullaris and cauda equina: Conus extends to the L1 level. Conus and cauda equina appear normal.   Paraspinal and other soft tissues: Negative.   Disc levels:   T12-L1: No spinal canal or neural foraminal stenosis.   L1-2: Disc bulge with superiorly migrating left central disc extrusion and mild facet degenerative changes. Mild spinal canal stenosis with mild narrowing of the left subarticular zone and mild bilateral neural foraminal narrowing   L2-3: Loss of disc height, left asymmetric disc bulge, moderate facet degenerative changes and ligamentum flavum redundancy resulting in moderate spinal canal stenosis with moderate right and severe left subarticular zone stenosis and moderate bilateral neural foraminal narrowing.   L3-4: Loss of disc height, disc bulge, moderate facet degenerative changes and ligamentum flavum redundancy resulting mild spinal canal stenosis with moderate narrowing of the bilateral subarticular zones, right greater than left, moderate right and mild left neural foraminal narrowing.   L4-5: Loss of disc height, disc bulge, moderate hypertrophic facet degenerative changes and ligamentum flavum redundancy resulting in mild-to-moderate spinal canal stenosis with moderate narrowing of the bilateral subarticular zones and moderate bilateral neural foraminal narrowing.   L5-S1: Shallow disc bulge and moderate facet degenerative changes without significant spinal canal or neural foraminal stenosis.   IMPRESSION: 1. Degenerative changes of the  cervical spine  without high-grade spinal canal stenosis at any level. 2. Moderate bilateral neural foraminal narrowing at C4-5. 3. Mild right and moderate to severe left neural foraminal narrowing at C6-7. 4. Mild-to-moderate left neural foraminal narrowing at C7-T1. 5. Moderate spinal canal stenosis with moderate right and severe left subarticular zone stenosis and moderate bilateral neural foraminal narrowing at L2-3. 6. Mild spinal canal stenosis with moderate narrowing of the bilateral subarticular zones, moderate right and mild left neural foraminal narrowing at L3-4. 7. Mild-to-moderate spinal canal stenosis with moderate narrowing of the bilateral subarticular zones and moderate bilateral neural foraminal narrowing at L4-5. 8. A 3 cm right thyroid lobe nodule. Recommend thyroid ultrasound for further evaluation.     Electronically Signed   By: Baldemar Lenis M.D.   On: 07/11/2023 10:15       PATIENT SURVEYS:  Modified Oswestry 52% (10/02/2023)   COGNITION: Overall cognitive status: Within functional limits for tasks assessed     SENSATION: Not tested    POSTURE: forward neck, B protracted shouldres, R knee slightly higher, R hip in ER, R lumbar convexity, forward flexed, decreased B hip extension, R lateral shift  Decreased low back pain with R lateral shift correction  PALPATION: TTP L greater trochanter L > R   B lumbar paraspinal muscle tenision R > L  TTP low back R > L L5 > L4 > L3 > L2  LUMBAR ROM:   AROM eval  Flexion WFL  Extension Limtied with L low back pain and L L5 dermatome symptoms to L lateral foot.   Right lateral flexion WFL with low back discomfort  Left lateral flexion WFL with low back discomfort  Right rotation full  Left rotation Limited with L L5 pain to foot   (Blank rows = not tested)  LOWER EXTREMITY ROM:     Passive  Right eval Left eval  Hip flexion    Hip extension    Hip abduction    Hip adduction    Hip internal  rotation    Hip external rotation    Knee flexion    Knee extension    Ankle dorsiflexion    Ankle plantarflexion    Ankle inversion    Ankle eversion     (Blank rows = not tested)  LOWER EXTREMITY MMT:    MMT Right eval Left eval  Hip flexion 4 (with low back pain) 4- with L lateral hip pain  Hip extension 3- 3-  Hip abduction (seated manually resisted) 4 4-  Hip adduction    Hip internal rotation    Hip external rotation    Knee flexion 5 4  Knee extension 5 4+ with L L5 dermatome pain  Ankle dorsiflexion    Ankle plantarflexion    Ankle inversion    Ankle eversion     (Blank rows = not tested)  LUMBAR SPECIAL TESTS:  (+) repeated flexion test.   FUNCTIONAL TESTS:    GAIT: Distance walked: 30 ft Assistive device utilized: None Level of assistance: Complete Independence Comments: R lateral lean, decreased stance R LE, antalgic R LE  TREATMENT DATE: 11/22/2023  Therapeutic exercise  Ascending 4 regular steps with B UE assist   R trunk side bend, R lateral lean when stepping up with R LE, difficulty.   Standing with B UE assist   Hip abduction    R 5x3   L 5x3    To promote single leg strength to decrease trunk lean for stair negotiation  Standing bent over onto elevated low mat table, resting onto forearms  Hip extension    R 5x3   L 5x3    Alternating shoulder flexion 5x3 each UE    Reclined  Hooklying    Hip extension isometrics, leg straight     R 10x3 with 5 second holds    L 10x3 with 5 second holds     Reviewed and given as part of her HEP. Pt demonstrated and verbalized understanding. Handout provided.       Improved exercise technique, movement at target joints, use of target muscles after mod verbal, visual, tactile cues.     PATIENT EDUCATION:  Education details: There-ex, HEP Person educated:  Patient Education method: Explanation, Demonstration, Tactile cues, Verbal cues, and Handouts Education comprehension: verbalized understanding and returned demonstration  HOME EXERCISE PROGRAM: Access Code: [email or web address] URL: [email or web address] Date: 10/09/2023 Prepared by: Loralyn Freshwater  Exercises - Supine Transversus Abdominis Bracing - Hands on Stomach  - 5 x daily - 7 x weekly - 3 sets - 10 reps - 5 seconds hold - Supine Posterior Pelvic Tilt  - 1 x daily - 7 x weekly - 3 sets - 10 reps - 5 seconds  hold  - Seated Gluteal Sets  - 3 x daily - 7 x weekly - 3 sets - 10 reps - 5 seconds hold - Seated Flexion Stretch  - 3 x daily - 7 x weekly - 1 sets - 10 reps - 10 seconds hold - Standing Sidebends  - 1 x daily - 7 x weekly - 3 sets - 10 reps - 5 seconds hold  To the R side  Seated R trunk side bend against chair back with towel roll at R lumbar convexity 10x3 with 5 second holds  Pt was recommended to take a break from the aforementioned exercise for a few days if R low back and hip soreness increased with the exercises. Pt verbalized understanding.    - Clam  - 3-4 x daily - 7 x weekly - 2 sets - 10 reps  (Side lying, no band)   - Supine March with Posterior Pelvic Tilt  - 1 x daily - 7 x weekly - 3 sets - 10 reps - Hooklying Clamshell with Resistance  - 1 x daily - 7 x weekly - 3 sets - 10 reps  Yellow band   - Standing Hip Abduction with Counter Support  - 3 x daily - 7 x weekly - 3 sets - 5 reps  Reclined  Hooklying    Hip extension isometrics, leg straight     R 10x3 with 5 second holds    L 10x3 with 5 second holds     Reviewed and given as part of her HEP. Pt demonstrated and verbalized understanding. Handout provided.       ASSESSMENT:  CLINICAL IMPRESSION:  Demonstrates trunk and glute weakness and decreased stability observed during stair negotiation with reproduction of back pain. Worked on trunk and glute med/max strength to help address. Good  muscle use felt reported during exercises. Pt will benefit from continued skilled physical therapy services to decrease  pain, improve strength and function.        OBJECTIVE IMPAIRMENTS: difficulty walking, decreased strength, improper body mechanics, postural dysfunction, and pain.   ACTIVITY LIMITATIONS: carrying, lifting, bending, sitting, standing, stairs, and locomotion level  PARTICIPATION LIMITATIONS:   PERSONAL FACTORS: Age, Fitness, Past/current experiences, Time since onset of injury/illness/exacerbation, and 3+ comorbidities: anxiety, arthritis, depression, hx of parotid gland tumor, seizures  are also affecting patient's functional outcome.   REHAB POTENTIAL: Fair    CLINICAL DECISION MAKING: Stable/uncomplicated  EVALUATION COMPLEXITY: Low   GOALS: Goals reviewed with patient? Yes  SHORT TERM GOALS: Target date: 10/13/2023  Pt will be independent with her initial HEP to decrease pain, improve strength, function, and ability to perform tasks that involve standing, and walking more comfortably for her back.  Baseline:Pt has not yet started her initial HEP (10/02/2023); No questions, able to perform (11/08/2023) Goal status: MET   LONG TERM GOALS: Target date: 12/01/2023  Pt will have a decrease in low back pain to 5/10 or less at worst to promote ability to perform standing and walking tasks as well as negotiate stairs more comfortably.  Baseline: low back pain 10/10 at most for the past 3 months (10/02/2023); 8/10 at most (in the mornings) for the past 7 days. Gets better after taking the celebrex which she has been taking for the past 2 years (11/06/2023); 9/10 in the mornings, but feels better after movement. Other than first thing in the morning, 4-5/10 at worst (11/20/2023) Goal status: partially met  2.  Pt will have a decrease in L medial thigh pain to 3/10 or less at worst to promote ability to perform standing tasks as well as get into and out of vehicles more  comfortably.   Baseline: L inner thigh pain 8/10 at worst for the past 3 months; 0/10 L inner thigh pain for the past 7 days (11/08/2023) Goal status: MET  3.  Pt will improve her B hip extension and abduction strength by at least 1/2 MMT grade to promote ability to perform standing tasks as well ambulate more comfortably for her back.  Baseline:  MMT Right eval Left eval R (11/20/2023) L (11/20/2023)  Hip extension (seated manually resitsed) 3- 3- 3 3  Hip abduction (seated manually resisted) 4 4- 4 4   (10/02/2023)   Goal status: Partially met  4.  Pt will improve her Modified Oswestry Low Back Pain disability questionnaire score by at least 10% as a demonstration of improved function.  Baseline: Modified Oswestry Low Back Pain disability questionnaire 52% (10/02/2023); 28% (11/13/2023) Goal status: Met     PLAN:  PT FREQUENCY: 1-2x/week  PT DURATION: 8 weeks  PLANNED INTERVENTIONS: 97110-Therapeutic exercises, 97530- Therapeutic activity, 97112- Neuromuscular re-education, 97535- Self Care, [id number]- Manual therapy, [id number]- Aquatic Therapy, 97014- Electrical stimulation (unattended), [id number]- Traction (mechanical), [id number]- Ionotophoresis 4mg /ml Dexamethasone, Patient/Family education, Dry Needling, Joint mobilization, and Spinal mobilization.  PLAN FOR NEXT SESSION: posture, scapular, trunk, glute strengthening, lumbopelvic control, manual techniques, modalities PRN   Jovan Schickling, PT, DPT 11/22/2023, 12:10 PM

## 2023-11-28 ENCOUNTER — Ambulatory Visit: Payer: Medicare Other

## 2023-11-28 DIAGNOSIS — M5416 Radiculopathy, lumbar region: Secondary | ICD-10-CM | POA: Diagnosis not present

## 2023-11-28 DIAGNOSIS — M5459 Other low back pain: Secondary | ICD-10-CM | POA: Diagnosis not present

## 2023-11-28 NOTE — Therapy (Signed)
 OUTPATIENT PHYSICAL THERAPY TREATMENT    Patient Name: April Nguyen MRN: [id number] DOB:[birth year], 73 y.o., female Today's Date: 11/28/2023  END OF SESSION:  PT End of Session - 11/28/23 1116     Visit Number 12    Number of Visits 17    Date for PT Re-Evaluation 12/01/23    PT Start Time 1116    PT Stop Time 1156    PT Time Calculation (min) 40 min    Activity Tolerance Patient tolerated treatment well    Behavior During Therapy [facility] for tasks assessed/performed                        Past Medical History:  Diagnosis Date   Allergy    Seasonal   Anxiety    Arthritis    neck, wrists,hands, toes, and feet   COVID-19 09/08/2021   Depression    Eustachian tube dysfunction    Family history of adverse reaction to anesthesia    brother - PONV and combative   Genital warts    In past   GERD (gastroesophageal reflux disease)    History of shingles 04/16/2015   March 2016    history of Tumor of parotid gland    Hyperlipidemia    Meniere's disease    slight hearing loss, car sickness and dizziness   Migraines    sinus/stress   Monoclonal gammopathy    Polyarthralgia    PONV (postoperative nausea and vomiting)    and headaches and combative   Pre-diabetes    RLS (restless legs syndrome)    Seizures (HCC)    Dx 16 yrs ago - hormonal - none at least 3 yrs   Seizures (HCC)    H/O due to hormone issues   Past Surgical History:  Procedure Laterality Date   ABDOMINAL HYSTERECTOMY     CATARACT EXTRACTION Bilateral 01/2023   CHOLECYSTECTOMY  09/05/2006   dialation and cartarage  1982, 1999, 2000   DISTAL INTERPHALANGEAL JOINT FUSION Left 05/24/2022   Procedure: Left index and ring DIP fusion;  Surgeon: Kennedy Bucker, MD;  Location: [facility];  Service: Orthopedics;  Laterality: Left;   FINGER SURGERY  2011, 2012   Bone fusion of middle finger right and left hands   FINGER SURGERY  09/06/2011   Pin removed from left middle finger   GANGLION  CYST EXCISION  2011, 2012   Left and right hands   MYRINGOTOMY WITH TUBE PLACEMENT Bilateral 05/13/2015   Procedure: MYRINGOTOMY WITH  BUTTERFLY TUBE PLACEMENT;  Surgeon: Bud Face, MD;  Location: [facility];  Service: ENT;  Laterality: Bilateral;   MYRINGOTOMY WITH TUBE PLACEMENT Bilateral 08/28/2019   Procedure: MYRINGOTOMY WITH BUTTERFLY TUBE PLACEMENT;  Surgeon: Bud Face, MD;  Location: [facility];  Service: ENT;  Laterality: Bilateral;   PAROTID GLAND TUMOR EXCISION  09/05/1984   TONSILLECTOMY AND ADENOIDECTOMY  09/05/1957   TUBAL LIGATION  09/05/2008   Patient Active Problem List   Diagnosis Date Noted   Influenza A 10/16/2023   Chronic fatigue 02/27/2023   Hot flashes 02/27/2023   Monoclonal gammopathy 12/29/2021   Polyarthralgia 11/01/2021   Left foot pain 11/01/2021   Restless legs 03/09/2021   Prediabetes 02/26/2019   Chronic neck pain 02/26/2019   Hormone replacement therapy (HRT) 02/22/2018   Meniere disease 02/22/2018   Preventative health care 02/11/2016   Hyperlipidemia 02/11/2016   Insomnia 01/22/2016   Depression with anxiety 04/16/2015   GERD (gastroesophageal reflux disease)  04/16/2015   Arthritis 04/16/2015   ETD (eustachian tube dysfunction) 04/16/2015    PCP: Doreene Nest, NP   REFERRING PROVIDER: Venetia Night, MD   REFERRING DIAG: [phone] (ICD-10-CM) - Neurogenic claudication due to lumbar spinal stenosis  Rationale for Evaluation and Treatment: Rehabilitation  THERAPY DIAG:  Other low back pain  Radiculopathy, lumbar region  ONSET DATE: a year ago  SUBJECTIVE:                                                                                                                                                                                           SUBJECTIVE STATEMENT: Back is about a 7/10 currently. Does not know if it is just tight. Did some walking yesterday doing errands, does not normally do that  much walking. Was walking humped over this morning. Did her standing hip abduction exercise this morning.      PERTINENT HISTORY:  Low back pain with neurogenic claudication. Has L LE symptoms. She also has L UE pain. Low back pain began about a year ago. Had an MRI for neck and low back. Was referred to Dr. Myer Haff who said that she had a pinched nerve in low back. Had 2 steroid injections for low back, the second injection (December 2024) helped. Has L R and L hip (greater trochanter area bilaterally). Had a steroid injection for L hip 3 months ago which helped.    No hx of osteoporosis No HTN per pt.  No latex allergies   PAIN:  Are you having pain? Yes: NPRS scale: 6/10 Pain location: low back and L inner thigh Pain description: achy, stiff Aggravating factors: stair negotiation, standing (for about 15-20 minutes), walking about 30 minutes in the grocery store, but sometimes a few minutes, sitting (for about 30 minutes) Relieving factors: heating pad , using a shopping cart, sitting in recliner with L leg hanging off to the side.   PRECAUTIONS: None  RED FLAGS: Bowel or bladder incontinence: Yes: both, and both Dr. Myer Haff (does not think it is due to her low back) and Dr. Mariah Milling aware and Cauda equina syndrome: Yes: L thigh tingling.     WEIGHT BEARING RESTRICTIONS: No  FALLS:  Has patient fallen in last 6 months? No  LIVING ENVIRONMENT: Lives with: lives with their spouse Lives in: House/apartment Stairs: Yes: External: 2 steps; none (side of the deck) Has following equipment at home: Grab bars  OCCUPATION: retired   PLOF: Independent  PATIENT GOALS: be able to mop the floor, go to sleep without tossing and turning, be able to walk and not hurt  NEXT MD VISIT: Not yet.  OBJECTIVE:  Note: Objective measures were completed at Evaluation unless otherwise noted.  DIAGNOSTIC FINDINGS:  MR LUMBAR SPINE WO CONTRAST 06/24/2023  Narrative & Impression  CLINICAL  DATA:  Cervicalgia.   Chronic bilateral low back pain with left-sided sciatica.   EXAM: MRI CERVICAL AND LUMBAR SPINE WITHOUT CONTRAST   TECHNIQUE: Multiplanar and multiecho pulse sequences of the cervical spine, to include the craniocervical junction and cervicothoracic junction, and lumbar spine, were obtained without intravenous contrast.   COMPARISON:  Radiographs cervical spine February 26, 2019. CT cervical spine November 21, 2011.   No prior study of the lumbar spine.   FINDINGS: MRI CERVICAL SPINE FINDINGS   Alignment: Small anterolisthesis small anterolisthesis at C3-4, C4-5, C5-6 and C7-T1, similar to prior radiograph.   Vertebrae: No fracture, evidence of discitis, or bone lesion.   Cord: Normal signal and morphology.   Posterior Fossa, vertebral arteries, paraspinal tissues: A 3 cm right thyroid lobe nodule.   Disc levels:   C1-2: Left-sided joint effusion.   C2-3: Facet degenerative changes. No significant spinal canal or neural foraminal stenosis.   C3-4: Mild disc bulge without significant spinal canal stenosis. Uncovertebral and facet degenerative change resulting in mild bilateral neural foraminal narrowing.   C4-5: Small posterior disc protrusion without significant spinal canal stenosis. Uncovertebral and facet degenerative changes resulting in moderate bilateral neural foraminal narrowing.   C5-6: Small posterior disc osteophyte complex without significant spinal canal stenosis. Uncovertebral and prominent hypertrophic facet degenerative changes resulting in mild right neural foraminal narrowing.   C6-7: Posterior disc osteophyte complex without significant spinal canal stenosis. Uncovertebral and facet degenerative change resulting in mild right and moderate to severe left neural foraminal narrowing.   C7-T1: Facet degenerative changes resulting in mild-to-moderate left neural foraminal narrowing. No spinal canal stenosis.   MRI LUMBAR SPINE  FINDINGS   Segmentation:  Standard segmentation is assumed.   Alignment:  Dextroconvex curvature.   Vertebrae: No acute fracture, evidence of discitis, or bone lesion. Chronic compression fractures of the L3 endplates with associated Schmorl nodes. Endplate degenerative changes throughout the lumbar spine, more pronounced at L4-5.   Conus medullaris and cauda equina: Conus extends to the L1 level. Conus and cauda equina appear normal.   Paraspinal and other soft tissues: Negative.   Disc levels:   T12-L1: No spinal canal or neural foraminal stenosis.   L1-2: Disc bulge with superiorly migrating left central disc extrusion and mild facet degenerative changes. Mild spinal canal stenosis with mild narrowing of the left subarticular zone and mild bilateral neural foraminal narrowing   L2-3: Loss of disc height, left asymmetric disc bulge, moderate facet degenerative changes and ligamentum flavum redundancy resulting in moderate spinal canal stenosis with moderate right and severe left subarticular zone stenosis and moderate bilateral neural foraminal narrowing.   L3-4: Loss of disc height, disc bulge, moderate facet degenerative changes and ligamentum flavum redundancy resulting mild spinal canal stenosis with moderate narrowing of the bilateral subarticular zones, right greater than left, moderate right and mild left neural foraminal narrowing.   L4-5: Loss of disc height, disc bulge, moderate hypertrophic facet degenerative changes and ligamentum flavum redundancy resulting in mild-to-moderate spinal canal stenosis with moderate narrowing of the bilateral subarticular zones and moderate bilateral neural foraminal narrowing.   L5-S1: Shallow disc bulge and moderate facet degenerative changes without significant spinal canal or neural foraminal stenosis.   IMPRESSION: 1. Degenerative changes of the cervical spine without high-grade spinal canal stenosis at any level. 2.  Moderate bilateral neural foraminal narrowing at  C4-5. 3. Mild right and moderate to severe left neural foraminal narrowing at C6-7. 4. Mild-to-moderate left neural foraminal narrowing at C7-T1. 5. Moderate spinal canal stenosis with moderate right and severe left subarticular zone stenosis and moderate bilateral neural foraminal narrowing at L2-3. 6. Mild spinal canal stenosis with moderate narrowing of the bilateral subarticular zones, moderate right and mild left neural foraminal narrowing at L3-4. 7. Mild-to-moderate spinal canal stenosis with moderate narrowing of the bilateral subarticular zones and moderate bilateral neural foraminal narrowing at L4-5. 8. A 3 cm right thyroid lobe nodule. Recommend thyroid ultrasound for further evaluation.     Electronically Signed   By: Baldemar Lenis M.D.   On: 07/11/2023 10:15       PATIENT SURVEYS:  Modified Oswestry 52% (10/02/2023)   COGNITION: Overall cognitive status: Within functional limits for tasks assessed     SENSATION: Not tested    POSTURE: forward neck, B protracted shouldres, R knee slightly higher, R hip in ER, R lumbar convexity, forward flexed, decreased B hip extension, R lateral shift  Decreased low back pain with R lateral shift correction  PALPATION: TTP L greater trochanter L > R   B lumbar paraspinal muscle tenision R > L  TTP low back R > L L5 > L4 > L3 > L2  LUMBAR ROM:   AROM eval  Flexion WFL  Extension Limtied with L low back pain and L L5 dermatome symptoms to L lateral foot.   Right lateral flexion WFL with low back discomfort  Left lateral flexion WFL with low back discomfort  Right rotation full  Left rotation Limited with L L5 pain to foot   (Blank rows = not tested)  LOWER EXTREMITY ROM:     Passive  Right eval Left eval  Hip flexion    Hip extension    Hip abduction    Hip adduction    Hip internal rotation    Hip external rotation    Knee flexion    Knee  extension    Ankle dorsiflexion    Ankle plantarflexion    Ankle inversion    Ankle eversion     (Blank rows = not tested)  LOWER EXTREMITY MMT:    MMT Right eval Left eval  Hip flexion 4 (with low back pain) 4- with L lateral hip pain  Hip extension 3- 3-  Hip abduction (seated manually resisted) 4 4-  Hip adduction    Hip internal rotation    Hip external rotation    Knee flexion 5 4  Knee extension 5 4+ with L L5 dermatome pain  Ankle dorsiflexion    Ankle plantarflexion    Ankle inversion    Ankle eversion     (Blank rows = not tested)  LUMBAR SPECIAL TESTS:  (+) repeated flexion test.   FUNCTIONAL TESTS:    GAIT: Distance walked: 30 ft Assistive device utilized: None Level of assistance: Complete Independence Comments: R lateral lean, decreased stance R LE, antalgic R LE  TREATMENT DATE: 11/28/2023  Therapeutic exercise  Seated transversus abdominis contraction 10x2 with 5 second holds  Standing B shoulder extension yellow band with scapular retraction 10x5 seconds for 2 sets  Decreased low back tightness reported after the aforementioned exercises.   Side stepping 20 ft to the R and 20 ft to the L to promote glute med strengthening  Forward wedding march 20 ft x 2 to promote glute med muscle strengthening   CGA secondary to some unsteadiness from Menieres' per pt   Standing bent over onto elevated low mat table, resting onto forearms  Hip extension    R 5x3   L 5x3   Alternating shoulder flexion 5x3 each UE   Standing B shoulder extension yellow band with scapular retraction 10x5 seconds   Standing pallof press yellow band with handle  L 10x5 seconds   R 10x5 seconds          Improved exercise technique, movement at target joints, use of target muscles after mod verbal, visual, tactile cues.     PATIENT  EDUCATION:  Education details: There-ex, HEP Person educated: Patient Education method: Explanation, Demonstration, Tactile cues, Verbal cues, and Handouts Education comprehension: verbalized understanding and returned demonstration  HOME EXERCISE PROGRAM: Access Code: [email or web address] URL: [email or web address] Date: 11/28/2023 Prepared by: Loralyn Freshwater  Exercises - Seated Flexion Stretch  - 3 x daily - 7 x weekly - 1 sets - 10 reps - 10 seconds hold - Standing Sidebends  - 1 x daily - 7 x weekly - 3 sets - 10 reps - 5 seconds hold - Supine March with Posterior Pelvic Tilt  - 1 x daily - 7 x weekly - 3 sets - 10 reps - Hooklying Clamshell with Resistance  - 1 x daily - 7 x weekly - 3 sets - 10 reps - Standing Hip Abduction with Counter Support  - 3 x daily - 7 x weekly - 3 sets - 5 reps - Seated Transversus Abdominis Bracing  - 5 x daily - 7 x weekly - 3 sets - 10 reps - 5 seconds hold  Reclined  Hooklying    Hip extension isometrics, leg straight     R 10x3 with 5 second holds    L 10x3 with 5 second holds     Reviewed and given as part of her HEP. Pt demonstrated and verbalized understanding. Handout provided.       ASSESSMENT:  CLINICAL IMPRESSION:  Continued working on improving trunk and glute med and max muscle strengthening secondary to weakness. Decreased B lumbar paraspinal muscle tension palpated with trunk strengthening. Decreased back pain and tension reported after session. Pt will benefit from continued skilled physical therapy services to decrease pain, improve strength and function.        OBJECTIVE IMPAIRMENTS: difficulty walking, decreased strength, improper body mechanics, postural dysfunction, and pain.   ACTIVITY LIMITATIONS: carrying, lifting, bending, sitting, standing, stairs, and locomotion level  PARTICIPATION LIMITATIONS:   PERSONAL FACTORS: Age, Fitness, Past/current experiences, Time since onset of injury/illness/exacerbation, and 3+  comorbidities: anxiety, arthritis, depression, hx of parotid gland tumor, seizures  are also affecting patient's functional outcome.   REHAB POTENTIAL: Fair    CLINICAL DECISION MAKING: Stable/uncomplicated  EVALUATION COMPLEXITY: Low   GOALS: Goals reviewed with patient? Yes  SHORT TERM GOALS: Target date: 10/13/2023  Pt will be independent with her initial HEP to decrease pain, improve strength, function, and ability to perform tasks that involve standing, and walking more comfortably for her back.  Baseline:Pt has not yet started  her initial HEP (10/02/2023); No questions, able to perform (11/08/2023) Goal status: MET   LONG TERM GOALS: Target date: 12/01/2023  Pt will have a decrease in low back pain to 5/10 or less at worst to promote ability to perform standing and walking tasks as well as negotiate stairs more comfortably.  Baseline: low back pain 10/10 at most for the past 3 months (10/02/2023); 8/10 at most (in the mornings) for the past 7 days. Gets better after taking the celebrex which she has been taking for the past 2 years (11/06/2023); 9/10 in the mornings, but feels better after movement. Other than first thing in the morning, 4-5/10 at worst (11/20/2023) Goal status: partially met  2.  Pt will have a decrease in L medial thigh pain to 3/10 or less at worst to promote ability to perform standing tasks as well as get into and out of vehicles more comfortably.   Baseline: L inner thigh pain 8/10 at worst for the past 3 months; 0/10 L inner thigh pain for the past 7 days (11/08/2023) Goal status: MET  3.  Pt will improve her B hip extension and abduction strength by at least 1/2 MMT grade to promote ability to perform standing tasks as well ambulate more comfortably for her back.  Baseline:  MMT Right eval Left eval R (11/20/2023) L (11/20/2023)  Hip extension (seated manually resitsed) 3- 3- 3 3  Hip abduction (seated manually resisted) 4 4- 4 4   (10/02/2023)   Goal status:  Partially met  4.  Pt will improve her Modified Oswestry Low Back Pain disability questionnaire score by at least 10% as a demonstration of improved function.  Baseline: Modified Oswestry Low Back Pain disability questionnaire 52% (10/02/2023); 28% (11/13/2023) Goal status: Met     PLAN:  PT FREQUENCY: 1-2x/week  PT DURATION: 8 weeks  PLANNED INTERVENTIONS: 97110-Therapeutic exercises, 97530- Therapeutic activity, 97112- Neuromuscular re-education, 97535- Self Care, 28413- Manual therapy, [id number]- Aquatic Therapy, 97014- Electrical stimulation (unattended), [id number]- Traction (mechanical), [id number]- Ionotophoresis 4mg /ml Dexamethasone, Patient/Family education, Dry Needling, Joint mobilization, and Spinal mobilization.  PLAN FOR NEXT SESSION: posture, scapular, trunk, glute strengthening, lumbopelvic control, manual techniques, modalities PRN   Mi Balla, PT, DPT 11/28/2023, 12:04 PM

## 2023-11-30 ENCOUNTER — Ambulatory Visit: Payer: Medicare Other

## 2023-11-30 DIAGNOSIS — M5416 Radiculopathy, lumbar region: Secondary | ICD-10-CM | POA: Diagnosis not present

## 2023-11-30 DIAGNOSIS — M5459 Other low back pain: Secondary | ICD-10-CM | POA: Diagnosis not present

## 2023-11-30 NOTE — Addendum Note (Signed)
 Addended by: Charlene Brooke on: 11/30/2023 12:27 PM   Modules accepted: Orders

## 2023-11-30 NOTE — Therapy (Addendum)
 OUTPATIENT PHYSICAL THERAPY TREATMENT    Patient Name: MARQUISA SALIH MRN: [id number] DOB:01/29/1951, 73 y.o., female Today's Date: 11/30/2023  END OF SESSION:  PT End of Session - 11/30/23 1125     Visit Number 13    Number of Visits 25    Date for PT Re-Evaluation 01/12/24    PT Start Time 1125    PT Stop Time 1204    PT Time Calculation (min) 39 min    Activity Tolerance Patient tolerated treatment well    Behavior During Therapy [facility] for tasks assessed/performed                         Past Medical History:  Diagnosis Date   Allergy    Seasonal   Anxiety    Arthritis    neck, wrists,hands, toes, and feet   COVID-19 09/08/2021   Depression    Eustachian tube dysfunction    Family history of adverse reaction to anesthesia    brother - PONV and combative   Genital warts    In past   GERD (gastroesophageal reflux disease)    History of shingles 04/16/2015   March 2016    history of Tumor of parotid gland    Hyperlipidemia    Meniere's disease    slight hearing loss, car sickness and dizziness   Migraines    sinus/stress   Monoclonal gammopathy    Polyarthralgia    PONV (postoperative nausea and vomiting)    and headaches and combative   Pre-diabetes    RLS (restless legs syndrome)    Seizures (HCC)    Dx 16 yrs ago - hormonal - none at least 3 yrs   Seizures (HCC)    H/O due to hormone issues   Past Surgical History:  Procedure Laterality Date   ABDOMINAL HYSTERECTOMY     CATARACT EXTRACTION Bilateral 01/2023   CHOLECYSTECTOMY  09/05/2006   dialation and cartarage  1982, 1999, 2000   DISTAL INTERPHALANGEAL JOINT FUSION Left 05/24/2022   Procedure: Left index and ring DIP fusion;  Surgeon: Kennedy Bucker, MD;  Location: [facility];  Service: Orthopedics;  Laterality: Left;   FINGER SURGERY  2011, 2012   Bone fusion of middle finger right and left hands   FINGER SURGERY  09/06/2011   Pin removed from left middle finger    GANGLION CYST EXCISION  2011, 2012   Left and right hands   MYRINGOTOMY WITH TUBE PLACEMENT Bilateral 05/13/2015   Procedure: MYRINGOTOMY WITH  BUTTERFLY TUBE PLACEMENT;  Surgeon: Bud Face, MD;  Location: [facility];  Service: ENT;  Laterality: Bilateral;   MYRINGOTOMY WITH TUBE PLACEMENT Bilateral 08/28/2019   Procedure: MYRINGOTOMY WITH BUTTERFLY TUBE PLACEMENT;  Surgeon: Bud Face, MD;  Location: [facility];  Service: ENT;  Laterality: Bilateral;   PAROTID GLAND TUMOR EXCISION  09/05/1984   TONSILLECTOMY AND ADENOIDECTOMY  09/05/1957   TUBAL LIGATION  09/05/2008   Patient Active Problem List   Diagnosis Date Noted   Influenza A 10/16/2023   Chronic fatigue 02/27/2023   Hot flashes 02/27/2023   Monoclonal gammopathy 12/29/2021   Polyarthralgia 11/01/2021   Left foot pain 11/01/2021   Restless legs 03/09/2021   Prediabetes 02/26/2019   Chronic neck pain 02/26/2019   Hormone replacement therapy (HRT) 02/22/2018   Meniere disease 02/22/2018   Preventative health care 02/11/2016   Hyperlipidemia 02/11/2016   Insomnia 01/22/2016   Depression with anxiety 04/16/2015   GERD (gastroesophageal reflux  disease) 04/16/2015   Arthritis 04/16/2015   ETD (eustachian tube dysfunction) 04/16/2015    PCP: Doreene Nest, NP   REFERRING PROVIDER: Venetia Night, MD   REFERRING DIAG: [phone] (ICD-10-CM) - Neurogenic claudication due to lumbar spinal stenosis  Rationale for Evaluation and Treatment: Rehabilitation  THERAPY DIAG:  Other low back pain  Radiculopathy, lumbar region  ONSET DATE: a year ago  SUBJECTIVE:                                                                                                                                                                                           SUBJECTIVE STATEMENT: Back is ok, about a 4/10 currently. Was not feeling good with the meniere's last session. Mopping and the walking still  bothers her back. Able to step up onto a curb without anyone helping her.        PERTINENT HISTORY:  Low back pain with neurogenic claudication. Has L LE symptoms. She also has L UE pain. Low back pain began about a year ago. Had an MRI for neck and low back. Was referred to Dr. Myer Haff who said that she had a pinched nerve in low back. Had 2 steroid injections for low back, the second injection (December 2024) helped. Has L R and L hip (greater trochanter area bilaterally). Had a steroid injection for L hip 3 months ago which helped.    No hx of osteoporosis No HTN per pt.  No latex allergies   PAIN:  Are you having pain? Yes: NPRS scale: 6/10 Pain location: low back and L inner thigh Pain description: achy, stiff Aggravating factors: stair negotiation, standing (for about 15-20 minutes), walking about 30 minutes in the grocery store, but sometimes a few minutes, sitting (for about 30 minutes) Relieving factors: heating pad , using a shopping cart, sitting in recliner with L leg hanging off to the side.   PRECAUTIONS: None  RED FLAGS: Bowel or bladder incontinence: Yes: both, and both Dr. Myer Haff (does not think it is due to her low back) and Dr. Mariah Milling aware and Cauda equina syndrome: Yes: L thigh tingling.     WEIGHT BEARING RESTRICTIONS: No  FALLS:  Has patient fallen in last 6 months? No  LIVING ENVIRONMENT: Lives with: lives with their spouse Lives in: House/apartment Stairs: Yes: External: 2 steps; none (side of the deck) Has following equipment at home: Grab bars  OCCUPATION: retired   PLOF: Independent  PATIENT GOALS: be able to mop the floor, go to sleep without tossing and turning, be able to walk and not hurt  NEXT MD VISIT: Not yet.  OBJECTIVE:  Note: Objective  measures were completed at Evaluation unless otherwise noted.  DIAGNOSTIC FINDINGS:  MR LUMBAR SPINE WO CONTRAST 06/24/2023  Narrative & Impression  CLINICAL DATA:  Cervicalgia.    Chronic bilateral low back pain with left-sided sciatica.   EXAM: MRI CERVICAL AND LUMBAR SPINE WITHOUT CONTRAST   TECHNIQUE: Multiplanar and multiecho pulse sequences of the cervical spine, to include the craniocervical junction and cervicothoracic junction, and lumbar spine, were obtained without intravenous contrast.   COMPARISON:  Radiographs cervical spine February 26, 2019. CT cervical spine November 21, 2011.   No prior study of the lumbar spine.   FINDINGS: MRI CERVICAL SPINE FINDINGS   Alignment: Small anterolisthesis small anterolisthesis at C3-4, C4-5, C5-6 and C7-T1, similar to prior radiograph.   Vertebrae: No fracture, evidence of discitis, or bone lesion.   Cord: Normal signal and morphology.   Posterior Fossa, vertebral arteries, paraspinal tissues: A 3 cm right thyroid lobe nodule.   Disc levels:   C1-2: Left-sided joint effusion.   C2-3: Facet degenerative changes. No significant spinal canal or neural foraminal stenosis.   C3-4: Mild disc bulge without significant spinal canal stenosis. Uncovertebral and facet degenerative change resulting in mild bilateral neural foraminal narrowing.   C4-5: Small posterior disc protrusion without significant spinal canal stenosis. Uncovertebral and facet degenerative changes resulting in moderate bilateral neural foraminal narrowing.   C5-6: Small posterior disc osteophyte complex without significant spinal canal stenosis. Uncovertebral and prominent hypertrophic facet degenerative changes resulting in mild right neural foraminal narrowing.   C6-7: Posterior disc osteophyte complex without significant spinal canal stenosis. Uncovertebral and facet degenerative change resulting in mild right and moderate to severe left neural foraminal narrowing.   C7-T1: Facet degenerative changes resulting in mild-to-moderate left neural foraminal narrowing. No spinal canal stenosis.   MRI LUMBAR SPINE FINDINGS    Segmentation:  Standard segmentation is assumed.   Alignment:  Dextroconvex curvature.   Vertebrae: No acute fracture, evidence of discitis, or bone lesion. Chronic compression fractures of the L3 endplates with associated Schmorl nodes. Endplate degenerative changes throughout the lumbar spine, more pronounced at L4-5.   Conus medullaris and cauda equina: Conus extends to the L1 level. Conus and cauda equina appear normal.   Paraspinal and other soft tissues: Negative.   Disc levels:   T12-L1: No spinal canal or neural foraminal stenosis.   L1-2: Disc bulge with superiorly migrating left central disc extrusion and mild facet degenerative changes. Mild spinal canal stenosis with mild narrowing of the left subarticular zone and mild bilateral neural foraminal narrowing   L2-3: Loss of disc height, left asymmetric disc bulge, moderate facet degenerative changes and ligamentum flavum redundancy resulting in moderate spinal canal stenosis with moderate right and severe left subarticular zone stenosis and moderate bilateral neural foraminal narrowing.   L3-4: Loss of disc height, disc bulge, moderate facet degenerative changes and ligamentum flavum redundancy resulting mild spinal canal stenosis with moderate narrowing of the bilateral subarticular zones, right greater than left, moderate right and mild left neural foraminal narrowing.   L4-5: Loss of disc height, disc bulge, moderate hypertrophic facet degenerative changes and ligamentum flavum redundancy resulting in mild-to-moderate spinal canal stenosis with moderate narrowing of the bilateral subarticular zones and moderate bilateral neural foraminal narrowing.   L5-S1: Shallow disc bulge and moderate facet degenerative changes without significant spinal canal or neural foraminal stenosis.   IMPRESSION: 1. Degenerative changes of the cervical spine without high-grade spinal canal stenosis at any level. 2. Moderate  bilateral neural foraminal narrowing at C4-5. 3.  Mild right and moderate to severe left neural foraminal narrowing at C6-7. 4. Mild-to-moderate left neural foraminal narrowing at C7-T1. 5. Moderate spinal canal stenosis with moderate right and severe left subarticular zone stenosis and moderate bilateral neural foraminal narrowing at L2-3. 6. Mild spinal canal stenosis with moderate narrowing of the bilateral subarticular zones, moderate right and mild left neural foraminal narrowing at L3-4. 7. Mild-to-moderate spinal canal stenosis with moderate narrowing of the bilateral subarticular zones and moderate bilateral neural foraminal narrowing at L4-5. 8. A 3 cm right thyroid lobe nodule. Recommend thyroid ultrasound for further evaluation.     Electronically Signed   By: Baldemar Lenis M.D.   On: 07/11/2023 10:15       PATIENT SURVEYS:  Modified Oswestry 52% (10/02/2023)   COGNITION: Overall cognitive status: Within functional limits for tasks assessed     SENSATION: Not tested    POSTURE: forward neck, B protracted shouldres, R knee slightly higher, R hip in ER, R lumbar convexity, forward flexed, decreased B hip extension, R lateral shift  Decreased low back pain with R lateral shift correction  PALPATION: TTP L greater trochanter L > R   B lumbar paraspinal muscle tenision R > L  TTP low back R > L L5 > L4 > L3 > L2  LUMBAR ROM:   AROM eval  Flexion WFL  Extension Limtied with L low back pain and L L5 dermatome symptoms to L lateral foot.   Right lateral flexion WFL with low back discomfort  Left lateral flexion WFL with low back discomfort  Right rotation full  Left rotation Limited with L L5 pain to foot   (Blank rows = not tested)  LOWER EXTREMITY ROM:     Passive  Right eval Left eval  Hip flexion    Hip extension    Hip abduction    Hip adduction    Hip internal rotation    Hip external rotation    Knee flexion    Knee  extension    Ankle dorsiflexion    Ankle plantarflexion    Ankle inversion    Ankle eversion     (Blank rows = not tested)  LOWER EXTREMITY MMT:    MMT Right eval Left eval  Hip flexion 4 (with low back pain) 4- with L lateral hip pain  Hip extension 3- 3-  Hip abduction (seated manually resisted) 4 4-  Hip adduction    Hip internal rotation    Hip external rotation    Knee flexion 5 4  Knee extension 5 4+ with L L5 dermatome pain  Ankle dorsiflexion    Ankle plantarflexion    Ankle inversion    Ankle eversion     (Blank rows = not tested)  LUMBAR SPECIAL TESTS:  (+) repeated flexion test.   FUNCTIONAL TESTS:    GAIT: Distance walked: 30 ft Assistive device utilized: None Level of assistance: Complete Independence Comments: R lateral lean, decreased stance R LE, antalgic R LE  TREATMENT DATE: 11/30/2023  Therapeutic activites  Seated manually resisted hip extension, abduction 1x each way for each LE  Reviewed progress/current status with PT towards goals   Reviewed POC: continue another 1-2x/week for 6 weeks  Gait x 6 minutes   Antalgic, decreased stance R LE with R lumbar side bend during R LE stance phase  1136 ft, no AD, independent  5/10 low back pain afterwards  Can feel her back pop  Mopping with gentle lunges   R 10x3  L 10x3  Log rolling for sit (on R side on table) <> supine 3x   Log rolling for sit (on L side on table) <> supine 2x     Improved exercise technique, movement at target joints, use of target muscles after mod verbal, visual, tactile cues.     PATIENT EDUCATION:  Education details: There-ex, HEP Person educated: Patient Education method: Explanation, Demonstration, Tactile cues, Verbal cues, and Handouts Education comprehension: verbalized understanding and returned demonstration  HOME EXERCISE  PROGRAM: Access Code: [email or web address] URL: [email or web address] Date: 11/28/2023 Prepared by: Loralyn Freshwater  Exercises - Seated Flexion Stretch  - 3 x daily - 7 x weekly - 1 sets - 10 reps - 10 seconds hold - Standing Sidebends  - 1 x daily - 7 x weekly - 3 sets - 10 reps - 5 seconds hold - Supine March with Posterior Pelvic Tilt  - 1 x daily - 7 x weekly - 3 sets - 10 reps - Hooklying Clamshell with Resistance  - 1 x daily - 7 x weekly - 3 sets - 10 reps - Standing Hip Abduction with Counter Support  - 3 x daily - 7 x weekly - 3 sets - 5 reps - Seated Transversus Abdominis Bracing  - 5 x daily - 7 x weekly - 3 sets - 10 reps - 5 seconds hold  Reclined  Hooklying    Hip extension isometrics, leg straight     R 10x3 with 5 second holds    L 10x3 with 5 second holds     Reviewed and given as part of her HEP. Pt demonstrated and verbalized understanding. Handout provided.       ASSESSMENT:  CLINICAL IMPRESSION:  Pt demonstrates overall improved B hip strength, function, and decreased L LE paresthesia since initial evaluation. Pt however still reports of elevated low back pain levels when waking up first thing in the morning as well as with ambulation and performing chores such as mopping which make performing the aforementioned activities and chores difficult for her. Worked on ergonomic mopping as well as log rolling to improve comfort with chores and bed mobility at home. No back pain reported after session. Pt will benefit from continued skilled physical therapy services to address the aforementioned deficits and to continue to decrease pain, improve strength and function.           OBJECTIVE IMPAIRMENTS: difficulty walking, decreased strength, improper body mechanics, postural dysfunction, and pain.   ACTIVITY LIMITATIONS: carrying, lifting, bending, sitting, standing, stairs, and locomotion level  PARTICIPATION LIMITATIONS:   PERSONAL FACTORS: Age, Fitness,  Past/current experiences, Time since onset of injury/illness/exacerbation, and 3+ comorbidities: anxiety, arthritis, depression, hx of parotid gland tumor, seizures  are also affecting patient's functional outcome.   REHAB POTENTIAL: Fair    CLINICAL DECISION MAKING: Stable/uncomplicated  EVALUATION COMPLEXITY: Low   GOALS: Goals reviewed with patient? Yes  SHORT TERM GOALS: Target date: 10/13/2023  Pt will be independent with her initial HEP to decrease pain, improve strength, function, and  ability to perform tasks that involve standing, and walking more comfortably for her back.  Baseline:Pt has not yet started her initial HEP (10/02/2023); No questions, able to perform (11/08/2023) Goal status: MET   LONG TERM GOALS: Target date: 01/12/2024  Pt will have a decrease in low back pain to 5/10 or less at worst to promote ability to perform standing and walking tasks as well as negotiate stairs more comfortably.  Baseline: low back pain 10/10 at most for the past 3 months (10/02/2023); 8/10 at most (in the mornings) for the past 7 days. Gets better after taking the celebrex which she has been taking for the past 2 years (11/06/2023); 9/10 in the mornings, but feels better after movement. Other than first thing in the morning, 4-5/10 at worst (11/20/2023); 8/10 at worst (in the mornings) for the past 7 days  (11/30/2023) Goal status: Ongoing  2.  Pt will have a decrease in L medial thigh pain to 3/10 or less at worst to promote ability to perform standing tasks as well as get into and out of vehicles more comfortably.   Baseline: L inner thigh pain 8/10 at worst for the past 3 months; 0/10 L inner thigh pain for the past 7 days (11/08/2023) Goal status: MET  3.  Pt will improve her B hip extension and abduction strength by at least 1/2 MMT grade to promote ability to perform standing tasks as well ambulate more comfortably for her back.  Baseline:  MMT Right eval Left eval R (11/20/2023)  L (11/20/2023) R (11/30/2023) L (11/30/2023)  Hip extension (seated manually resitsed) 3- 3- 3 3 4+ 4+  Hip abduction (seated manually resisted) 4 4- 4 4 4  4+   (10/02/2023)   Goal status: Partially met  4.  Pt will improve her Modified Oswestry Low Back Pain disability questionnaire score by at least 10% as a demonstration of improved function.  Baseline: Modified Oswestry Low Back Pain disability questionnaire 52% (10/02/2023); 28% (11/13/2023) Goal status: Met  5.  Pt will improve her 6 minute walk test distance to 1250 ft or more with 3/10 or less back pain to promote ability to ambulate longer distances more comfortably.  Baseline: 1136 ft with 5/10 low back pain afterwards (11/30/2023) Goal status: INITIAL     PLAN:  PT FREQUENCY: 1-2x/week  PT DURATION: 6 weeks  PLANNED INTERVENTIONS: 97110-Therapeutic exercises, 97530- Therapeutic activity, 97112- Neuromuscular re-education, 97535- Self Care, 16109- Manual therapy, [id number]- Aquatic Therapy, 97014- Electrical stimulation (unattended), [id number]- Traction (mechanical), [id number]- Ionotophoresis 4mg /ml Dexamethasone, Patient/Family education, Dry Needling, Joint mobilization, and Spinal mobilization.  PLAN FOR NEXT SESSION: posture, scapular, trunk, glute strengthening, lumbopelvic control, manual techniques, modalities PRN   Rebacca Votaw, PT, DPT 11/30/2023, 12:21 PM

## 2023-12-04 ENCOUNTER — Ambulatory Visit: Payer: Medicare Other

## 2023-12-04 DIAGNOSIS — M5416 Radiculopathy, lumbar region: Secondary | ICD-10-CM

## 2023-12-04 DIAGNOSIS — M5459 Other low back pain: Secondary | ICD-10-CM

## 2023-12-04 NOTE — Therapy (Signed)
 OUTPATIENT PHYSICAL THERAPY TREATMENT    Patient Name: April Nguyen MRN: [id number] DOB:24-Aug-1951, 73 y.o., female Today's Date: 12/04/2023  END OF SESSION:  PT End of Session - 12/04/23 1124     Visit Number 14    Number of Visits 25    Date for PT Re-Evaluation 01/12/24    PT Start Time 1125   pt arrived late   PT Stop Time 1203    PT Time Calculation (min) 38 min    Activity Tolerance Patient tolerated treatment well    Behavior During Therapy [facility] for tasks assessed/performed                          Past Medical History:  Diagnosis Date   Allergy    Seasonal   Anxiety    Arthritis    neck, wrists,hands, toes, and feet   COVID-19 09/08/2021   Depression    Eustachian tube dysfunction    Family history of adverse reaction to anesthesia    brother - PONV and combative   Genital warts    In past   GERD (gastroesophageal reflux disease)    History of shingles 04/16/2015   March 2016    history of Tumor of parotid gland    Hyperlipidemia    Meniere's disease    slight hearing loss, car sickness and dizziness   Migraines    sinus/stress   Monoclonal gammopathy    Polyarthralgia    PONV (postoperative nausea and vomiting)    and headaches and combative   Pre-diabetes    RLS (restless legs syndrome)    Seizures (HCC)    Dx 16 yrs ago - hormonal - none at least 3 yrs   Seizures (HCC)    H/O due to hormone issues   Past Surgical History:  Procedure Laterality Date   ABDOMINAL HYSTERECTOMY     CATARACT EXTRACTION Bilateral 01/2023   CHOLECYSTECTOMY  09/05/2006   dialation and cartarage  1982, 1999, 2000   DISTAL INTERPHALANGEAL JOINT FUSION Left 05/24/2022   Procedure: Left index and ring DIP fusion;  Surgeon: Kennedy Bucker, MD;  Location: [facility];  Service: Orthopedics;  Laterality: Left;   FINGER SURGERY  2011, 2012   Bone fusion of middle finger right and left hands   FINGER SURGERY  09/06/2011   Pin removed from left  middle finger   GANGLION CYST EXCISION  2011, 2012   Left and right hands   MYRINGOTOMY WITH TUBE PLACEMENT Bilateral 05/13/2015   Procedure: MYRINGOTOMY WITH  BUTTERFLY TUBE PLACEMENT;  Surgeon: Bud Face, MD;  Location: [facility];  Service: ENT;  Laterality: Bilateral;   MYRINGOTOMY WITH TUBE PLACEMENT Bilateral 08/28/2019   Procedure: MYRINGOTOMY WITH BUTTERFLY TUBE PLACEMENT;  Surgeon: Bud Face, MD;  Location: [facility];  Service: ENT;  Laterality: Bilateral;   PAROTID GLAND TUMOR EXCISION  09/05/1984   TONSILLECTOMY AND ADENOIDECTOMY  09/05/1957   TUBAL LIGATION  09/05/2008   Patient Active Problem List   Diagnosis Date Noted   Influenza A 10/16/2023   Chronic fatigue 02/27/2023   Hot flashes 02/27/2023   Monoclonal gammopathy 12/29/2021   Polyarthralgia 11/01/2021   Left foot pain 11/01/2021   Restless legs 03/09/2021   Prediabetes 02/26/2019   Chronic neck pain 02/26/2019   Hormone replacement therapy (HRT) 02/22/2018   Meniere disease 02/22/2018   Preventative health care 02/11/2016   Hyperlipidemia 02/11/2016   Insomnia 01/22/2016   Depression with anxiety 04/16/2015  GERD (gastroesophageal reflux disease) 04/16/2015   Arthritis 04/16/2015   ETD (eustachian tube dysfunction) 04/16/2015    PCP: Doreene Nest, NP   REFERRING PROVIDER: Venetia Night, MD   REFERRING DIAG: [phone] (ICD-10-CM) - Neurogenic claudication due to lumbar spinal stenosis  Rationale for Evaluation and Treatment: Rehabilitation  THERAPY DIAG:  Other low back pain  Radiculopathy, lumbar region  ONSET DATE: a year ago  SUBJECTIVE:                                                                                                                                                                                           SUBJECTIVE STATEMENT: Back is ok. 4-5/10 currently. Back was ok after last session. Mornings are slightly better. Walked, went  shopping from 2:30 pm to 6 pm, walked very slow Saturday. Pt was stiff the next day.  Back feels stiff currently. R shoulder is bothering her today.        PERTINENT HISTORY:  Low back pain with neurogenic claudication. Has L LE symptoms. She also has L UE pain. Low back pain began about a year ago. Had an MRI for neck and low back. Was referred to Dr. Myer Haff who said that she had a pinched nerve in low back. Had 2 steroid injections for low back, the second injection (December 2024) helped. Has L R and L hip (greater trochanter area bilaterally). Had a steroid injection for L hip 3 months ago which helped.    No hx of osteoporosis No HTN per pt.  No latex allergies   PAIN:  Are you having pain? Yes: NPRS scale: 6/10 Pain location: low back and L inner thigh Pain description: achy, stiff Aggravating factors: stair negotiation, standing (for about 15-20 minutes), walking about 30 minutes in the grocery store, but sometimes a few minutes, sitting (for about 30 minutes) Relieving factors: heating pad , using a shopping cart, sitting in recliner with L leg hanging off to the side.   PRECAUTIONS: None  RED FLAGS: Bowel or bladder incontinence: Yes: both, and both Dr. Myer Haff (does not think it is due to her low back) and Dr. Mariah Milling aware and Cauda equina syndrome: Yes: L thigh tingling.     WEIGHT BEARING RESTRICTIONS: No  FALLS:  Has patient fallen in last 6 months? No  LIVING ENVIRONMENT: Lives with: lives with their spouse Lives in: House/apartment Stairs: Yes: External: 2 steps; none (side of the deck) Has following equipment at home: Grab bars  OCCUPATION: retired   PLOF: Independent  PATIENT GOALS: be able to mop the floor, go to sleep without tossing and turning, be able to walk and  not hurt  NEXT MD VISIT: Not yet.  OBJECTIVE:  Note: Objective measures were completed at Evaluation unless otherwise noted.  DIAGNOSTIC FINDINGS:  MR LUMBAR SPINE WO CONTRAST  06/24/2023  Narrative & Impression  CLINICAL DATA:  Cervicalgia.   Chronic bilateral low back pain with left-sided sciatica.   EXAM: MRI CERVICAL AND LUMBAR SPINE WITHOUT CONTRAST   TECHNIQUE: Multiplanar and multiecho pulse sequences of the cervical spine, to include the craniocervical junction and cervicothoracic junction, and lumbar spine, were obtained without intravenous contrast.   COMPARISON:  Radiographs cervical spine February 26, 2019. CT cervical spine November 21, 2011.   No prior study of the lumbar spine.   FINDINGS: MRI CERVICAL SPINE FINDINGS   Alignment: Small anterolisthesis small anterolisthesis at C3-4, C4-5, C5-6 and C7-T1, similar to prior radiograph.   Vertebrae: No fracture, evidence of discitis, or bone lesion.   Cord: Normal signal and morphology.   Posterior Fossa, vertebral arteries, paraspinal tissues: A 3 cm right thyroid lobe nodule.   Disc levels:   C1-2: Left-sided joint effusion.   C2-3: Facet degenerative changes. No significant spinal canal or neural foraminal stenosis.   C3-4: Mild disc bulge without significant spinal canal stenosis. Uncovertebral and facet degenerative change resulting in mild bilateral neural foraminal narrowing.   C4-5: Small posterior disc protrusion without significant spinal canal stenosis. Uncovertebral and facet degenerative changes resulting in moderate bilateral neural foraminal narrowing.   C5-6: Small posterior disc osteophyte complex without significant spinal canal stenosis. Uncovertebral and prominent hypertrophic facet degenerative changes resulting in mild right neural foraminal narrowing.   C6-7: Posterior disc osteophyte complex without significant spinal canal stenosis. Uncovertebral and facet degenerative change resulting in mild right and moderate to severe left neural foraminal narrowing.   C7-T1: Facet degenerative changes resulting in mild-to-moderate left neural foraminal narrowing. No  spinal canal stenosis.   MRI LUMBAR SPINE FINDINGS   Segmentation:  Standard segmentation is assumed.   Alignment:  Dextroconvex curvature.   Vertebrae: No acute fracture, evidence of discitis, or bone lesion. Chronic compression fractures of the L3 endplates with associated Schmorl nodes. Endplate degenerative changes throughout the lumbar spine, more pronounced at L4-5.   Conus medullaris and cauda equina: Conus extends to the L1 level. Conus and cauda equina appear normal.   Paraspinal and other soft tissues: Negative.   Disc levels:   T12-L1: No spinal canal or neural foraminal stenosis.   L1-2: Disc bulge with superiorly migrating left central disc extrusion and mild facet degenerative changes. Mild spinal canal stenosis with mild narrowing of the left subarticular zone and mild bilateral neural foraminal narrowing   L2-3: Loss of disc height, left asymmetric disc bulge, moderate facet degenerative changes and ligamentum flavum redundancy resulting in moderate spinal canal stenosis with moderate right and severe left subarticular zone stenosis and moderate bilateral neural foraminal narrowing.   L3-4: Loss of disc height, disc bulge, moderate facet degenerative changes and ligamentum flavum redundancy resulting mild spinal canal stenosis with moderate narrowing of the bilateral subarticular zones, right greater than left, moderate right and mild left neural foraminal narrowing.   L4-5: Loss of disc height, disc bulge, moderate hypertrophic facet degenerative changes and ligamentum flavum redundancy resulting in mild-to-moderate spinal canal stenosis with moderate narrowing of the bilateral subarticular zones and moderate bilateral neural foraminal narrowing.   L5-S1: Shallow disc bulge and moderate facet degenerative changes without significant spinal canal or neural foraminal stenosis.   IMPRESSION: 1. Degenerative changes of the cervical spine without  high-grade spinal canal  stenosis at any level. 2. Moderate bilateral neural foraminal narrowing at C4-5. 3. Mild right and moderate to severe left neural foraminal narrowing at C6-7. 4. Mild-to-moderate left neural foraminal narrowing at C7-T1. 5. Moderate spinal canal stenosis with moderate right and severe left subarticular zone stenosis and moderate bilateral neural foraminal narrowing at L2-3. 6. Mild spinal canal stenosis with moderate narrowing of the bilateral subarticular zones, moderate right and mild left neural foraminal narrowing at L3-4. 7. Mild-to-moderate spinal canal stenosis with moderate narrowing of the bilateral subarticular zones and moderate bilateral neural foraminal narrowing at L4-5. 8. A 3 cm right thyroid lobe nodule. Recommend thyroid ultrasound for further evaluation.     Electronically Signed   By: Baldemar Lenis M.D.   On: 07/11/2023 10:15       PATIENT SURVEYS:  Modified Oswestry 52% (10/02/2023)   COGNITION: Overall cognitive status: Within functional limits for tasks assessed     SENSATION: Not tested    POSTURE: forward neck, B protracted shouldres, R knee slightly higher, R hip in ER, R lumbar convexity, forward flexed, decreased B hip extension, R lateral shift  Decreased low back pain with R lateral shift correction  PALPATION: TTP L greater trochanter L > R   B lumbar paraspinal muscle tenision R > L  TTP low back R > L L5 > L4 > L3 > L2  LUMBAR ROM:   AROM eval  Flexion WFL  Extension Limtied with L low back pain and L L5 dermatome symptoms to L lateral foot.   Right lateral flexion WFL with low back discomfort  Left lateral flexion WFL with low back discomfort  Right rotation full  Left rotation Limited with L L5 pain to foot   (Blank rows = not tested)  LOWER EXTREMITY ROM:     Passive  Right eval Left eval  Hip flexion    Hip extension    Hip abduction    Hip adduction    Hip internal rotation     Hip external rotation    Knee flexion    Knee extension    Ankle dorsiflexion    Ankle plantarflexion    Ankle inversion    Ankle eversion     (Blank rows = not tested)  LOWER EXTREMITY MMT:    MMT Right eval Left eval  Hip flexion 4 (with low back pain) 4- with L lateral hip pain  Hip extension 3- 3-  Hip abduction (seated manually resisted) 4 4-  Hip adduction    Hip internal rotation    Hip external rotation    Knee flexion 5 4  Knee extension 5 4+ with L L5 dermatome pain  Ankle dorsiflexion    Ankle plantarflexion    Ankle inversion    Ankle eversion     (Blank rows = not tested)  LUMBAR SPECIAL TESTS:  (+) repeated flexion test.   FUNCTIONAL TESTS:    GAIT: Distance walked: 30 ft Assistive device utilized: None Level of assistance: Complete Independence Comments: R lateral lean, decreased stance R LE, antalgic R LE  TREATMENT DATE: 12/04/2023  Therapeutic activites   Log rolling for sit (on R side on table) <> supine 1x  Supine lower trunk rotation 10x3 each side to decrease lumbar stiffness.   Decreased low back stiffness afterwards reported.    Log rolling for sit (on R side on table) <> supine 5x  Mopping with gentle lunges, using rolling measurement tool to simulate the mop  R 10x3  L 10x3   Standing B shoulder extension yellow band to promote trunk strengthening 10x5 seconds for 3 sets  Standing with B UE assist   Hip abduction    R 10x2   L 10x2      Challenging per pt  Standing static mini squat 10x     Improved exercise technique, movement at target joints, use of target muscles after mod verbal, visual, tactile cues.     PATIENT EDUCATION:  Education details: There-ex, HEP Person educated: Patient Education method: Explanation, Demonstration, Tactile cues, Verbal cues, and Handouts Education  comprehension: verbalized understanding and returned demonstration  HOME EXERCISE PROGRAM: Access Code: [email or web address] URL: [email or web address] Date: 11/28/2023 Prepared by: Loralyn Freshwater  Exercises - Seated Flexion Stretch  - 3 x daily - 7 x weekly - 1 sets - 10 reps - 10 seconds hold - Standing Sidebends  - 1 x daily - 7 x weekly - 3 sets - 10 reps - 5 seconds hold - Supine March with Posterior Pelvic Tilt  - 1 x daily - 7 x weekly - 3 sets - 10 reps - Hooklying Clamshell with Resistance  - 1 x daily - 7 x weekly - 3 sets - 10 reps - Standing Hip Abduction with Counter Support  - 3 x daily - 7 x weekly - 3 sets - 5 reps - Seated Transversus Abdominis Bracing  - 5 x daily - 7 x weekly - 3 sets - 10 reps - 5 seconds hold  Reclined  Hooklying    Hip extension isometrics, leg straight     R 10x3 with 5 second holds    L 10x3 with 5 second holds     Reviewed and given as part of her HEP. Pt demonstrated and verbalized understanding. Handout provided.    - Sitting to Supine Roll  - 3 x daily - 7 x weekly - 1 sets - 5 reps - Supine Lower Trunk Rotation  - 1 x daily - 7 x weekly - 3 sets - 10 reps      ASSESSMENT:  CLINICAL IMPRESSION:  Worked on lower trunk rotation to decrease lumbar stiffness, followed by log rolling to help decrease stress to low back. Continued working on ergonomic mopping to improve comfort with chores. Also worked on trunk and glute strength to help decrease excessive stress to low back during standing tasks. Back feels better after session reported. Pt will benefit from continued skilled physical therapy services to address the aforementioned deficits and to continue to decrease pain, improve strength and function.           OBJECTIVE IMPAIRMENTS: difficulty walking, decreased strength, improper body mechanics, postural dysfunction, and pain.   ACTIVITY LIMITATIONS: carrying, lifting, bending, sitting, standing, stairs, and locomotion  level  PARTICIPATION LIMITATIONS:   PERSONAL FACTORS: Age, Fitness, Past/current experiences, Time since onset of injury/illness/exacerbation, and 3+ comorbidities: anxiety, arthritis, depression, hx of parotid gland tumor, seizures  are also affecting patient's functional outcome.   REHAB POTENTIAL: Fair    CLINICAL DECISION MAKING: Stable/uncomplicated  EVALUATION COMPLEXITY: Low   GOALS: Goals reviewed with patient? Yes  SHORT TERM GOALS: Target date: 10/13/2023  Pt will be independent with her initial HEP to decrease pain, improve strength, function, and ability to perform tasks that involve standing, and walking more comfortably for her back.  Baseline:Pt has not yet started her initial HEP (10/02/2023); No questions, able to perform (11/08/2023) Goal status: MET   LONG TERM GOALS: Target date: 01/12/2024  Pt will have a decrease in low back pain to 5/10 or less at worst to promote ability to perform standing and walking tasks as well as negotiate stairs more comfortably.  Baseline: low back pain 10/10 at most for the past 3 months (10/02/2023); 8/10 at most (in the mornings) for the past 7 days. Gets better after taking the celebrex which she has been taking for the past 2 years (11/06/2023); 9/10 in the mornings, but feels better after movement. Other than first thing in the morning, 4-5/10 at worst (11/20/2023); 8/10 at worst (in the mornings) for the past 7 days  (11/30/2023) Goal status: Ongoing  2.  Pt will have a decrease in L medial thigh pain to 3/10 or less at worst to promote ability to perform standing tasks as well as get into and out of vehicles more comfortably.   Baseline: L inner thigh pain 8/10 at worst for the past 3 months; 0/10 L inner thigh pain for the past 7 days (11/08/2023) Goal status: MET  3.  Pt will improve her B hip extension and abduction strength by at least 1/2 MMT grade to promote ability to perform standing tasks as well ambulate more comfortably for her  back.  Baseline:  MMT Right eval Left eval R (11/20/2023) L (11/20/2023) R (11/30/2023) L (11/30/2023)  Hip extension (seated manually resitsed) 3- 3- 3 3 4+ 4+  Hip abduction (seated manually resisted) 4 4- 4 4 4  4+   (10/02/2023)   Goal status: Partially met  4.  Pt will improve her Modified Oswestry Low Back Pain disability questionnaire score by at least 10% as a demonstration of improved function.  Baseline: Modified Oswestry Low Back Pain disability questionnaire 52% (10/02/2023); 28% (11/13/2023) Goal status: Met  5.  Pt will improve her 6 minute walk test distance to 1250 ft or more with 3/10 or less back pain to promote ability to ambulate longer distances more comfortably.  Baseline: 1136 ft with 5/10 low back pain afterwards (11/30/2023) Goal status: INITIAL     PLAN:  PT FREQUENCY: 1-2x/week  PT DURATION: 6 weeks  PLANNED INTERVENTIONS: 97110-Therapeutic exercises, 97530- Therapeutic activity, 97112- Neuromuscular re-education, 97535- Self Care, 40981- Manual therapy, [id number]- Aquatic Therapy, 97014- Electrical stimulation (unattended), [id number]- Traction (mechanical), [id number]- Ionotophoresis 4mg /ml Dexamethasone, Patient/Family education, Dry Needling, Joint mobilization, and Spinal mobilization.  PLAN FOR NEXT SESSION: posture, scapular, trunk, glute strengthening, lumbopelvic control, manual techniques, modalities PRN   Abegail Kloeppel, PT, DPT 12/04/2023, 12:49 PM

## 2023-12-05 ENCOUNTER — Other Ambulatory Visit: Payer: Self-pay

## 2023-12-06 ENCOUNTER — Ambulatory Visit: Payer: Medicare Other | Attending: Neurosurgery

## 2023-12-06 ENCOUNTER — Other Ambulatory Visit: Payer: Self-pay

## 2023-12-06 DIAGNOSIS — M5416 Radiculopathy, lumbar region: Secondary | ICD-10-CM | POA: Insufficient documentation

## 2023-12-06 DIAGNOSIS — M5459 Other low back pain: Secondary | ICD-10-CM | POA: Insufficient documentation

## 2023-12-06 NOTE — Therapy (Signed)
 OUTPATIENT PHYSICAL THERAPY TREATMENT    Patient Name: April Nguyen MRN: [id number] DOB:November 05, 1950, 73 y.o., female Today's Date: 12/06/2023  END OF SESSION:  PT End of Session - 12/06/23 1120     Visit Number 15    Number of Visits 25    Date for PT Re-Evaluation 01/12/24    PT Start Time 1120    PT Stop Time 1158    PT Time Calculation (min) 38 min    Activity Tolerance Patient tolerated treatment well    Behavior During Therapy WFL for tasks assessed/performed                           Past Medical History:  Diagnosis Date   Allergy    Seasonal   Anxiety    Arthritis    neck, wrists,hands, toes, and feet   COVID-19 09/08/2021   Depression    Eustachian tube dysfunction    Family history of adverse reaction to anesthesia    brother - PONV and combative   Genital warts    In past   GERD (gastroesophageal reflux disease)    History of shingles 04/16/2015   March 2016    history of Tumor of parotid gland    Hyperlipidemia    Meniere's disease    slight hearing loss, car sickness and dizziness   Migraines    sinus/stress   Monoclonal gammopathy    Polyarthralgia    PONV (postoperative nausea and vomiting)    and headaches and combative   Pre-diabetes    RLS (restless legs syndrome)    Seizures (HCC)    Dx 16 yrs ago - hormonal - none at least 3 yrs   Seizures (HCC)    H/O due to hormone issues   Past Surgical History:  Procedure Laterality Date   ABDOMINAL HYSTERECTOMY     CATARACT EXTRACTION Bilateral 01/2023   CHOLECYSTECTOMY  09/05/2006   dialation and cartarage  1982, 1999, 2000   DISTAL INTERPHALANGEAL JOINT FUSION Left 05/24/2022   Procedure: Left index and ring DIP fusion;  Surgeon: Kennedy Bucker, MD;  Location: [facility];  Service: Orthopedics;  Laterality: Left;   FINGER SURGERY  2011, 2012   Bone fusion of middle finger right and left hands   FINGER SURGERY  09/06/2011   Pin removed from left middle finger    GANGLION CYST EXCISION  2011, 2012   Left and right hands   MYRINGOTOMY WITH TUBE PLACEMENT Bilateral 05/13/2015   Procedure: MYRINGOTOMY WITH  BUTTERFLY TUBE PLACEMENT;  Surgeon: Bud Face, MD;  Location: [facility];  Service: ENT;  Laterality: Bilateral;   MYRINGOTOMY WITH TUBE PLACEMENT Bilateral 08/28/2019   Procedure: MYRINGOTOMY WITH BUTTERFLY TUBE PLACEMENT;  Surgeon: Bud Face, MD;  Location: [facility];  Service: ENT;  Laterality: Bilateral;   PAROTID GLAND TUMOR EXCISION  09/05/1984   TONSILLECTOMY AND ADENOIDECTOMY  09/05/1957   TUBAL LIGATION  09/05/2008   Patient Active Problem List   Diagnosis Date Noted   Influenza A 10/16/2023   Chronic fatigue 02/27/2023   Hot flashes 02/27/2023   Monoclonal gammopathy 12/29/2021   Polyarthralgia 11/01/2021   Left foot pain 11/01/2021   Restless legs 03/09/2021   Prediabetes 02/26/2019   Chronic neck pain 02/26/2019   Hormone replacement therapy (HRT) 02/22/2018   Meniere disease 02/22/2018   Preventative health care 02/11/2016   Hyperlipidemia 02/11/2016   Insomnia 01/22/2016   Depression with anxiety 04/16/2015   GERD (  gastroesophageal reflux disease) 04/16/2015   Arthritis 04/16/2015   ETD (eustachian tube dysfunction) 04/16/2015    PCP: Doreene Nest, NP   REFERRING PROVIDER: Venetia Night, MD   REFERRING DIAG: [phone] (ICD-10-CM) - Neurogenic claudication due to lumbar spinal stenosis  Rationale for Evaluation and Treatment: Rehabilitation  THERAPY DIAG:  Other low back pain  Radiculopathy, lumbar region  ONSET DATE: a year ago  SUBJECTIVE:                                                                                                                                                                                           SUBJECTIVE STATEMENT: Back is ok, about a 3/10 currently. Has been ok since last session. Mopping is better. Back usually feels good after session.     PERTINENT HISTORY:  Low back pain with neurogenic claudication. Has L LE symptoms. She also has L UE pain. Low back pain began about a year ago. Had an MRI for neck and low back. Was referred to Dr. Myer Haff who said that she had a pinched nerve in low back. Had 2 steroid injections for low back, the second injection (December 2024) helped. Has L R and L hip (greater trochanter area bilaterally). Had a steroid injection for L hip 3 months ago which helped.    No hx of osteoporosis No HTN per pt.  No latex allergies   PAIN:  Are you having pain? Yes: NPRS scale: 6/10 Pain location: low back and L inner thigh Pain description: achy, stiff Aggravating factors: stair negotiation, standing (for about 15-20 minutes), walking about 30 minutes in the grocery store, but sometimes a few minutes, sitting (for about 30 minutes) Relieving factors: heating pad , using a shopping cart, sitting in recliner with L leg hanging off to the side.   PRECAUTIONS: None  RED FLAGS: Bowel or bladder incontinence: Yes: both, and both Dr. Myer Haff (does not think it is due to her low back) and Dr. Mariah Milling aware and Cauda equina syndrome: Yes: L thigh tingling.     WEIGHT BEARING RESTRICTIONS: No  FALLS:  Has patient fallen in last 6 months? No  LIVING ENVIRONMENT: Lives with: lives with their spouse Lives in: House/apartment Stairs: Yes: External: 2 steps; none (side of the deck) Has following equipment at home: Grab bars  OCCUPATION: retired   PLOF: Independent  PATIENT GOALS: be able to mop the floor, go to sleep without tossing and turning, be able to walk and not hurt  NEXT MD VISIT: Not yet.  OBJECTIVE:  Note: Objective measures were completed at Evaluation unless otherwise noted.  DIAGNOSTIC FINDINGS:  MR LUMBAR SPINE  WO CONTRAST 06/24/2023  Narrative & Impression  CLINICAL DATA:  Cervicalgia.   Chronic bilateral low back pain with left-sided sciatica.   EXAM: MRI CERVICAL AND  LUMBAR SPINE WITHOUT CONTRAST   TECHNIQUE: Multiplanar and multiecho pulse sequences of the cervical spine, to include the craniocervical junction and cervicothoracic junction, and lumbar spine, were obtained without intravenous contrast.   COMPARISON:  Radiographs cervical spine February 26, 2019. CT cervical spine November 21, 2011.   No prior study of the lumbar spine.   FINDINGS: MRI CERVICAL SPINE FINDINGS   Alignment: Small anterolisthesis small anterolisthesis at C3-4, C4-5, C5-6 and C7-T1, similar to prior radiograph.   Vertebrae: No fracture, evidence of discitis, or bone lesion.   Cord: Normal signal and morphology.   Posterior Fossa, vertebral arteries, paraspinal tissues: A 3 cm right thyroid lobe nodule.   Disc levels:   C1-2: Left-sided joint effusion.   C2-3: Facet degenerative changes. No significant spinal canal or neural foraminal stenosis.   C3-4: Mild disc bulge without significant spinal canal stenosis. Uncovertebral and facet degenerative change resulting in mild bilateral neural foraminal narrowing.   C4-5: Small posterior disc protrusion without significant spinal canal stenosis. Uncovertebral and facet degenerative changes resulting in moderate bilateral neural foraminal narrowing.   C5-6: Small posterior disc osteophyte complex without significant spinal canal stenosis. Uncovertebral and prominent hypertrophic facet degenerative changes resulting in mild right neural foraminal narrowing.   C6-7: Posterior disc osteophyte complex without significant spinal canal stenosis. Uncovertebral and facet degenerative change resulting in mild right and moderate to severe left neural foraminal narrowing.   C7-T1: Facet degenerative changes resulting in mild-to-moderate left neural foraminal narrowing. No spinal canal stenosis.   MRI LUMBAR SPINE FINDINGS   Segmentation:  Standard segmentation is assumed.   Alignment:  Dextroconvex curvature.    Vertebrae: No acute fracture, evidence of discitis, or bone lesion. Chronic compression fractures of the L3 endplates with associated Schmorl nodes. Endplate degenerative changes throughout the lumbar spine, more pronounced at L4-5.   Conus medullaris and cauda equina: Conus extends to the L1 level. Conus and cauda equina appear normal.   Paraspinal and other soft tissues: Negative.   Disc levels:   T12-L1: No spinal canal or neural foraminal stenosis.   L1-2: Disc bulge with superiorly migrating left central disc extrusion and mild facet degenerative changes. Mild spinal canal stenosis with mild narrowing of the left subarticular zone and mild bilateral neural foraminal narrowing   L2-3: Loss of disc height, left asymmetric disc bulge, moderate facet degenerative changes and ligamentum flavum redundancy resulting in moderate spinal canal stenosis with moderate right and severe left subarticular zone stenosis and moderate bilateral neural foraminal narrowing.   L3-4: Loss of disc height, disc bulge, moderate facet degenerative changes and ligamentum flavum redundancy resulting mild spinal canal stenosis with moderate narrowing of the bilateral subarticular zones, right greater than left, moderate right and mild left neural foraminal narrowing.   L4-5: Loss of disc height, disc bulge, moderate hypertrophic facet degenerative changes and ligamentum flavum redundancy resulting in mild-to-moderate spinal canal stenosis with moderate narrowing of the bilateral subarticular zones and moderate bilateral neural foraminal narrowing.   L5-S1: Shallow disc bulge and moderate facet degenerative changes without significant spinal canal or neural foraminal stenosis.   IMPRESSION: 1. Degenerative changes of the cervical spine without high-grade spinal canal stenosis at any level. 2. Moderate bilateral neural foraminal narrowing at C4-5. 3. Mild right and moderate to severe left neural  foraminal narrowing at C6-7. 4. Mild-to-moderate left  neural foraminal narrowing at C7-T1. 5. Moderate spinal canal stenosis with moderate right and severe left subarticular zone stenosis and moderate bilateral neural foraminal narrowing at L2-3. 6. Mild spinal canal stenosis with moderate narrowing of the bilateral subarticular zones, moderate right and mild left neural foraminal narrowing at L3-4. 7. Mild-to-moderate spinal canal stenosis with moderate narrowing of the bilateral subarticular zones and moderate bilateral neural foraminal narrowing at L4-5. 8. A 3 cm right thyroid lobe nodule. Recommend thyroid ultrasound for further evaluation.     Electronically Signed   By: Baldemar Lenis M.D.   On: 07/11/2023 10:15       PATIENT SURVEYS:  Modified Oswestry 52% (10/02/2023)   COGNITION: Overall cognitive status: Within functional limits for tasks assessed     SENSATION: Not tested    POSTURE: forward neck, B protracted shouldres, R knee slightly higher, R hip in ER, R lumbar convexity, forward flexed, decreased B hip extension, R lateral shift  Decreased low back pain with R lateral shift correction  PALPATION: TTP L greater trochanter L > R   B lumbar paraspinal muscle tenision R > L  TTP low back R > L L5 > L4 > L3 > L2  LUMBAR ROM:   AROM eval  Flexion WFL  Extension Limtied with L low back pain and L L5 dermatome symptoms to L lateral foot.   Right lateral flexion WFL with low back discomfort  Left lateral flexion WFL with low back discomfort  Right rotation full  Left rotation Limited with L L5 pain to foot   (Blank rows = not tested)  LOWER EXTREMITY ROM:     Passive  Right eval Left eval  Hip flexion    Hip extension    Hip abduction    Hip adduction    Hip internal rotation    Hip external rotation    Knee flexion    Knee extension    Ankle dorsiflexion    Ankle plantarflexion    Ankle inversion    Ankle eversion      (Blank rows = not tested)  LOWER EXTREMITY MMT:    MMT Right eval Left eval  Hip flexion 4 (with low back pain) 4- with L lateral hip pain  Hip extension 3- 3-  Hip abduction (seated manually resisted) 4 4-  Hip adduction    Hip internal rotation    Hip external rotation    Knee flexion 5 4  Knee extension 5 4+ with L L5 dermatome pain  Ankle dorsiflexion    Ankle plantarflexion    Ankle inversion    Ankle eversion     (Blank rows = not tested)  LUMBAR SPECIAL TESTS:  (+) repeated flexion test.   FUNCTIONAL TESTS:    GAIT: Distance walked: 30 ft Assistive device utilized: None Level of assistance: Complete Independence Comments: R lateral lean, decreased stance R LE, antalgic R LE  TREATMENT DATE: 12/06/2023  Therapeutic activities  Supine lower trunk rotation 10x3 each side to decrease lumbar stiffness.    Log rolling for sit (on R side on table) <> supine 5x  Recommended using a soft mattress topper to decrease pressure on her back when sleeping secondary to pt stating that she sleeps on a firm mattress.   Log rolling for sit (on L side on table) <> supine 5x  Standing with B UE assist   Hip abduction    R 10x3   L 10x3      Challenging per pt  Standing static mini lunge with contralateral UE assist  R 10x3  L 10x3   Cues for proper form and femoral control   Good glute muscle use felt reported   Standing B shoulder extension yellow band to promote trunk strengthening 10x5 seconds for 3 sets       Improved exercise technique, movement at target joints, use of target muscles after mod verbal, visual, tactile cues.     PATIENT EDUCATION:  Education details: There-ex, HEP Person educated: Patient Education method: Explanation, Demonstration, Tactile cues, Verbal cues, and Handouts Education comprehension: verbalized  understanding and returned demonstration  HOME EXERCISE PROGRAM: Access Code: [email or web address] URL: [email or web address] Date: 11/28/2023 Prepared by: Loralyn Freshwater  Exercises - Seated Flexion Stretch  - 3 x daily - 7 x weekly - 1 sets - 10 reps - 10 seconds hold - Standing Sidebends  - 1 x daily - 7 x weekly - 3 sets - 10 reps - 5 seconds hold - Supine March with Posterior Pelvic Tilt  - 1 x daily - 7 x weekly - 3 sets - 10 reps - Hooklying Clamshell with Resistance  - 1 x daily - 7 x weekly - 3 sets - 10 reps - Standing Hip Abduction with Counter Support  - 3 x daily - 7 x weekly - 3 sets - 5 reps - Seated Transversus Abdominis Bracing  - 5 x daily - 7 x weekly - 3 sets - 10 reps - 5 seconds hold  Reclined  Hooklying    Hip extension isometrics, leg straight     R 10x3 with 5 second holds    L 10x3 with 5 second holds     Reviewed and given as part of her HEP. Pt demonstrated and verbalized understanding. Handout provided.    - Sitting to Supine Roll  - 3 x daily - 7 x weekly - 1 sets - 5 reps - Supine Lower Trunk Rotation  - 1 x daily - 7 x weekly - 3 sets - 10 reps      ASSESSMENT:  CLINICAL IMPRESSION:  Improving ability to perform chores at home such as mopping based on subjective reports. Continued working on improving trunk and glute strengthening as well as log rolling to decrease stress to low back when getting out of bed as well as performing chores at home. Good muscle use felt with exercises. Pt tolerated session well without aggravation of symptoms. Pt will benefit from continued skilled physical therapy services to address the aforementioned deficits and to continue to decrease pain, improve strength and function.           OBJECTIVE IMPAIRMENTS: difficulty walking, decreased strength, improper body mechanics, postural dysfunction, and pain.   ACTIVITY LIMITATIONS: carrying, lifting, bending, sitting, standing, stairs, and locomotion  level  PARTICIPATION LIMITATIONS:   PERSONAL FACTORS: Age, Fitness, Past/current experiences, Time since onset of injury/illness/exacerbation, and 3+ comorbidities: anxiety, arthritis, depression, hx of parotid gland tumor,  seizures  are also affecting patient's functional outcome.   REHAB POTENTIAL: Fair    CLINICAL DECISION MAKING: Stable/uncomplicated  EVALUATION COMPLEXITY: Low   GOALS: Goals reviewed with patient? Yes  SHORT TERM GOALS: Target date: 10/13/2023  Pt will be independent with her initial HEP to decrease pain, improve strength, function, and ability to perform tasks that involve standing, and walking more comfortably for her back.  Baseline:Pt has not yet started her initial HEP (10/02/2023); No questions, able to perform (11/08/2023) Goal status: MET   LONG TERM GOALS: Target date: 01/12/2024  Pt will have a decrease in low back pain to 5/10 or less at worst to promote ability to perform standing and walking tasks as well as negotiate stairs more comfortably.  Baseline: low back pain 10/10 at most for the past 3 months (10/02/2023); 8/10 at most (in the mornings) for the past 7 days. Gets better after taking the celebrex which she has been taking for the past 2 years (11/06/2023); 9/10 in the mornings, but feels better after movement. Other than first thing in the morning, 4-5/10 at worst (11/20/2023); 8/10 at worst (in the mornings) for the past 7 days  (11/30/2023) Goal status: Ongoing  2.  Pt will have a decrease in L medial thigh pain to 3/10 or less at worst to promote ability to perform standing tasks as well as get into and out of vehicles more comfortably.   Baseline: L inner thigh pain 8/10 at worst for the past 3 months; 0/10 L inner thigh pain for the past 7 days (11/08/2023) Goal status: MET  3.  Pt will improve her B hip extension and abduction strength by at least 1/2 MMT grade to promote ability to perform standing tasks as well ambulate more comfortably for her  back.  Baseline:  MMT Right eval Left eval R (11/20/2023) L (11/20/2023) R (11/30/2023) L (11/30/2023)  Hip extension (seated manually resitsed) 3- 3- 3 3 4+ 4+  Hip abduction (seated manually resisted) 4 4- 4 4 4  4+   (10/02/2023)   Goal status: Partially met  4.  Pt will improve her Modified Oswestry Low Back Pain disability questionnaire score by at least 10% as a demonstration of improved function.  Baseline: Modified Oswestry Low Back Pain disability questionnaire 52% (10/02/2023); 28% (11/13/2023) Goal status: Met  5.  Pt will improve her 6 minute walk test distance to 1250 ft or more with 3/10 or less back pain to promote ability to ambulate longer distances more comfortably.  Baseline: 1136 ft with 5/10 low back pain afterwards (11/30/2023) Goal status: INITIAL     PLAN:  PT FREQUENCY: 1-2x/week  PT DURATION: 6 weeks  PLANNED INTERVENTIONS: 97110-Therapeutic exercises, 97530- Therapeutic activity, 97112- Neuromuscular re-education, 97535- Self Care, 86578- Manual therapy, [id number]- Aquatic Therapy, 97014- Electrical stimulation (unattended), [id number]- Traction (mechanical), [id number]- Ionotophoresis 4mg /ml Dexamethasone, Patient/Family education, Dry Needling, Joint mobilization, and Spinal mobilization.  PLAN FOR NEXT SESSION: posture, scapular, trunk, glute strengthening, lumbopelvic control, manual techniques, modalities PRN   Isaid Salvia, PT, DPT 12/06/2023, 12:13 PM

## 2023-12-11 ENCOUNTER — Other Ambulatory Visit: Payer: Self-pay

## 2023-12-12 ENCOUNTER — Ambulatory Visit: Payer: Medicare Other

## 2023-12-12 DIAGNOSIS — M5459 Other low back pain: Secondary | ICD-10-CM

## 2023-12-12 DIAGNOSIS — M5416 Radiculopathy, lumbar region: Secondary | ICD-10-CM

## 2023-12-12 NOTE — Therapy (Signed)
 OUTPATIENT PHYSICAL THERAPY TREATMENT    Patient Name: MELENY TREGONING MRN: [id number] DOB:[birth year], 73 y.o., female Today's Date: 12/12/2023  END OF SESSION:  PT End of Session - 12/12/23 1120     Visit Number 16    Number of Visits 25    Date for PT Re-Evaluation 01/12/24    PT Start Time 1121    PT Stop Time 1201    PT Time Calculation (min) 40 min    Activity Tolerance Patient tolerated treatment well    Behavior During Therapy [facility] for tasks assessed/performed                            Past Medical History:  Diagnosis Date   Allergy    Seasonal   Anxiety    Arthritis    neck, wrists,hands, toes, and feet   COVID-19 09/08/2021   Depression    Eustachian tube dysfunction    Family history of adverse reaction to anesthesia    brother - PONV and combative   Genital warts    In past   GERD (gastroesophageal reflux disease)    History of shingles 04/16/2015   March 2016    history of Tumor of parotid gland    Hyperlipidemia    Meniere's disease    slight hearing loss, car sickness and dizziness   Migraines    sinus/stress   Monoclonal gammopathy    Polyarthralgia    PONV (postoperative nausea and vomiting)    and headaches and combative   Pre-diabetes    RLS (restless legs syndrome)    Seizures (HCC)    Dx 16 yrs ago - hormonal - none at least 3 yrs   Seizures (HCC)    H/O due to hormone issues   Past Surgical History:  Procedure Laterality Date   ABDOMINAL HYSTERECTOMY     CATARACT EXTRACTION Bilateral 01/2023   CHOLECYSTECTOMY  09/05/2006   dialation and cartarage  1982, 1999, 2000   DISTAL INTERPHALANGEAL JOINT FUSION Left 05/24/2022   Procedure: Left index and ring DIP fusion;  Surgeon: Kennedy Bucker, MD;  Location: [facility];  Service: Orthopedics;  Laterality: Left;   FINGER SURGERY  2011, 2012   Bone fusion of middle finger right and left hands   FINGER SURGERY  09/06/2011   Pin removed from left middle finger    GANGLION CYST EXCISION  2011, 2012   Left and right hands   MYRINGOTOMY WITH TUBE PLACEMENT Bilateral 05/13/2015   Procedure: MYRINGOTOMY WITH  BUTTERFLY TUBE PLACEMENT;  Surgeon: Bud Face, MD;  Location: [facility];  Service: ENT;  Laterality: Bilateral;   MYRINGOTOMY WITH TUBE PLACEMENT Bilateral 08/28/2019   Procedure: MYRINGOTOMY WITH BUTTERFLY TUBE PLACEMENT;  Surgeon: Bud Face, MD;  Location: [facility];  Service: ENT;  Laterality: Bilateral;   PAROTID GLAND TUMOR EXCISION  09/05/1984   TONSILLECTOMY AND ADENOIDECTOMY  09/05/1957   TUBAL LIGATION  09/05/2008   Patient Active Problem List   Diagnosis Date Noted   Influenza A 10/16/2023   Chronic fatigue 02/27/2023   Hot flashes 02/27/2023   Monoclonal gammopathy 12/29/2021   Polyarthralgia 11/01/2021   Left foot pain 11/01/2021   Restless legs 03/09/2021   Prediabetes 02/26/2019   Chronic neck pain 02/26/2019   Hormone replacement therapy (HRT) 02/22/2018   Meniere disease 02/22/2018   Preventative health care 02/11/2016   Hyperlipidemia 02/11/2016   Insomnia 01/22/2016   Depression with anxiety 04/16/2015  GERD (gastroesophageal reflux disease) 04/16/2015   Arthritis 04/16/2015   ETD (eustachian tube dysfunction) 04/16/2015    PCP: Doreene Nest, NP   REFERRING PROVIDER: Venetia Night, MD   REFERRING DIAG: [phone] (ICD-10-CM) - Neurogenic claudication due to lumbar spinal stenosis  Rationale for Evaluation and Treatment: Rehabilitation  THERAPY DIAG:  Other low back pain  Radiculopathy, lumbar region  ONSET DATE: a year ago  SUBJECTIVE:                                                                                                                                                                                           SUBJECTIVE STATEMENT: Back is about a 3/10 currently. The mornings are better.      PERTINENT HISTORY:  Low back pain with neurogenic  claudication. Has L LE symptoms. She also has L UE pain. Low back pain began about a year ago. Had an MRI for neck and low back. Was referred to Dr. Myer Haff who said that she had a pinched nerve in low back. Had 2 steroid injections for low back, the second injection (December 2024) helped. Has L R and L hip (greater trochanter area bilaterally). Had a steroid injection for L hip 3 months ago which helped.    No hx of osteoporosis No HTN per pt.  No latex allergies   PAIN:  Are you having pain? Yes: NPRS scale: 6/10 Pain location: low back and L inner thigh Pain description: achy, stiff Aggravating factors: stair negotiation, standing (for about 15-20 minutes), walking about 30 minutes in the grocery store, but sometimes a few minutes, sitting (for about 30 minutes) Relieving factors: heating pad , using a shopping cart, sitting in recliner with L leg hanging off to the side.   PRECAUTIONS: None  RED FLAGS: Bowel or bladder incontinence: Yes: both, and both Dr. Myer Haff (does not think it is due to her low back) and Dr. Mariah Milling aware and Cauda equina syndrome: Yes: L thigh tingling.     WEIGHT BEARING RESTRICTIONS: No  FALLS:  Has patient fallen in last 6 months? No  LIVING ENVIRONMENT: Lives with: lives with their spouse Lives in: House/apartment Stairs: Yes: External: 2 steps; none (side of the deck) Has following equipment at home: Grab bars  OCCUPATION: retired   PLOF: Independent  PATIENT GOALS: be able to mop the floor, go to sleep without tossing and turning, be able to walk and not hurt  NEXT MD VISIT: Not yet.  OBJECTIVE:  Note: Objective measures were completed at Evaluation unless otherwise noted.  DIAGNOSTIC FINDINGS:  MR LUMBAR SPINE WO CONTRAST 06/24/2023  Narrative & Impression  CLINICAL  DATA:  Cervicalgia.   Chronic bilateral low back pain with left-sided sciatica.   EXAM: MRI CERVICAL AND LUMBAR SPINE WITHOUT CONTRAST   TECHNIQUE: Multiplanar  and multiecho pulse sequences of the cervical spine, to include the craniocervical junction and cervicothoracic junction, and lumbar spine, were obtained without intravenous contrast.   COMPARISON:  Radiographs cervical spine February 26, 2019. CT cervical spine November 21, 2011.   No prior study of the lumbar spine.   FINDINGS: MRI CERVICAL SPINE FINDINGS   Alignment: Small anterolisthesis small anterolisthesis at C3-4, C4-5, C5-6 and C7-T1, similar to prior radiograph.   Vertebrae: No fracture, evidence of discitis, or bone lesion.   Cord: Normal signal and morphology.   Posterior Fossa, vertebral arteries, paraspinal tissues: A 3 cm right thyroid lobe nodule.   Disc levels:   C1-2: Left-sided joint effusion.   C2-3: Facet degenerative changes. No significant spinal canal or neural foraminal stenosis.   C3-4: Mild disc bulge without significant spinal canal stenosis. Uncovertebral and facet degenerative change resulting in mild bilateral neural foraminal narrowing.   C4-5: Small posterior disc protrusion without significant spinal canal stenosis. Uncovertebral and facet degenerative changes resulting in moderate bilateral neural foraminal narrowing.   C5-6: Small posterior disc osteophyte complex without significant spinal canal stenosis. Uncovertebral and prominent hypertrophic facet degenerative changes resulting in mild right neural foraminal narrowing.   C6-7: Posterior disc osteophyte complex without significant spinal canal stenosis. Uncovertebral and facet degenerative change resulting in mild right and moderate to severe left neural foraminal narrowing.   C7-T1: Facet degenerative changes resulting in mild-to-moderate left neural foraminal narrowing. No spinal canal stenosis.   MRI LUMBAR SPINE FINDINGS   Segmentation:  Standard segmentation is assumed.   Alignment:  Dextroconvex curvature.   Vertebrae: No acute fracture, evidence of discitis, or bone  lesion. Chronic compression fractures of the L3 endplates with associated Schmorl nodes. Endplate degenerative changes throughout the lumbar spine, more pronounced at L4-5.   Conus medullaris and cauda equina: Conus extends to the L1 level. Conus and cauda equina appear normal.   Paraspinal and other soft tissues: Negative.   Disc levels:   T12-L1: No spinal canal or neural foraminal stenosis.   L1-2: Disc bulge with superiorly migrating left central disc extrusion and mild facet degenerative changes. Mild spinal canal stenosis with mild narrowing of the left subarticular zone and mild bilateral neural foraminal narrowing   L2-3: Loss of disc height, left asymmetric disc bulge, moderate facet degenerative changes and ligamentum flavum redundancy resulting in moderate spinal canal stenosis with moderate right and severe left subarticular zone stenosis and moderate bilateral neural foraminal narrowing.   L3-4: Loss of disc height, disc bulge, moderate facet degenerative changes and ligamentum flavum redundancy resulting mild spinal canal stenosis with moderate narrowing of the bilateral subarticular zones, right greater than left, moderate right and mild left neural foraminal narrowing.   L4-5: Loss of disc height, disc bulge, moderate hypertrophic facet degenerative changes and ligamentum flavum redundancy resulting in mild-to-moderate spinal canal stenosis with moderate narrowing of the bilateral subarticular zones and moderate bilateral neural foraminal narrowing.   L5-S1: Shallow disc bulge and moderate facet degenerative changes without significant spinal canal or neural foraminal stenosis.   IMPRESSION: 1. Degenerative changes of the cervical spine without high-grade spinal canal stenosis at any level. 2. Moderate bilateral neural foraminal narrowing at C4-5. 3. Mild right and moderate to severe left neural foraminal narrowing at C6-7. 4. Mild-to-moderate left neural  foraminal narrowing at C7-T1. 5. Moderate spinal canal  stenosis with moderate right and severe left subarticular zone stenosis and moderate bilateral neural foraminal narrowing at L2-3. 6. Mild spinal canal stenosis with moderate narrowing of the bilateral subarticular zones, moderate right and mild left neural foraminal narrowing at L3-4. 7. Mild-to-moderate spinal canal stenosis with moderate narrowing of the bilateral subarticular zones and moderate bilateral neural foraminal narrowing at L4-5. 8. A 3 cm right thyroid lobe nodule. Recommend thyroid ultrasound for further evaluation.     Electronically Signed   By: Baldemar Lenis M.D.   On: 07/11/2023 10:15       PATIENT SURVEYS:  Modified Oswestry 52% (10/02/2023)   COGNITION: Overall cognitive status: Within functional limits for tasks assessed     SENSATION: Not tested    POSTURE: forward neck, B protracted shouldres, R knee slightly higher, R hip in ER, R lumbar convexity, forward flexed, decreased B hip extension, R lateral shift  Decreased low back pain with R lateral shift correction  PALPATION: TTP L greater trochanter L > R   B lumbar paraspinal muscle tenision R > L  TTP low back R > L L5 > L4 > L3 > L2  LUMBAR ROM:   AROM eval  Flexion WFL  Extension Limtied with L low back pain and L L5 dermatome symptoms to L lateral foot.   Right lateral flexion WFL with low back discomfort  Left lateral flexion WFL with low back discomfort  Right rotation full  Left rotation Limited with L L5 pain to foot   (Blank rows = not tested)  LOWER EXTREMITY ROM:     Passive  Right eval Left eval  Hip flexion    Hip extension    Hip abduction    Hip adduction    Hip internal rotation    Hip external rotation    Knee flexion    Knee extension    Ankle dorsiflexion    Ankle plantarflexion    Ankle inversion    Ankle eversion     (Blank rows = not tested)  LOWER EXTREMITY MMT:    MMT  Right eval Left eval  Hip flexion 4 (with low back pain) 4- with L lateral hip pain  Hip extension 3- 3-  Hip abduction (seated manually resisted) 4 4-  Hip adduction    Hip internal rotation    Hip external rotation    Knee flexion 5 4  Knee extension 5 4+ with L L5 dermatome pain  Ankle dorsiflexion    Ankle plantarflexion    Ankle inversion    Ankle eversion     (Blank rows = not tested)  LUMBAR SPECIAL TESTS:  (+) repeated flexion test.   FUNCTIONAL TESTS:    GAIT: Distance walked: 30 ft Assistive device utilized: None Level of assistance: Complete Independence Comments: R lateral lean, decreased stance R LE, antalgic R LE  TREATMENT DATE: 12/12/2023  Therapeutic activities  Log rolling for sit (on R side on table) <> supine 5x  Log rolling for sit (on L side on table) <> supine 5x  Supine lower trunk rotation 10x3 each side to decrease lumbar stiffness.   S/L clamshell  R 5x. R lateral hip pain, 7/10 when performing   L 10x  Hooklying clamshell isometrics with strap 10x with 5 second holds   Then with yellow band 10x5 seconds. Easy per pt     Then with red band 10x with 5 second holds    Posterior pelvic tilt with march 5x, then 10x3  Standing static mini lunge with contralateral UE assist  R 6x. Difficulty today.   L 10x   Then with B UE assist    R 10x   L 10x    Improved exercise technique, movement at target joints, use of target muscles after mod verbal, visual, tactile cues.     PATIENT EDUCATION:  Education details: There-ex, HEP Person educated: Patient Education method: Explanation, Demonstration, Tactile cues, Verbal cues, and Handouts Education comprehension: verbalized understanding and returned demonstration  HOME EXERCISE PROGRAM: Access Code: [email or web address] URL: [email or web address] Date:  11/28/2023 Prepared by: Loralyn Freshwater  Exercises - Seated Flexion Stretch  - 3 x daily - 7 x weekly - 1 sets - 10 reps - 10 seconds hold - Standing Sidebends  - 1 x daily - 7 x weekly - 3 sets - 10 reps - 5 seconds hold - Supine March with Posterior Pelvic Tilt  - 1 x daily - 7 x weekly - 3 sets - 10 reps - Hooklying Clamshell with Resistance  - 1 x daily - 7 x weekly - 3 sets - 10 reps  Yellow band  - Standing Hip Abduction with Counter Support  - 3 x daily - 7 x weekly - 3 sets - 5 reps - Seated Transversus Abdominis Bracing  - 5 x daily - 7 x weekly - 3 sets - 10 reps - 5 seconds hold  Reclined  Hooklying    Hip extension isometrics, leg straight     R 10x3 with 5 second holds    L 10x3 with 5 second holds     Reviewed and given as part of her HEP. Pt demonstrated and verbalized understanding. Handout provided.    - Sitting to Supine Roll  - 3 x daily - 7 x weekly - 1 sets - 5 reps - Supine Lower Trunk Rotation  - 1 x daily - 7 x weekly - 3 sets - 10 reps      ASSESSMENT:  CLINICAL IMPRESSION:  Continued working on improving trunk and glute strength as well as log rolling to decrease stress to low back when getting out of bed as well as performing chores at home.  Pt will benefit from continued skilled physical therapy services to decrease pain, improve strength and function.           OBJECTIVE IMPAIRMENTS: difficulty walking, decreased strength, improper body mechanics, postural dysfunction, and pain.   ACTIVITY LIMITATIONS: carrying, lifting, bending, sitting, standing, stairs, and locomotion level  PARTICIPATION LIMITATIONS:   PERSONAL FACTORS: Age, Fitness, Past/current experiences, Time since onset of injury/illness/exacerbation, and 3+ comorbidities: anxiety, arthritis, depression, hx of parotid gland tumor, seizures  are also affecting patient's functional outcome.   REHAB POTENTIAL: Fair    CLINICAL DECISION MAKING: Stable/uncomplicated  EVALUATION  COMPLEXITY: Low   GOALS: Goals reviewed with patient? Yes  SHORT TERM GOALS: Target date: 10/13/2023  Pt  will be independent with her initial HEP to decrease pain, improve strength, function, and ability to perform tasks that involve standing, and walking more comfortably for her back.  Baseline:Pt has not yet started her initial HEP (10/02/2023); No questions, able to perform (11/08/2023) Goal status: MET   LONG TERM GOALS: Target date: 01/12/2024  Pt will have a decrease in low back pain to 5/10 or less at worst to promote ability to perform standing and walking tasks as well as negotiate stairs more comfortably.  Baseline: low back pain 10/10 at most for the past 3 months (10/02/2023); 8/10 at most (in the mornings) for the past 7 days. Gets better after taking the celebrex which she has been taking for the past 2 years (11/06/2023); 9/10 in the mornings, but feels better after movement. Other than first thing in the morning, 4-5/10 at worst (11/20/2023); 8/10 at worst (in the mornings) for the past 7 days  (11/30/2023) Goal status: Ongoing  2.  Pt will have a decrease in L medial thigh pain to 3/10 or less at worst to promote ability to perform standing tasks as well as get into and out of vehicles more comfortably.   Baseline: L inner thigh pain 8/10 at worst for the past 3 months; 0/10 L inner thigh pain for the past 7 days (11/08/2023) Goal status: MET  3.  Pt will improve her B hip extension and abduction strength by at least 1/2 MMT grade to promote ability to perform standing tasks as well ambulate more comfortably for her back.  Baseline:  MMT Right eval Left eval R (11/20/2023) L (11/20/2023) R (11/30/2023) L (11/30/2023)  Hip extension (seated manually resitsed) 3- 3- 3 3 4+ 4+  Hip abduction (seated manually resisted) 4 4- 4 4 4  4+   (10/02/2023)   Goal status: Partially met  4.  Pt will improve her Modified Oswestry Low Back Pain disability questionnaire score by at least 10% as a  demonstration of improved function.  Baseline: Modified Oswestry Low Back Pain disability questionnaire 52% (10/02/2023); 28% (11/13/2023) Goal status: Met  5.  Pt will improve her 6 minute walk test distance to 1250 ft or more with 3/10 or less back pain to promote ability to ambulate longer distances more comfortably.  Baseline: 1136 ft with 5/10 low back pain afterwards (11/30/2023) Goal status: INITIAL     PLAN:  PT FREQUENCY: 1-2x/week  PT DURATION: 6 weeks  PLANNED INTERVENTIONS: 97110-Therapeutic exercises, 97530- Therapeutic activity, 97112- Neuromuscular re-education, 97535- Self Care, 14782- Manual therapy, [id number]- Aquatic Therapy, 97014- Electrical stimulation (unattended), [id number]- Traction (mechanical), [id number]- Ionotophoresis 4mg /ml Dexamethasone, Patient/Family education, Dry Needling, Joint mobilization, and Spinal mobilization.  PLAN FOR NEXT SESSION: posture, scapular, trunk, glute strengthening, lumbopelvic control, manual techniques, modalities PRN   Lyly Canizales, PT, DPT 12/12/2023, 12:13 PM

## 2023-12-15 ENCOUNTER — Ambulatory Visit: Payer: Medicare Other

## 2023-12-18 ENCOUNTER — Other Ambulatory Visit: Payer: Self-pay | Admitting: Primary Care

## 2023-12-18 DIAGNOSIS — J3089 Other allergic rhinitis: Secondary | ICD-10-CM

## 2023-12-22 ENCOUNTER — Other Ambulatory Visit: Payer: Self-pay | Admitting: Primary Care

## 2023-12-22 DIAGNOSIS — K219 Gastro-esophageal reflux disease without esophagitis: Secondary | ICD-10-CM

## 2023-12-22 NOTE — Telephone Encounter (Signed)
Patient is due for CPE/follow up in early August, this will be required prior to any further refills.  Please schedule, thank you!

## 2023-12-25 NOTE — Telephone Encounter (Signed)
 Patient scheduled.

## 2024-01-01 ENCOUNTER — Ambulatory Visit

## 2024-01-01 DIAGNOSIS — M5459 Other low back pain: Secondary | ICD-10-CM | POA: Diagnosis not present

## 2024-01-01 DIAGNOSIS — M5416 Radiculopathy, lumbar region: Secondary | ICD-10-CM

## 2024-01-01 NOTE — Therapy (Signed)
 OUTPATIENT PHYSICAL THERAPY TREATMENT    Patient Name: JASLYNN RISER MRN: [id number] DOB:[birth year], 73 y.o., female Today's Date: 01/01/2024  END OF SESSION:  PT End of Session - 01/01/24 1304     Visit Number 17    Number of Visits 25    Date for PT Re-Evaluation 01/12/24    PT Start Time 1304    PT Stop Time 1347    PT Time Calculation (min) 43 min    Activity Tolerance Patient tolerated treatment well    Behavior During Therapy [facility] for tasks assessed/performed                             Past Medical History:  Diagnosis Date   Allergy    Seasonal   Anxiety    Arthritis    neck, wrists,hands, toes, and feet   COVID-19 09/08/2021   Depression    Eustachian tube dysfunction    Family history of adverse reaction to anesthesia    brother - PONV and combative   Genital warts    In past   GERD (gastroesophageal reflux disease)    History of shingles 04/16/2015   March 2016    history of Tumor of parotid gland    Hyperlipidemia    Meniere's disease    slight hearing loss, car sickness and dizziness   Migraines    sinus/stress   Monoclonal gammopathy    Polyarthralgia    PONV (postoperative nausea and vomiting)    and headaches and combative   Pre-diabetes    RLS (restless legs syndrome)    Seizures (HCC)    Dx 16 yrs ago - hormonal - none at least 3 yrs   Seizures (HCC)    H/O due to hormone issues   Past Surgical History:  Procedure Laterality Date   ABDOMINAL HYSTERECTOMY     CATARACT EXTRACTION Bilateral 01/2023   CHOLECYSTECTOMY  09/05/2006   dialation and cartarage  1982, 1999, 2000   DISTAL INTERPHALANGEAL JOINT FUSION Left 05/24/2022   Procedure: Left index and ring DIP fusion;  Surgeon: Molli Angelucci, MD;  Location: [facility];  Service: Orthopedics;  Laterality: Left;   FINGER SURGERY  2011, 2012   Bone fusion of middle finger right and left hands   FINGER SURGERY  09/06/2011   Pin removed from left middle finger    GANGLION CYST EXCISION  2011, 2012   Left and right hands   MYRINGOTOMY WITH TUBE PLACEMENT Bilateral 05/13/2015   Procedure: MYRINGOTOMY WITH  BUTTERFLY TUBE PLACEMENT;  Surgeon: Rogers Clayman, MD;  Location: [facility];  Service: ENT;  Laterality: Bilateral;   MYRINGOTOMY WITH TUBE PLACEMENT Bilateral 08/28/2019   Procedure: MYRINGOTOMY WITH BUTTERFLY TUBE PLACEMENT;  Surgeon: Rogers Clayman, MD;  Location: [facility];  Service: ENT;  Laterality: Bilateral;   PAROTID GLAND TUMOR EXCISION  09/05/1984   TONSILLECTOMY AND ADENOIDECTOMY  09/05/1957   TUBAL LIGATION  09/05/2008   Patient Active Problem List   Diagnosis Date Noted   Influenza A 10/16/2023   Chronic fatigue 02/27/2023   Hot flashes 02/27/2023   Monoclonal gammopathy 12/29/2021   Polyarthralgia 11/01/2021   Left foot pain 11/01/2021   Restless legs 03/09/2021   Prediabetes 02/26/2019   Chronic neck pain 02/26/2019   Hormone replacement therapy (HRT) 02/22/2018   Meniere disease 02/22/2018   Preventative health care 02/11/2016   Hyperlipidemia 02/11/2016   Insomnia 01/22/2016   Depression with anxiety 04/16/2015  GERD (gastroesophageal reflux disease) 04/16/2015   Arthritis 04/16/2015   ETD (eustachian tube dysfunction) 04/16/2015    PCP: Gabriel John, NP   REFERRING PROVIDER: Jodeen Munch, MD   REFERRING DIAG: [phone] (ICD-10-CM) - Neurogenic claudication due to lumbar spinal stenosis  Rationale for Evaluation and Treatment: Rehabilitation  THERAPY DIAG:  Other low back pain  Radiculopathy, lumbar region  ONSET DATE: a year ago  SUBJECTIVE:                                                                                                                                                                                           SUBJECTIVE STATEMENT: Had Meniere's last week. Feeling better. Feels like its related to her allergies. R lateral hip (greater trochanter)  5/10  currently. R lateral hip pain keeps her up at night. Low back is 4/10 currently The mornings are better. 8/10 at worst for the past 7 day. The mornings area ok. The vacuuming and mopping is fine.     PERTINENT HISTORY:  Low back pain with neurogenic claudication. Has L LE symptoms. She also has L UE pain. Low back pain began about a year ago. Had an MRI for neck and low back. Was referred to Dr. Mont Antis who said that she had a pinched nerve in low back. Had 2 steroid injections for low back, the second injection (December 2024) helped. Has L R and L hip (greater trochanter area bilaterally). Had a steroid injection for L hip 3 months ago which helped.    No hx of osteoporosis No HTN per pt.  No latex allergies   PAIN:  Are you having pain? Yes: NPRS scale: 6/10 Pain location: low back and L inner thigh Pain description: achy, stiff Aggravating factors: stair negotiation, standing (for about 15-20 minutes), walking about 30 minutes in the grocery store, but sometimes a few minutes, sitting (for about 30 minutes) Relieving factors: heating pad , using a shopping cart, sitting in recliner with L leg hanging off to the side.   PRECAUTIONS: None  RED FLAGS: Bowel or bladder incontinence: Yes: both, and both Dr. Mont Antis (does not think it is due to her low back) and Dr. Aleen Ammons aware and Cauda equina syndrome: Yes: L thigh tingling.     WEIGHT BEARING RESTRICTIONS: No  FALLS:  Has patient fallen in last 6 months? No  LIVING ENVIRONMENT: Lives with: lives with their spouse Lives in: House/apartment Stairs: Yes: External: 2 steps; none (side of the deck) Has following equipment at home: Grab bars  OCCUPATION: retired   PLOF: Independent  PATIENT GOALS: be able to mop the floor, go to sleep  without tossing and turning, be able to walk and not hurt  NEXT MD VISIT: Not yet.  OBJECTIVE:  Note: Objective measures were completed at Evaluation unless otherwise noted.  DIAGNOSTIC  FINDINGS:  MR LUMBAR SPINE WO CONTRAST 06/24/2023  Narrative & Impression  CLINICAL DATA:  Cervicalgia.   Chronic bilateral low back pain with left-sided sciatica.   EXAM: MRI CERVICAL AND LUMBAR SPINE WITHOUT CONTRAST   TECHNIQUE: Multiplanar and multiecho pulse sequences of the cervical spine, to include the craniocervical junction and cervicothoracic junction, and lumbar spine, were obtained without intravenous contrast.   COMPARISON:  Radiographs cervical spine February 26, 2019. CT cervical spine November 21, 2011.   No prior study of the lumbar spine.   FINDINGS: MRI CERVICAL SPINE FINDINGS   Alignment: Small anterolisthesis small anterolisthesis at C3-4, C4-5, C5-6 and C7-T1, similar to prior radiograph.   Vertebrae: No fracture, evidence of discitis, or bone lesion.   Cord: Normal signal and morphology.   Posterior Fossa, vertebral arteries, paraspinal tissues: A 3 cm right thyroid  lobe nodule.   Disc levels:   C1-2: Left-sided joint effusion.   C2-3: Facet degenerative changes. No significant spinal canal or neural foraminal stenosis.   C3-4: Mild disc bulge without significant spinal canal stenosis. Uncovertebral and facet degenerative change resulting in mild bilateral neural foraminal narrowing.   C4-5: Small posterior disc protrusion without significant spinal canal stenosis. Uncovertebral and facet degenerative changes resulting in moderate bilateral neural foraminal narrowing.   C5-6: Small posterior disc osteophyte complex without significant spinal canal stenosis. Uncovertebral and prominent hypertrophic facet degenerative changes resulting in mild right neural foraminal narrowing.   C6-7: Posterior disc osteophyte complex without significant spinal canal stenosis. Uncovertebral and facet degenerative change resulting in mild right and moderate to severe left neural foraminal narrowing.   C7-T1: Facet degenerative changes resulting in  mild-to-moderate left neural foraminal narrowing. No spinal canal stenosis.   MRI LUMBAR SPINE FINDINGS   Segmentation:  Standard segmentation is assumed.   Alignment:  Dextroconvex curvature.   Vertebrae: No acute fracture, evidence of discitis, or bone lesion. Chronic compression fractures of the L3 endplates with associated Schmorl nodes. Endplate degenerative changes throughout the lumbar spine, more pronounced at L4-5.   Conus medullaris and cauda equina: Conus extends to the L1 level. Conus and cauda equina appear normal.   Paraspinal and other soft tissues: Negative.   Disc levels:   T12-L1: No spinal canal or neural foraminal stenosis.   L1-2: Disc bulge with superiorly migrating left central disc extrusion and mild facet degenerative changes. Mild spinal canal stenosis with mild narrowing of the left subarticular zone and mild bilateral neural foraminal narrowing   L2-3: Loss of disc height, left asymmetric disc bulge, moderate facet degenerative changes and ligamentum flavum redundancy resulting in moderate spinal canal stenosis with moderate right and severe left subarticular zone stenosis and moderate bilateral neural foraminal narrowing.   L3-4: Loss of disc height, disc bulge, moderate facet degenerative changes and ligamentum flavum redundancy resulting mild spinal canal stenosis with moderate narrowing of the bilateral subarticular zones, right greater than left, moderate right and mild left neural foraminal narrowing.   L4-5: Loss of disc height, disc bulge, moderate hypertrophic facet degenerative changes and ligamentum flavum redundancy resulting in mild-to-moderate spinal canal stenosis with moderate narrowing of the bilateral subarticular zones and moderate bilateral neural foraminal narrowing.   L5-S1: Shallow disc bulge and moderate facet degenerative changes without significant spinal canal or neural foraminal stenosis.   IMPRESSION: 1.  Degenerative  changes of the cervical spine without high-grade spinal canal stenosis at any level. 2. Moderate bilateral neural foraminal narrowing at C4-5. 3. Mild right and moderate to severe left neural foraminal narrowing at C6-7. 4. Mild-to-moderate left neural foraminal narrowing at C7-T1. 5. Moderate spinal canal stenosis with moderate right and severe left subarticular zone stenosis and moderate bilateral neural foraminal narrowing at L2-3. 6. Mild spinal canal stenosis with moderate narrowing of the bilateral subarticular zones, moderate right and mild left neural foraminal narrowing at L3-4. 7. Mild-to-moderate spinal canal stenosis with moderate narrowing of the bilateral subarticular zones and moderate bilateral neural foraminal narrowing at L4-5. 8. A 3 cm right thyroid  lobe nodule. Recommend thyroid  ultrasound for further evaluation.     Electronically Signed   By: Katyucia  de Macedo Rodrigues M.D.   On: 07/11/2023 10:15       PATIENT SURVEYS:  Modified Oswestry 52% (10/02/2023)   COGNITION: Overall cognitive status: Within functional limits for tasks assessed     SENSATION: Not tested    POSTURE: forward neck, B protracted shouldres, R knee slightly higher, R hip in ER, R lumbar convexity, forward flexed, decreased B hip extension, R lateral shift  Decreased low back pain with R lateral shift correction  PALPATION: TTP L greater trochanter L > R   B lumbar paraspinal muscle tenision R > L  TTP low back R > L L5 > L4 > L3 > L2  LUMBAR ROM:   AROM eval  Flexion WFL  Extension Limtied with L low back pain and L L5 dermatome symptoms to L lateral foot.   Right lateral flexion WFL with low back discomfort  Left lateral flexion WFL with low back discomfort  Right rotation full  Left rotation Limited with L L5 pain to foot   (Blank rows = not tested)  LOWER EXTREMITY ROM:     Passive  Right eval Left eval  Hip flexion    Hip extension    Hip  abduction    Hip adduction    Hip internal rotation    Hip external rotation    Knee flexion    Knee extension    Ankle dorsiflexion    Ankle plantarflexion    Ankle inversion    Ankle eversion     (Blank rows = not tested)  LOWER EXTREMITY MMT:    MMT Right eval Left eval  Hip flexion 4 (with low back pain) 4- with L lateral hip pain  Hip extension 3- 3-  Hip abduction (seated manually resisted) 4 4-  Hip adduction    Hip internal rotation    Hip external rotation    Knee flexion 5 4  Knee extension 5 4+ with L L5 dermatome pain  Ankle dorsiflexion    Ankle plantarflexion    Ankle inversion    Ankle eversion     (Blank rows = not tested)  LUMBAR SPECIAL TESTS:  (+) repeated flexion test.   FUNCTIONAL TESTS:    GAIT: Distance walked: 30 ft Assistive device utilized: None Level of assistance: Complete Independence Comments: R lateral lean, decreased stance R LE, antalgic R LE  TREATMENT DATE: 01/01/2024  Therapeutic activities  TTP R greater trochanter  Sitting with lumbar towel roll   Feels better for low back    Then with lumbar extension 10x5 seconds for 3 sets   Decreased low back pain   Hooklying clamshell red band 10x5 seconds for 3 sets  Standing static mini lunge onto first regular step with B UE assist with emphasis on femoral control   R 10x2   L 10x2   SLS with B UE assist with emphasis on level pelvis to promote glute med strength and lumbopelvic control during gait.   R 10x5 seconds for 2 sets  L 10x5 seconds for 2 sets      Improved exercise technique, movement at target joints, use of target muscles after mod verbal, visual, tactile cues.     PATIENT EDUCATION:  Education details: There-ex, HEP Person educated: Patient Education method: Explanation, Demonstration, Tactile cues, Verbal cues, and  Handouts Education comprehension: verbalized understanding and returned demonstration  HOME EXERCISE PROGRAM: Access Code: [email or web address] URL: [email or web address] Date: 11/28/2023 Prepared by: Suzzane Estes  Exercises - Seated Flexion Stretch  - 3 x daily - 7 x weekly - 1 sets - 10 reps - 10 seconds hold - Standing Sidebends  - 1 x daily - 7 x weekly - 3 sets - 10 reps - 5 seconds hold - Supine March with Posterior Pelvic Tilt  - 1 x daily - 7 x weekly - 3 sets - 10 reps - Hooklying Clamshell with Resistance  - 1 x daily - 7 x weekly - 3 sets - 10 reps  Yellow band  Upgraded to red band with 10x3 with 5 second holds on 01/01/2024  - Standing Hip Abduction with Counter Support  - 3 x daily - 7 x weekly - 3 sets - 5 reps - Seated Transversus Abdominis Bracing  - 5 x daily - 7 x weekly - 3 sets - 10 reps - 5 seconds hold  Reclined  Hooklying    Hip extension isometrics, leg straight     R 10x3 with 5 second holds    L 10x3 with 5 second holds     Reviewed and given as part of her HEP. Pt demonstrated and verbalized understanding. Handout provided.    - Sitting to Supine Roll  - 3 x daily - 7 x weekly - 1 sets - 5 reps - Supine Lower Trunk Rotation  - 1 x daily - 7 x weekly - 3 sets - 10 reps  sitting with lumbar towel roll   Then with lumbar extension 10x5 seconds for 3 sets    ASSESSMENT:  CLINICAL IMPRESSION:  Decreased low back pain with gentle lumbar extension. TTP R greater trochanter suggesting bursitis. Worked on concentric glute med muscle activation as well as maintaining level pelvis while on R LE to promote proper stress for healing as well as to improve glute med strength to help decrease excessive stress to R greater trochanter while performing closed chain tasks. No low back or R lateral hip pain reported after session.  Pt will benefit from continued skilled physical therapy services to decrease pain, improve strength and function.            OBJECTIVE IMPAIRMENTS: difficulty walking, decreased strength, improper body mechanics, postural dysfunction, and pain.   ACTIVITY LIMITATIONS: carrying, lifting, bending, sitting, standing, stairs, and locomotion level  PARTICIPATION LIMITATIONS:   PERSONAL FACTORS: Age, Fitness, Past/current experiences, Time since onset of injury/illness/exacerbation, and 3+ comorbidities: anxiety, arthritis, depression, hx of parotid  gland tumor, seizures  are also affecting patient's functional outcome.   REHAB POTENTIAL: Fair    CLINICAL DECISION MAKING: Stable/uncomplicated  EVALUATION COMPLEXITY: Low   GOALS: Goals reviewed with patient? Yes  SHORT TERM GOALS: Target date: 10/13/2023  Pt will be independent with her initial HEP to decrease pain, improve strength, function, and ability to perform tasks that involve standing, and walking more comfortably for her back.  Baseline:Pt has not yet started her initial HEP (10/02/2023); No questions, able to perform (11/08/2023) Goal status: MET   LONG TERM GOALS: Target date: 01/12/2024  Pt will have a decrease in low back pain to 5/10 or less at worst to promote ability to perform standing and walking tasks as well as negotiate stairs more comfortably.  Baseline: low back pain 10/10 at most for the past 3 months (10/02/2023); 8/10 at most (in the mornings) for the past 7 days. Gets better after taking the celebrex  which she has been taking for the past 2 years (11/06/2023); 9/10 in the mornings, but feels better after movement. Other than first thing in the morning, 4-5/10 at worst (11/20/2023); 8/10 at worst (in the mornings) for the past 7 days  (11/30/2023) Goal status: Ongoing  2.  Pt will have a decrease in L medial thigh pain to 3/10 or less at worst to promote ability to perform standing tasks as well as get into and out of vehicles more comfortably.   Baseline: L inner thigh pain 8/10 at worst for the past 3 months; 0/10 L inner thigh pain for the  past 7 days (11/08/2023) Goal status: MET  3.  Pt will improve her B hip extension and abduction strength by at least 1/2 MMT grade to promote ability to perform standing tasks as well ambulate more comfortably for her back.  Baseline:  MMT Right eval Left eval R (11/20/2023) L (11/20/2023) R (11/30/2023) L (11/30/2023)  Hip extension (seated manually resitsed) 3- 3- 3 3 4+ 4+  Hip abduction (seated manually resisted) 4 4- 4 4 4  4+   (10/02/2023)   Goal status: Partially met  4.  Pt will improve her Modified Oswestry Low Back Pain disability questionnaire score by at least 10% as a demonstration of improved function.  Baseline: Modified Oswestry Low Back Pain disability questionnaire 52% (10/02/2023); 28% (11/13/2023) Goal status: Met  5.  Pt will improve her 6 minute walk test distance to 1250 ft or more with 3/10 or less back pain to promote ability to ambulate longer distances more comfortably.  Baseline: 1136 ft with 5/10 low back pain afterwards (11/30/2023) Goal status: INITIAL     PLAN:  PT FREQUENCY: 1-2x/week  PT DURATION: 6 weeks  PLANNED INTERVENTIONS: 97110-Therapeutic exercises, 97530- Therapeutic activity, 97112- Neuromuscular re-education, 97535- Self Care, [id number]- Manual therapy, [id number]- Aquatic Therapy, 97014- Electrical stimulation (unattended), [id number]- Traction (mechanical), [id number]- Ionotophoresis 4mg /ml Dexamethasone , Patient/Family education, Dry Needling, Joint mobilization, and Spinal mobilization.  PLAN FOR NEXT SESSION: posture, scapular, trunk, glute strengthening, lumbopelvic control, manual techniques, modalities PRN   Asir Bingley, PT, DPT 01/01/2024, 1:51 PM

## 2024-01-03 ENCOUNTER — Ambulatory Visit

## 2024-01-09 ENCOUNTER — Ambulatory Visit

## 2024-01-11 ENCOUNTER — Ambulatory Visit

## 2024-01-11 ENCOUNTER — Other Ambulatory Visit: Payer: Self-pay

## 2024-02-02 ENCOUNTER — Other Ambulatory Visit: Payer: Self-pay | Admitting: *Deleted

## 2024-02-02 DIAGNOSIS — D472 Monoclonal gammopathy: Secondary | ICD-10-CM

## 2024-02-05 ENCOUNTER — Inpatient Hospital Stay: Payer: Medicare Other | Attending: Internal Medicine

## 2024-02-05 ENCOUNTER — Ambulatory Visit: Payer: Medicare Other | Admitting: Internal Medicine

## 2024-02-05 DIAGNOSIS — Z800 Family history of malignant neoplasm of digestive organs: Secondary | ICD-10-CM | POA: Diagnosis not present

## 2024-02-05 DIAGNOSIS — E785 Hyperlipidemia, unspecified: Secondary | ICD-10-CM | POA: Diagnosis not present

## 2024-02-05 DIAGNOSIS — M1990 Unspecified osteoarthritis, unspecified site: Secondary | ICD-10-CM | POA: Diagnosis not present

## 2024-02-05 DIAGNOSIS — G2581 Restless legs syndrome: Secondary | ICD-10-CM | POA: Insufficient documentation

## 2024-02-05 DIAGNOSIS — Z8616 Personal history of COVID-19: Secondary | ICD-10-CM | POA: Diagnosis not present

## 2024-02-05 DIAGNOSIS — M25559 Pain in unspecified hip: Secondary | ICD-10-CM | POA: Diagnosis not present

## 2024-02-05 DIAGNOSIS — D472 Monoclonal gammopathy: Secondary | ICD-10-CM | POA: Diagnosis not present

## 2024-02-05 DIAGNOSIS — M549 Dorsalgia, unspecified: Secondary | ICD-10-CM | POA: Diagnosis not present

## 2024-02-05 DIAGNOSIS — E538 Deficiency of other specified B group vitamins: Secondary | ICD-10-CM | POA: Insufficient documentation

## 2024-02-05 LAB — CBC WITH DIFFERENTIAL (CANCER CENTER ONLY)
Abs Immature Granulocytes: 0.01 10*3/uL (ref 0.00–0.07)
Basophils Absolute: 0 10*3/uL (ref 0.0–0.1)
Basophils Relative: 1 %
Eosinophils Absolute: 0.1 10*3/uL (ref 0.0–0.5)
Eosinophils Relative: 3 %
HCT: 37.2 % (ref 36.0–46.0)
Hemoglobin: 12.3 g/dL (ref 12.0–15.0)
Immature Granulocytes: 0 %
Lymphocytes Relative: 22 %
Lymphs Abs: 0.9 10*3/uL (ref 0.7–4.0)
MCH: 32.2 pg (ref 26.0–34.0)
MCHC: 33.1 g/dL (ref 30.0–36.0)
MCV: 97.4 fL (ref 80.0–100.0)
Monocytes Absolute: 0.4 10*3/uL (ref 0.1–1.0)
Monocytes Relative: 9 %
Neutro Abs: 2.6 10*3/uL (ref 1.7–7.7)
Neutrophils Relative %: 65 %
Platelet Count: 320 10*3/uL (ref 150–400)
RBC: 3.82 MIL/uL — ABNORMAL LOW (ref 3.87–5.11)
RDW: 12.3 % (ref 11.5–15.5)
WBC Count: 4 10*3/uL (ref 4.0–10.5)
nRBC: 0 % (ref 0.0–0.2)

## 2024-02-05 LAB — CMP (CANCER CENTER ONLY)
ALT: 15 U/L (ref 0–44)
AST: 19 U/L (ref 15–41)
Albumin: 3.9 g/dL (ref 3.5–5.0)
Alkaline Phosphatase: 58 U/L (ref 38–126)
Anion gap: 9 (ref 5–15)
BUN: 22 mg/dL (ref 8–23)
CO2: 25 mmol/L (ref 22–32)
Calcium: 8.7 mg/dL — ABNORMAL LOW (ref 8.9–10.3)
Chloride: 100 mmol/L (ref 98–111)
Creatinine: 0.49 mg/dL (ref 0.44–1.00)
GFR, Estimated: >60 mL/min (ref >60)
Glucose, Bld: 89 mg/dL (ref 70–99)
Potassium: 4.5 mmol/L (ref 3.5–5.1)
Sodium: 134 mmol/L — ABNORMAL LOW (ref 135–145)
Total Bilirubin: 0.5 mg/dL (ref 0.0–1.2)
Total Protein: 7.2 g/dL (ref 6.5–8.1)

## 2024-02-05 LAB — IRON AND TIBC
Iron: 83 ug/dL (ref 28–170)
Saturation Ratios: 22 % (ref 10.4–31.8)
TIBC: 381 ug/dL (ref 250–450)
UIBC: 298 ug/dL

## 2024-02-05 LAB — LACTATE DEHYDROGENASE: LDH: 145 U/L (ref 98–192)

## 2024-02-05 LAB — VITAMIN D 25 HYDROXY (VIT D DEFICIENCY, FRACTURES): Vit D, 25-Hydroxy: 47.9 ng/mL (ref 30–100)

## 2024-02-05 LAB — VITAMIN B12: Vitamin B-12: 365 pg/mL (ref 180–914)

## 2024-02-05 LAB — FERRITIN: Ferritin: 45 ng/mL (ref 11–307)

## 2024-02-06 LAB — KAPPA/LAMBDA LIGHT CHAINS
Kappa free light chain: 18.2 mg/L (ref 3.3–19.4)
Kappa, lambda light chain ratio: 0.97 (ref 0.26–1.65)
Lambda free light chains: 18.8 mg/L (ref 5.7–26.3)

## 2024-02-07 LAB — MULTIPLE MYELOMA PANEL, SERUM
Albumin SerPl Elph-Mcnc: 3.7 g/dL (ref 2.9–4.4)
Albumin/Glob SerPl: 1.4 (ref 0.7–1.7)
Alpha 1: 0.2 g/dL (ref 0.0–0.4)
Alpha2 Glob SerPl Elph-Mcnc: 0.6 g/dL (ref 0.4–1.0)
B-Globulin SerPl Elph-Mcnc: 1 g/dL (ref 0.7–1.3)
Gamma Glob SerPl Elph-Mcnc: 1 g/dL (ref 0.4–1.8)
Globulin, Total: 2.8 g/dL (ref 2.2–3.9)
IgA: 262 mg/dL (ref 87–352)
IgG (Immunoglobin G), Serum: 910 mg/dL (ref 586–1602)
IgM (Immunoglobulin M), Srm: 293 mg/dL — ABNORMAL HIGH (ref 26–217)
M Protein SerPl Elph-Mcnc: 0.3 g/dL — ABNORMAL HIGH
Total Protein ELP: 6.5 g/dL (ref 6.0–8.5)

## 2024-02-12 ENCOUNTER — Ambulatory Visit (INDEPENDENT_AMBULATORY_CARE_PROVIDER_SITE_OTHER): Payer: Medicare Other

## 2024-02-12 VITALS — BP 124/76 | Ht 64.0 in | Wt 149.0 lb

## 2024-02-12 DIAGNOSIS — Z0000 Encounter for general adult medical examination without abnormal findings: Secondary | ICD-10-CM | POA: Diagnosis not present

## 2024-02-12 DIAGNOSIS — Z2821 Immunization not carried out because of patient refusal: Secondary | ICD-10-CM

## 2024-02-12 NOTE — Patient Instructions (Signed)
 Ms. Tanton , Thank you for taking time out of your busy schedule to complete your Annual Wellness Visit with me. I enjoyed our conversation and look forward to speaking with you again next year. I, as well as your care team,  appreciate your ongoing commitment to your health goals. Please review the following plan we discussed and let me know if I can assist you in the future. Your Game plan/ To Do List    Referrals: If you haven't heard from the office you've been referred to, please reach out to them at the phone provided.  None  Follow up Visits: Next Medicare AWV with our clinical staff: 02/12/2025   Have you seen your provider in the last 6 months (3 months if uncontrolled diabetes)? No Next Office Visit with your provider: 04/05/2024  Clinician Recommendations:  Aim for 30 minutes of exercise or brisk walking, 6-8 glasses of water, and 5 servings of fruits and vegetables each day.       This is a list of the screening recommended for you and due dates:  Health Maintenance  Topic Date Due   Zoster (Shingles) Vaccine (1 of 2) [birth year]/1972   Flu Shot  04/05/2024   Mammogram  05/13/2024   Medicare Annual Wellness Visit  02/11/2025   DTaP/Tdap/Td vaccine (2 - Tdap) 02/18/2027   Colon Cancer Screening  11/01/2033   Pneumonia Vaccine  Completed   DEXA scan (bone density measurement)  Completed   Hepatitis C Screening  Completed   HPV Vaccine  Aged Out   Meningitis B Vaccine  Aged Out   COVID-19 Vaccine  Discontinued    Advanced directives: (Declined) Advance directive discussed with you today. Even though you declined this today, please call our office should you change your mind, and we can give you the proper paperwork for you to fill out. Advance Care Planning is important because it:  [x]  Makes sure you receive the medical care that is consistent with your values, goals, and preferences  [x]  It provides guidance to your family and loved ones and reduces their decisional burden  about whether or not they are making the right decisions based on your wishes.  Follow the link provided in your after visit summary or read over the paperwork we have mailed to you to help you started getting your Advance Directives in place. If you need assistance in completing these, please reach out to us  so that we can help you!  See attachments for Preventive Care and Fall Prevention Tips.

## 2024-02-12 NOTE — Progress Notes (Signed)
 Because this visit was a virtual/telehealth visit,  certain criteria was not obtained, such a blood pressure, CBG if applicable, and timed get up and go. Any medications not marked as "taking" were not mentioned during the medication reconciliation part of the visit. Any vitals not documented were not able to be obtained due to this being a telehealth visit or patient was unable to self-report a recent blood pressure reading due to a lack of equipment at home via telehealth. Vitals that have been documented are verbally provided by the patient.   This visit was performed by a medical professional under my direct supervision. I was immediately available for consultation/collaboration. I have reviewed and agree with the Annual Wellness Visit documentation.  Subjective:   April Nguyen is a 73 y.o. who presents for a Medicare Wellness preventive visit.  As a reminder, Annual Wellness Visits don't include a physical exam, and some assessments may be limited, especially if this visit is performed virtually. We may recommend an in-person follow-up visit with your provider if needed.  Visit Complete: Virtual I connected with  April Nguyen on 02/12/24 by a audio enabled telemedicine application and verified that I am speaking with the correct person using two identifiers.  Patient Location: Home  Provider Location: Home Office  I discussed the limitations of evaluation and management by telemedicine. The patient expressed understanding and agreed to proceed.  Vital Signs: Because this visit was a virtual/telehealth visit, some criteria may be missing or patient reported. Any vitals not documented were not able to be obtained and vitals that have been documented are patient reported.  VideoDeclined- This patient declined Librarian, academic. Therefore the visit was completed with audio only.  Persons Participating in Visit: Patient.  AWV Questionnaire: Yes: Patient  Medicare AWV questionnaire was completed by the patient on 02/08/2024; I have confirmed that all information answered by patient is correct and no changes since this date.  Cardiac Risk Factors include: advanced age (>90men, >67 women);dyslipidemia     Objective:     Today's Vitals   02/12/24 1319  BP: 124/76  Weight: 149 lb (67.6 kg)  Height: 5\' 4"  (1.626 m)   Body mass index is 25.58 kg/m.     02/12/2024    1:18 PM 10/15/2023    8:37 AM 10/02/2023    9:53 AM 02/08/2023    1:37 PM 02/03/2023   10:48 AM 07/25/2022    1:33 PM 05/24/2022   11:01 AM  Advanced Directives  Does Patient Have a Medical Advance Directive? No No No No No No No  Would patient like information on creating a medical advance directive? No - Patient declined No - Patient declined Yes (MAU/Ambulatory/Procedural Areas - Information given) No - Patient declined Yes (MAU/Ambulatory/Procedural Areas - Information given) No - Patient declined No - Patient declined    Current Medications (verified) Outpatient Encounter Medications as of 02/12/2024  Medication Sig   Ascorbic Acid (VITAMIN C) 1000 MG tablet Take 1,800 mg by mouth daily.   celecoxib  (CELEBREX ) 100 MG capsule Take 100 mg by mouth 2 (two) times daily. 100 mg AM, 200 mg at dinner   celecoxib  (CELEBREX ) 100 MG capsule Take up to 2 capsules (200 mg total) by mouth 2 (two) times daily as needed for pain.   cetirizine  (ZYRTEC ) 10 MG tablet Take 1 tablet (10 mg total) by mouth daily. (Patient taking differently: Take 10 mg by mouth daily. am)   Ergocalciferol  (VITAMIN D2) 10 MCG (400 UNIT) TABS  Take 800 Units by mouth daily.   estradiol  (ESTRACE ) 0.1 MG/GM vaginal cream APPLY TWO TO THREE TIMES WEEKLY   famotidine  (PEPCID ) 20 MG tablet TAKE ONE TABLET (20 MG TOTAL) BY MOUTH DAILY. FOR HEARTBURN.   fluticasone  (FLONASE ) 50 MCG/ACT nasal spray PLACE ONE SPRAY INTO BOTH NOSTRILS TWO TIMES DAILY AS NEEDED FOR ALLERGIES OR RHINITIS.   gabapentin  (NEURONTIN ) 600 MG tablet  Take 1 tablet (600 mg total) by mouth at bedtime. For restless legs   hydrOXYzine  (ATARAX /VISTARIL ) 10 MG tablet Take 1-2 tablets by mouth twice daily as needed for long car rides.   meclizine  (ANTIVERT ) 25 MG tablet Take 1 tablet (25 mg total) by mouth 3 (three) times daily as needed for dizziness (take as needed not with other antihistimines).   omeprazole  (PRILOSEC) 20 MG capsule TAKE ONE CAPSULE BY MOUTH ONCE DAILY FOR HEARTBURN   PARoxetine  (PAXIL ) 40 MG tablet Take 1 tablet (40 mg total) by mouth every morning. for anxiety and depression.   rosuvastatin  (CRESTOR ) 10 MG tablet TAKE ONE TABLET BY MOUTH ONCE A DAY FOR CHOLESTEROL   tiZANidine (ZANAFLEX) 2 MG tablet Take 2 mg by mouth 2 (two) times daily.   benzonatate  (TESSALON ) 100 MG capsule Take 1 capsule (100 mg total) by mouth 3 (three) times daily as needed for cough.   chlorpheniramine-HYDROcodone  (TUSSIONEX) 10-8 MG/5ML Take 5 mLs by mouth every 12 (twelve) hours as needed for cough.   No facility-administered encounter medications on file as of 02/12/2024.    Allergies (verified) Septra [sulfamethoxazole-trimethoprim]   History: Past Medical History:  Diagnosis Date   Allergy    Seasonal   Anxiety    Arthritis    neck, wrists,hands, toes, and feet   COVID-19 09/08/2021   Depression    Eustachian tube dysfunction    Family history of adverse reaction to anesthesia    brother - PONV and combative   Genital warts    In past   GERD (gastroesophageal reflux disease)    History of shingles 04/16/2015   March 2016    history of Tumor of parotid gland    Hyperlipidemia    Meniere's disease    slight hearing loss, car sickness and dizziness   Migraines    sinus/stress   Monoclonal gammopathy    Polyarthralgia    PONV (postoperative nausea and vomiting)    and headaches and combative   Pre-diabetes    RLS (restless legs syndrome)    Seizures (HCC)    Dx 16 yrs ago - hormonal - none at least 3 yrs   Seizures (HCC)     H/O due to hormone issues   Past Surgical History:  Procedure Laterality Date   ABDOMINAL HYSTERECTOMY     CATARACT EXTRACTION Bilateral 01/2023   CHOLECYSTECTOMY  09/05/2006   dialation and cartarage  1982, 1999, 2000   DISTAL INTERPHALANGEAL JOINT FUSION Left 05/24/2022   Procedure: Left index and ring DIP fusion;  Surgeon: Molli Angelucci, MD;  Location: [facility];  Service: Orthopedics;  Laterality: Left;   FINGER SURGERY  2011, 2012   Bone fusion of middle finger right and left hands   FINGER SURGERY  09/06/2011   Pin removed from left middle finger   GANGLION CYST EXCISION  2011, 2012   Left and right hands   MYRINGOTOMY WITH TUBE PLACEMENT Bilateral 05/13/2015   Procedure: MYRINGOTOMY WITH  BUTTERFLY TUBE PLACEMENT;  Surgeon: Rogers Clayman, MD;  Location: [facility];  Service: ENT;  Laterality: Bilateral;  MYRINGOTOMY WITH TUBE PLACEMENT Bilateral 08/28/2019   Procedure: MYRINGOTOMY WITH BUTTERFLY TUBE PLACEMENT;  Surgeon: Rogers Clayman, MD;  Location: [facility];  Service: ENT;  Laterality: Bilateral;   PAROTID GLAND TUMOR EXCISION  09/05/1984   TONSILLECTOMY AND ADENOIDECTOMY  09/05/1957   TUBAL LIGATION  09/05/2008   Family History  Problem Relation Age of Onset   Arthritis Mother    Hyperlipidemia Mother    Hypertension Mother    Stroke Mother    Heart disease Mother    Diabetes Mother    Hyperlipidemia Father    Heart disease Father    Arthritis Brother    Throat cancer Maternal Grandfather    Breast cancer Neg Hx    Social History   Socioeconomic History   Marital status: Married    Spouse name: Not on file   Number of children: Not on file   Years of education: Not on file   Highest education level: Associate degree: occupational, Scientist, product/process development, or vocational program  Occupational History   Not on file  Tobacco Use   Smoking status: Never   Smokeless tobacco: Never  Vaping Use   Vaping status: Never Used  Substance and  Sexual Activity   Alcohol use: No    Alcohol/week: 0.0 standard drinks of alcohol   Drug use: No   Sexual activity: Yes    Partners: Male    Comment: Husband  Other Topics Concern   Not on file  Social History Narrative   Work at the [employer] at [employer].  Lives with husband with [place]. Children- daughter and son;no smoking or alcohol.      -------------------------------------------------------------------------------    Grandchildren- 6    Caffeine- 1 cup in the morning, soda occasionally, green tea hot/cold   Enjoys reading, spending time at beach, spending time with family.   Social Drivers of Corporate investment banker Strain: Low Risk  (02/12/2024)   Overall Financial Resource Strain (CARDIA)    Difficulty of Paying Living Expenses: Not hard at all  Food Insecurity: No Food Insecurity (02/12/2024)   Hunger Vital Sign    Worried About Running Out of Food in the Last Year: Never true    Ran Out of Food in the Last Year: Never true  Transportation Needs: No Transportation Needs (02/12/2024)   PRAPARE - Administrator, Civil Service (Medical): No    Lack of Transportation (Non-Medical): No  Physical Activity: Sufficiently Active (02/12/2024)   Exercise Vital Sign    Days of Exercise per Week: 7 days    Minutes of Exercise per Session: 40 min  Stress: No Stress Concern Present (02/12/2024)   [employer] of Occupational Health - Occupational Stress Questionnaire    Feeling of Stress : Not at all  Social Connections: Moderately Isolated (02/12/2024)   Social Connection and Isolation Panel [NHANES]    Frequency of Communication with Friends and Family: Three times a week    Frequency of Social Gatherings with Friends and Family: Twice a week    Attends Religious Services: Never    Database administrator or Organizations: No    Attends Engineer, structural: Never    Marital Status: Married    Tobacco Counseling Counseling given: Not  Answered    Clinical Intake:  Pre-visit preparation completed: Yes  Pain : No/denies pain     BMI - recorded: 25.58 Nutritional Status: BMI 25 -29 Overweight Nutritional Risks: None Diabetes: No  Lab Results  Component Value Date  HGBA1C 5.9 04/04/2023   HGBA1C 6.1 03/30/2022   HGBA1C 6.0 03/09/2021     How often do you need to have someone help you when you read instructions, pamphlets, or other written materials from your doctor or pharmacy?: 1 - Never What is the last grade level you completed in school?: Some college  Interpreter Needed?: No  Information entered by :: Juliann Ochoa   Activities of Daily Living     02/08/2024   10:19 AM  In your present state of health, do you have any difficulty performing the following activities:  Hearing? 1  Vision? 0  Difficulty concentrating or making decisions? 0  Walking or climbing stairs? 0  Dressing or bathing? 0  Doing errands, shopping? 0  Preparing Food and eating ? N  Using the Toilet? N  In the past six months, have you accidently leaked urine? Y  Do you have problems with loss of bowel control? Y  Managing your Medications? N  Managing your Finances? N  Housekeeping or managing your Housekeeping? N    Patient Care Team: Gabriel John, NP as PCP - General (Internal Medicine) Gwyn Leos, MD as Consulting Physician (Oncology)  I have updated your Care Teams any recent Medical Services you may have received from other providers in the past year.     Assessment:    This is a routine wellness examination for Albertia.  Hearing/Vision screen Hearing Screening - Comments:: Patient has some difficulties but hearing aids  Vision Screening - Comments:: Patient wears glasses    Goals Addressed             This Visit's Progress    Patient Stated       Patient would like to try to remove stuff from her house        Depression Screen     02/12/2024    1:23 PM 10/16/2023    4:02  PM 04/04/2023   10:37 AM 02/27/2023   11:40 AM 02/08/2023    1:36 PM 03/30/2022   11:44 AM 02/14/2022    1:54 PM  PHQ 2/9 Scores  PHQ - 2 Score 2 1 0 0 0 0 0  PHQ- 9 Score 3 3  0  0     Fall Risk     02/08/2024   10:19 AM 10/16/2023    3:52 PM 04/04/2023   10:36 AM 02/27/2023   11:40 AM 02/08/2023    1:38 PM  Fall Risk   Falls in the past year? 0 0 0 0 0  Number falls in past yr: 0 0 0 0 0  Injury with Fall? 0 0 0 0 0  Risk for fall due to : No Fall Risks No Fall Risks No Fall Risks No Fall Risks No Fall Risks  Follow up Falls evaluation completed;Falls prevention discussed Falls evaluation completed Falls evaluation completed Falls evaluation completed Falls prevention discussed;Falls evaluation completed    MEDICARE RISK AT HOME:  Medicare Risk at Home Any stairs in or around the home?: (Patient-Rptd) No If so, are there any without handrails?: (Patient-Rptd) No Home free of loose throw rugs in walkways, pet beds, electrical cords, etc?: (Patient-Rptd) Yes Adequate lighting in your home to reduce risk of falls?: (Patient-Rptd) Yes Life alert?: (Patient-Rptd) No Use of a cane, walker or w/c?: (Patient-Rptd) No Grab bars in the bathroom?: (Patient-Rptd) Yes Shower chair or bench in shower?: (Patient-Rptd) Yes Elevated toilet seat or a handicapped toilet?: No  TIMED UP AND GO:  Was  the test performed?  no  Cognitive Function: 6CIT completed        02/12/2024    1:20 PM 02/08/2023    1:40 PM 02/17/2022   10:26 AM 02/14/2022    2:00 PM  6CIT Screen  What Year? 0 points 0 points 0 points 0 points  What month? 0 points 0 points 0 points 0 points  What time? 0 points 0 points 0 points 0 points  Count back from 20 0 points 0 points 0 points 0 points  Months in reverse 0 points 0 points 0 points 0 points  Repeat phrase 0 points 0 points 0 points 0 points  Total Score 0 points 0 points 0 points 0 points    Immunizations Immunization History  Administered Date(s) Administered    Influenza,inj,Quad PF,6+ Mos 05/28/2019   Moderna Sars-Covid-2 Vaccination 09/13/2019, 10/04/2019   Pneumococcal Conjugate-13 03/31/2020   Pneumococcal Polysaccharide-23 02/26/2019   Td 02/17/2017   Zoster, Live 10/26/2015    Screening Tests Health Maintenance  Topic Date Due   Zoster Vaccines- Shingrix (1 of 2) 07/15/1972   INFLUENZA VACCINE  04/05/2024   MAMMOGRAM  05/13/2024   Medicare Annual Wellness (AWV)  02/11/2025   DTaP/Tdap/Td (2 - Tdap) 02/18/2027   Colonoscopy  11/01/2033   Pneumonia Vaccine 1+ Years old  Completed   DEXA SCAN  Completed   Hepatitis C Screening  Completed   HPV VACCINES  Aged Out   Meningococcal B Vaccine  Aged Out   COVID-19 Vaccine  Discontinued    Health Maintenance  Health Maintenance Due  Topic Date Due   Zoster Vaccines- Shingrix (1 of 2) 07/15/1972   Health Maintenance Items Addressed: Declined shingles  Additional Screening:  Vision Screening: Recommended annual ophthalmology exams for early detection of glaucoma and other disorders of the eye. Would you like a referral to an eye doctor? No    Dental Screening: Recommended annual dental exams for proper oral hygiene  Community Resource Referral / Chronic Care Management: CRR required this visit?  No   CCM required this visit?  No   Plan:    I have personally reviewed and noted the following in the patient's chart:   Medical and social history Use of alcohol, tobacco or illicit drugs  Current medications and supplements including opioid prescriptions. Patient is not currently taking opioid prescriptions. Functional ability and status Nutritional status Physical activity Advanced directives List of other physicians Hospitalizations, surgeries, and ER visits in previous 12 months Vitals Screenings to include cognitive, depression, and falls Referrals and appointments  In addition, I have reviewed and discussed with patient certain preventive protocols, quality  metrics, and best practice recommendations. A written personalized care plan for preventive services as well as general preventive health recommendations were provided to patient.   Freeda Jerry, [place]   02/12/2024   After Visit Summary: (MyChart) Due to this being a telephonic visit, the after visit summary with patients personalized plan was offered to patient via MyChart   Notes: Nothing significant to report at this time.

## 2024-02-14 ENCOUNTER — Other Ambulatory Visit: Payer: Self-pay | Admitting: Primary Care

## 2024-02-14 DIAGNOSIS — E785 Hyperlipidemia, unspecified: Secondary | ICD-10-CM

## 2024-02-19 ENCOUNTER — Encounter: Payer: Self-pay | Admitting: Internal Medicine

## 2024-02-19 ENCOUNTER — Inpatient Hospital Stay: Payer: Self-pay | Admitting: Internal Medicine

## 2024-02-19 VITALS — BP 124/76 | HR 78 | Temp 97.5°F | Resp 18 | Wt 158.0 lb

## 2024-02-19 DIAGNOSIS — Z8616 Personal history of COVID-19: Secondary | ICD-10-CM | POA: Diagnosis not present

## 2024-02-19 DIAGNOSIS — E785 Hyperlipidemia, unspecified: Secondary | ICD-10-CM | POA: Diagnosis not present

## 2024-02-19 DIAGNOSIS — G2581 Restless legs syndrome: Secondary | ICD-10-CM | POA: Diagnosis not present

## 2024-02-19 DIAGNOSIS — M549 Dorsalgia, unspecified: Secondary | ICD-10-CM | POA: Diagnosis not present

## 2024-02-19 DIAGNOSIS — D472 Monoclonal gammopathy: Secondary | ICD-10-CM

## 2024-02-19 DIAGNOSIS — E538 Deficiency of other specified B group vitamins: Secondary | ICD-10-CM | POA: Diagnosis not present

## 2024-02-19 DIAGNOSIS — Z800 Family history of malignant neoplasm of digestive organs: Secondary | ICD-10-CM | POA: Diagnosis not present

## 2024-02-19 DIAGNOSIS — M1990 Unspecified osteoarthritis, unspecified site: Secondary | ICD-10-CM | POA: Diagnosis not present

## 2024-02-19 DIAGNOSIS — M25559 Pain in unspecified hip: Secondary | ICD-10-CM | POA: Diagnosis not present

## 2024-02-19 NOTE — Progress Notes (Signed)
 Patient has been having more hip pain in both for about 4 months now rates at a 6 in her back.

## 2024-02-19 NOTE — Assessment & Plan Note (Addendum)
# [  April-MAY 2023-]-IgM lambda 0.3 g/dL- MAY 5784-6.9 gm/dl ; K/L-= WNL.   CBC CMP- WNL.   Over all stable- #I again reviewed with the patient the natural history of MGUS; small risk of progression to multiple myeloma/lymphomas etc. Patient is less likely at this time patient has any active myeloma/lymphoma given lack of any clinically concerns at this time.  We will hold off any further work-up including bone marrow biopsy or any imaging at this time.  Check MAG antibodies/next visit.   # Mild B12 def- 340 [may 2025]- recommend B12 OTC.   # Chronic Arthritis-? Osteoarthritis - Dr.Patel/[facility]. On celebrex ; recommend tramadol  prn; follow up appt with Rheum on June 20th.  Bil hip pain- defer to ortho  # Restless leg- on gabapentin - stable. Check anti-Mag antibodies.   Ordered anti-mag # DISPOSITION: # follow up in 12  months-  MD;  2 week PRIOR-  labs-cbc/cmp; LDH;iron studies; ferritin; MM panel; K/L light chains- Dr.B

## 2024-02-19 NOTE — Progress Notes (Signed)
 [facility] CONSULT NOTE  Patient Care Team: Gabriel John, NP as PCP - General (Internal Medicine) Gwyn Leos, MD as Consulting Physician (Oncology)  CHIEF COMPLAINTS/PURPOSE OF CONSULTATION: Monoclonal gammopathy  HEMATOLOGY HISTORY  MARCH 2023- IgM 03 gm/dl ]Rheumatology; KC[Immunofixation shows IgM monoclonal protein with lambda light chain  specificity; MAY 2023- IgM Lamda- 0.4 gm/dl; K/L=Noral. [id number]; creatinine- 0.58  #  rheumatology- joint pains  HISTORY OF PRESENTING ILLNESS: Alone. Independent ambulating.  April Nguyen 73 y.o.  female is here to review the results of her work-up ordered for monoclonal gammopathy/and follow up.   Patient has been having more hip pain in both for about 4 months now rates at a 6 in her back.   Patient denies any new symptoms.  Chronic mild fatigue.   Review of Systems  Constitutional:  Positive for malaise/fatigue. Negative for chills, diaphoresis, fever and weight loss.  HENT:  Negative for nosebleeds and sore throat.   Eyes:  Negative for double vision.  Respiratory:  Negative for cough, hemoptysis, sputum production, shortness of breath and wheezing.   Cardiovascular:  Negative for chest pain, palpitations, orthopnea and leg swelling.  Gastrointestinal:  Negative for abdominal pain, blood in stool, constipation, diarrhea, heartburn, melena, nausea and vomiting.  Genitourinary:  Negative for dysuria, frequency and urgency.  Musculoskeletal:  Positive for back pain and joint pain.  Skin: Negative.  Negative for itching and rash.  Neurological:  Negative for dizziness, tingling, focal weakness, weakness and headaches.  Endo/Heme/Allergies:  Does not bruise/bleed easily.  Psychiatric/Behavioral:  Negative for depression. The patient is not nervous/anxious and does not have insomnia.     MEDICAL HISTORY:  Past Medical History:  Diagnosis Date   Allergy    Seasonal   Anxiety    Arthritis    neck,  wrists,hands, toes, and feet   COVID-19 09/08/2021   Depression    Eustachian tube dysfunction    Family history of adverse reaction to anesthesia    brother - PONV and combative   Genital warts    In past   GERD (gastroesophageal reflux disease)    History of shingles 04/16/2015   March 2016    history of Tumor of parotid gland    Hyperlipidemia    Meniere's disease    slight hearing loss, car sickness and dizziness   Migraines    sinus/stress   Monoclonal gammopathy    Polyarthralgia    PONV (postoperative nausea and vomiting)    and headaches and combative   Pre-diabetes    RLS (restless legs syndrome)    Seizures (HCC)    Dx 16 yrs ago - hormonal - none at least 3 yrs   Seizures (HCC)    H/O due to hormone issues    SURGICAL HISTORY: Past Surgical History:  Procedure Laterality Date   ABDOMINAL HYSTERECTOMY     CATARACT EXTRACTION Bilateral 01/2023   CHOLECYSTECTOMY  09/05/2006   dialation and cartarage  1982, 1999, 2000   DISTAL INTERPHALANGEAL JOINT FUSION Left 05/24/2022   Procedure: Left index and ring DIP fusion;  Surgeon: Molli Angelucci, MD;  Location: [facility];  Service: Orthopedics;  Laterality: Left;   FINGER SURGERY  2011, 2012   Bone fusion of middle finger right and left hands   FINGER SURGERY  09/06/2011   Pin removed from left middle finger   GANGLION CYST EXCISION  2011, 2012   Left and right hands   MYRINGOTOMY WITH TUBE PLACEMENT Bilateral 05/13/2015  Procedure: MYRINGOTOMY WITH  BUTTERFLY TUBE PLACEMENT;  Surgeon: Rogers Clayman, MD;  Location: [facility];  Service: ENT;  Laterality: Bilateral;   MYRINGOTOMY WITH TUBE PLACEMENT Bilateral 08/28/2019   Procedure: MYRINGOTOMY WITH BUTTERFLY TUBE PLACEMENT;  Surgeon: Rogers Clayman, MD;  Location: [facility];  Service: ENT;  Laterality: Bilateral;   PAROTID GLAND TUMOR EXCISION  09/05/1984   TONSILLECTOMY AND ADENOIDECTOMY  09/05/1957   TUBAL LIGATION  09/05/2008     SOCIAL HISTORY: Social History   Socioeconomic History   Marital status: Married    Spouse name: Not on file   Number of children: Not on file   Years of education: Not on file   Highest education level: Associate degree: occupational, Scientist, product/process development, or vocational program  Occupational History   Not on file  Tobacco Use   Smoking status: Never   Smokeless tobacco: Never  Vaping Use   Vaping status: Never Used  Substance and Sexual Activity   Alcohol use: No    Alcohol/week: 0.0 standard drinks of alcohol   Drug use: No   Sexual activity: Yes    Partners: Male    Comment: Husband  Other Topics Concern   Not on file  Social History Narrative   Work at the [employer] at [employer].  Lives with husband with . Children- daughter and son;no smoking or alcohol.      -------------------------------------------------------------------------------    Grandchildren- 6    Caffeine- 1 cup in the morning, soda occasionally, green tea hot/cold   Enjoys reading, spending time at beach, spending time with family.   Social Drivers of Corporate investment banker Strain: Low Risk  (02/12/2024)   Overall Financial Resource Strain (CARDIA)    Difficulty of Paying Living Expenses: Not hard at all  Food Insecurity: No Food Insecurity (02/12/2024)   Hunger Vital Sign    Worried About Running Out of Food in the Last Year: Never true    Ran Out of Food in the Last Year: Never true  Transportation Needs: No Transportation Needs (02/12/2024)   PRAPARE - Administrator, Civil Service (Medical): No    Lack of Transportation (Non-Medical): No  Physical Activity: Sufficiently Active (02/12/2024)   Exercise Vital Sign    Days of Exercise per Week: 7 days    Minutes of Exercise per Session: 40 min  Stress: No Stress Concern Present (02/12/2024)   [employer] of Occupational Health - Occupational Stress Questionnaire    Feeling of Stress : Not at all  Social  Connections: Moderately Isolated (02/12/2024)   Social Connection and Isolation Panel    Frequency of Communication with Friends and Family: Three times a week    Frequency of Social Gatherings with Friends and Family: Twice a week    Attends Religious Services: Never    Database administrator or Organizations: No    Attends Banker Meetings: Never    Marital Status: Married  Catering manager Violence: Not At Risk (02/12/2024)   Humiliation, Afraid, Rape, and Kick questionnaire    Fear of Current or Ex-Partner: No    Emotionally Abused: No    Physically Abused: No    Sexually Abused: No    FAMILY HISTORY: Family History  Problem Relation Age of Onset   Arthritis Mother    Hyperlipidemia Mother    Hypertension Mother    Stroke Mother    Heart disease Mother    Diabetes Mother    Hyperlipidemia Father  Heart disease Father    Arthritis Brother    Throat cancer Maternal Grandfather    Breast cancer Neg Hx     ALLERGIES:  is allergic to septra [sulfamethoxazole-trimethoprim].  MEDICATIONS:  Current Outpatient Medications  Medication Sig Dispense Refill   Ascorbic Acid (VITAMIN C) 1000 MG tablet Take 1,800 mg by mouth daily.     benzonatate  (TESSALON ) 100 MG capsule Take 1 capsule (100 mg total) by mouth 3 (three) times daily as needed for cough. 21 capsule 0   celecoxib  (CELEBREX ) 100 MG capsule Take 100 mg by mouth 2 (two) times daily. 100 mg AM, 200 mg at dinner     celecoxib  (CELEBREX ) 100 MG capsule Take up to 2 capsules (200 mg total) by mouth 2 (two) times daily as needed for pain. 360 capsule 1   cetirizine  (ZYRTEC ) 10 MG tablet Take 1 tablet (10 mg total) by mouth daily. (Patient taking differently: Take 10 mg by mouth daily. am) 30 tablet 11   chlorpheniramine-HYDROcodone  (TUSSIONEX) 10-8 MG/5ML Take 5 mLs by mouth every 12 (twelve) hours as needed for cough. 70 mL 0   Ergocalciferol  (VITAMIN D2) 10 MCG (400 UNIT) TABS Take 800 Units by mouth daily.      estradiol  (ESTRACE ) 0.1 MG/GM vaginal cream APPLY TWO TO THREE TIMES WEEKLY 42.5 g 1   famotidine  (PEPCID ) 20 MG tablet TAKE ONE TABLET (20 MG TOTAL) BY MOUTH DAILY. FOR HEARTBURN. 90 tablet 0   fluticasone  (FLONASE ) 50 MCG/ACT nasal spray PLACE ONE SPRAY INTO BOTH NOSTRILS TWO TIMES DAILY AS NEEDED FOR ALLERGIES OR RHINITIS. 48 mL 0   gabapentin  (NEURONTIN ) 600 MG tablet Take 1 tablet (600 mg total) by mouth at bedtime. For restless legs 90 tablet 3   hydrOXYzine  (ATARAX /VISTARIL ) 10 MG tablet Take 1-2 tablets by mouth twice daily as needed for long car rides. 30 tablet 0   meclizine  (ANTIVERT ) 25 MG tablet Take 1 tablet (25 mg total) by mouth 3 (three) times daily as needed for dizziness (take as needed not with other antihistimines). 30 tablet 0   omeprazole  (PRILOSEC) 20 MG capsule TAKE ONE CAPSULE BY MOUTH ONCE DAILY FOR HEARTBURN 90 capsule 2   PARoxetine  (PAXIL ) 40 MG tablet Take 1 tablet (40 mg total) by mouth every morning. for anxiety and depression. 90 tablet 3   rosuvastatin  (CRESTOR ) 10 MG tablet TAKE ONE TABLET BY MOUTH ONCE A DAY FOR CHOLESTEROL 90 tablet 0   tiZANidine (ZANAFLEX) 2 MG tablet Take 2 mg by mouth 2 (two) times daily.     No current facility-administered medications for this visit.      PHYSICAL EXAMINATION:   Vitals:   02/19/24 1044  BP: 124/76  Pulse: 78  Resp: 18  Temp: (!) 97.5 F (36.4 C)  SpO2: 100%   Filed Weights   02/19/24 1044  Weight: 158 lb (71.7 kg)    Physical Exam Vitals and nursing note reviewed.  HENT:     Head: Normocephalic and atraumatic.     Mouth/Throat:     Pharynx: Oropharynx is clear.   Eyes:     Extraocular Movements: Extraocular movements intact.     Pupils: Pupils are equal, round, and reactive to light.    Cardiovascular:     Rate and Rhythm: Normal rate and regular rhythm.  Pulmonary:     Comments: Decreased breath sounds bilaterally.  Abdominal:     Palpations: Abdomen is soft.   Musculoskeletal:         General: Normal range of  motion.     Cervical back: Normal range of motion.   Skin:    General: Skin is warm.   Neurological:     General: No focal deficit present.     Mental Status: She is alert and oriented to person, place, and time.   Psychiatric:        Behavior: Behavior normal.        Judgment: Judgment normal.     LABORATORY DATA:  I have reviewed the data as listed Lab Results  Component Value Date   WBC 4.0 02/05/2024   HGB 12.3 02/05/2024   HCT 37.2 02/05/2024   MCV 97.4 02/05/2024   PLT 320 02/05/2024   Recent Labs    02/05/24 0955  NA 134*  K 4.5  CL 100  CO2 25  GLUCOSE 89  BUN 22  CREATININE 0.49  CALCIUM  8.7*  GFRNONAA >60  PROT 7.2  ALBUMIN 3.9  AST 19  ALT 15  ALKPHOS 58  BILITOT 0.5     No results found.  Lab Results  Component Value Date   KPAFRELGTCHN 18.2 02/05/2024   KPAFRELGTCHN 16.7 01/23/2023   KPAFRELGTCHN 20.6 (H) 07/15/2022   LAMBDASER 18.8 02/05/2024   LAMBDASER 19.6 01/23/2023   LAMBDASER 16.7 07/15/2022   KAPLAMBRATIO 0.97 02/05/2024   KAPLAMBRATIO 0.85 01/23/2023   KAPLAMBRATIO 1.23 07/15/2022     Monoclonal gammopathy # [April-MAY 2023-]-IgM lambda 0.3 g/dL- MAY 4098-1.1 gm/dl ; K/L-= WNL.   CBC CMP- WNL.   Over all stable- #I again reviewed with the patient the natural history of MGUS; small risk of progression to multiple myeloma/lymphomas etc. Patient is less likely at this time patient has any active myeloma/lymphoma given lack of any clinically concerns at this time.  We will hold off any further work-up including bone marrow biopsy or any imaging at this time.  Check MAG antibodies/next visit.   # Mild B12 def- 340 [may 2025]- recommend B12 OTC.   # Chronic Arthritis-? Osteoarthritis - Dr.Patel/[facility]. On celebrex ; recommend tramadol  prn; follow up appt with Rheum on June 20th.  Bil hip pain- defer to ortho  # Restless leg- on gabapentin - stable. Check anti-Mag antibodies.   Ordered  anti-mag # DISPOSITION: # follow up in 12  months-  MD;  2 week PRIOR-  labs-cbc/cmp; LDH;iron studies; ferritin; MM panel; K/L light chains- Dr.B  All questions were answered. The patient knows to call the clinic with any problems, questions or concerns.   Gwyn Leos, MD 02/19/2024 11:13 AM

## 2024-03-06 ENCOUNTER — Other Ambulatory Visit: Payer: Self-pay

## 2024-03-07 ENCOUNTER — Other Ambulatory Visit: Payer: Self-pay | Admitting: Primary Care

## 2024-03-07 DIAGNOSIS — K219 Gastro-esophageal reflux disease without esophagitis: Secondary | ICD-10-CM

## 2024-03-18 ENCOUNTER — Other Ambulatory Visit: Payer: Self-pay | Admitting: Primary Care

## 2024-03-18 DIAGNOSIS — K219 Gastro-esophageal reflux disease without esophagitis: Secondary | ICD-10-CM

## 2024-03-18 DIAGNOSIS — G2581 Restless legs syndrome: Secondary | ICD-10-CM

## 2024-03-28 ENCOUNTER — Other Ambulatory Visit: Payer: Self-pay | Admitting: Primary Care

## 2024-03-28 DIAGNOSIS — F418 Other specified anxiety disorders: Secondary | ICD-10-CM

## 2024-04-05 ENCOUNTER — Ambulatory Visit: Admitting: Primary Care

## 2024-04-05 ENCOUNTER — Encounter: Payer: Self-pay | Admitting: Primary Care

## 2024-04-05 VITALS — BP 136/68 | HR 87 | Temp 97.3°F | Ht 64.0 in | Wt 150.0 lb

## 2024-04-05 DIAGNOSIS — G2581 Restless legs syndrome: Secondary | ICD-10-CM

## 2024-04-05 DIAGNOSIS — Z0001 Encounter for general adult medical examination with abnormal findings: Secondary | ICD-10-CM | POA: Diagnosis not present

## 2024-04-05 DIAGNOSIS — D472 Monoclonal gammopathy: Secondary | ICD-10-CM

## 2024-04-05 DIAGNOSIS — F418 Other specified anxiety disorders: Secondary | ICD-10-CM | POA: Diagnosis not present

## 2024-04-05 DIAGNOSIS — K219 Gastro-esophageal reflux disease without esophagitis: Secondary | ICD-10-CM

## 2024-04-05 DIAGNOSIS — E2839 Other primary ovarian failure: Secondary | ICD-10-CM | POA: Diagnosis not present

## 2024-04-05 DIAGNOSIS — E785 Hyperlipidemia, unspecified: Secondary | ICD-10-CM | POA: Diagnosis not present

## 2024-04-05 DIAGNOSIS — H8109 Meniere's disease, unspecified ear: Secondary | ICD-10-CM | POA: Diagnosis not present

## 2024-04-05 DIAGNOSIS — G8929 Other chronic pain: Secondary | ICD-10-CM

## 2024-04-05 DIAGNOSIS — G4700 Insomnia, unspecified: Secondary | ICD-10-CM

## 2024-04-05 DIAGNOSIS — R7303 Prediabetes: Secondary | ICD-10-CM

## 2024-04-05 DIAGNOSIS — M542 Cervicalgia: Secondary | ICD-10-CM

## 2024-04-05 DIAGNOSIS — Z0000 Encounter for general adult medical examination without abnormal findings: Secondary | ICD-10-CM

## 2024-04-05 DIAGNOSIS — Z1231 Encounter for screening mammogram for malignant neoplasm of breast: Secondary | ICD-10-CM

## 2024-04-05 DIAGNOSIS — Z79890 Hormone replacement therapy (postmenopausal): Secondary | ICD-10-CM

## 2024-04-05 DIAGNOSIS — M1990 Unspecified osteoarthritis, unspecified site: Secondary | ICD-10-CM

## 2024-04-05 LAB — HEMOGLOBIN A1C: Hgb A1c MFr Bld: 6.1 % (ref 4.6–6.5)

## 2024-04-05 LAB — LIPID PANEL
Cholesterol: 160 mg/dL (ref 0–200)
HDL: 60.3 mg/dL (ref >39.00)
LDL Cholesterol: 80 mg/dL (ref 0–99)
NonHDL: 99.43
Total CHOL/HDL Ratio: 3
Triglycerides: 98 mg/dL (ref 0.0–149.0)
VLDL: 19.6 mg/dL (ref 0.0–40.0)

## 2024-04-05 MED ORDER — HYDROCHLOROTHIAZIDE 12.5 MG PO TABS: 12.5000 mg | ORAL_TABLET | Freq: Every day | ORAL | 3 refills | Status: AC

## 2024-04-05 MED ORDER — DULOXETINE HCL 30 MG PO CPEP: 30.0000 mg | ORAL_CAPSULE | Freq: Every day | ORAL | 0 refills | Status: DC

## 2024-04-05 NOTE — Assessment & Plan Note (Signed)
Controlled.  Continue gabapentin 600 mg at bedtime.

## 2024-04-05 NOTE — Assessment & Plan Note (Signed)
 Controlled.  Continue famotidine  20 mg daily and omeprazole  20 mg daily.

## 2024-04-05 NOTE — Assessment & Plan Note (Signed)
Controlled.  Continue Estrace cream 2-3 times weekly

## 2024-04-05 NOTE — Assessment & Plan Note (Signed)
 Waxes and wanes.  No longer on hydrochlorothiazide  25 mg due to low BP readings. Hydrochlorothiazide  was effective. Will trial 12.5 mg dose. She will update.

## 2024-04-05 NOTE — Assessment & Plan Note (Signed)
 Repeat A1C pending,

## 2024-04-05 NOTE — Assessment & Plan Note (Signed)
 Repeat lipid panel pending.  Will hold rosuvastatin  x 2 weeks given joint aches.  She will update.

## 2024-04-05 NOTE — Progress Notes (Signed)
 Subjective:    Patient ID: April Nguyen, female    DOB: 27-Aug-1951, 73 y.o.   MRN: [id number]  HPI  April Nguyen is a very pleasant 73 y.o. female who presents today for complete physical and follow up of chronic conditions.  She would also like to discuss her chronic joint pain. Following with rheumatology for ongoing joint pain to lower back, fingers, knees, toes, shoulders. She is managed on celebrex  200 mg BID, gabapentin  600 mg HS. Her rheumatologist would like her to try Cymbalta for pain.   She is managed on paroxetine  for hot flashes for which do not help. She's also experienced increased anxiety and depression. Her husband has been diagnosed with early onset frontal lobe dementia which has been difficult.   Immunizations: -Tetanus: Completed in 2018  -Shingles: Completed Zostavax -Pneumonia: Completed Prevnar 13 in 2021, Pneumovax 23 2020  Diet: Fair diet.  Exercise: No regular exercise.  Eye exam: Completes annually, due  Dental exam: Completes semi-annually     Mammogram: Completed in September 2023 Bone Density Scan: Completed in September 2023  Colonoscopy: Completed in 2025, due 2035 if applicable.   BP Readings from Last 3 Encounters:  04/05/24 136/68  02/19/24 124/76  02/12/24 124/76       Review of Systems  Constitutional:  Negative for unexpected weight change.  HENT:  Negative for rhinorrhea.   Respiratory:  Negative for cough and shortness of breath.   Cardiovascular:  Negative for chest pain.  Gastrointestinal:  Negative for constipation and diarrhea.  Genitourinary:  Negative for difficulty urinating.  Musculoskeletal:  Positive for arthralgias and myalgias.  Skin:  Negative for rash.  Allergic/Immunologic: Negative for environmental allergies.  Neurological:  Negative for dizziness and headaches.  Psychiatric/Behavioral:  The patient is nervous/anxious.          Past Medical History:  Diagnosis Date   Allergy    Seasonal    Anxiety    Arthritis    neck, wrists,hands, toes, and feet   COVID-19 09/08/2021   Depression    Eustachian tube dysfunction    Family history of adverse reaction to anesthesia    brother - PONV and combative   Genital warts    In past   GERD (gastroesophageal reflux disease)    History of shingles 04/16/2015   March 2016    history of Tumor of parotid gland    Hyperlipidemia    Meniere's disease    slight hearing loss, car sickness and dizziness   Migraines    sinus/stress   Monoclonal gammopathy    Polyarthralgia    PONV (postoperative nausea and vomiting)    and headaches and combative   Pre-diabetes    RLS (restless legs syndrome)    Seizures (HCC)    Dx 16 yrs ago - hormonal - none at least 3 yrs   Seizures (HCC)    H/O due to hormone issues    Social History   Socioeconomic History   Marital status: Married    Spouse name: Not on file   Number of children: Not on file   Years of education: Not on file   Highest education level: Associate degree: occupational, Scientist, product/process development, or vocational program  Occupational History   Not on file  Tobacco Use   Smoking status: Never   Smokeless tobacco: Never  Vaping Use   Vaping status: Never Used  Substance and Sexual Activity   Alcohol use: No    Alcohol/week: 0.0 standard drinks of alcohol  Drug use: No   Sexual activity: Yes    Partners: Male    Comment: Husband  Other Topics Concern   Not on file  Social History Narrative   Work at the [employer] at [employer].  Lives with husband with [place]. Children- daughter and son;no smoking or alcohol.      -------------------------------------------------------------------------------    Grandchildren- 6    Caffeine- 1 cup in the morning, soda occasionally, green tea hot/cold   Enjoys reading, spending time at beach, spending time with family.   Social Drivers of Corporate investment banker Strain: Low Risk  (04/04/2024)   Overall Financial Resource  Strain (CARDIA)    Difficulty of Paying Living Expenses: Not very hard  Food Insecurity: No Food Insecurity (04/04/2024)   Hunger Vital Sign    Worried About Running Out of Food in the Last Year: Never true    Ran Out of Food in the Last Year: Never true  Transportation Needs: No Transportation Needs (04/04/2024)   PRAPARE - Administrator, Civil Service (Medical): No    Lack of Transportation (Non-Medical): No  Physical Activity: Insufficiently Active (04/04/2024)   Exercise Vital Sign    Days of Exercise per Week: 3 days    Minutes of Exercise per Session: 10 min  Stress: Stress Concern Present (04/04/2024)   [employer] of Occupational Health - Occupational Stress Questionnaire    Feeling of Stress: Rather much  Social Connections: Moderately Integrated (04/04/2024)   Social Connection and Isolation Panel    Frequency of Communication with Friends and Family: More than three times a week    Frequency of Social Gatherings with Friends and Family: More than three times a week    Attends Religious Services: 1 to 4 times per year    Active Member of [employer] or Organizations: No    Attends Banker Meetings: Not on file    Marital Status: Married  Recent Concern: Social Connections - Moderately Isolated (02/12/2024)   Social Connection and Isolation Panel    Frequency of Communication with Friends and Family: Three times a week    Frequency of Social Gatherings with Friends and Family: Twice a week    Attends Religious Services: Never    Database administrator or Organizations: No    Attends Banker Meetings: Never    Marital Status: Married  Catering manager Violence: Not At Risk (02/12/2024)   Humiliation, Afraid, Rape, and Kick questionnaire    Fear of Current or Ex-Partner: No    Emotionally Abused: No    Physically Abused: No    Sexually Abused: No    Past Surgical History:  Procedure Laterality Date   ABDOMINAL HYSTERECTOMY     CATARACT  EXTRACTION Bilateral 01/2023   CHOLECYSTECTOMY  09/05/2006   dialation and cartarage  1982, 1999, 2000   DISTAL INTERPHALANGEAL JOINT FUSION Left 05/24/2022   Procedure: Left index and ring DIP fusion;  Surgeon: Kathlynn Sharper, MD;  Location: [facility];  Service: Orthopedics;  Laterality: Left;   FINGER SURGERY  2011, 2012   Bone fusion of middle finger right and left hands   FINGER SURGERY  09/06/2011   Pin removed from left middle finger   GANGLION CYST EXCISION  2011, 2012   Left and right hands   MYRINGOTOMY WITH TUBE PLACEMENT Bilateral 05/13/2015   Procedure: MYRINGOTOMY WITH  BUTTERFLY TUBE PLACEMENT;  Surgeon: Carolee Hunter, MD;  Location: [facility];  Service: ENT;  Laterality: Bilateral;   MYRINGOTOMY WITH TUBE PLACEMENT Bilateral 08/28/2019   Procedure: MYRINGOTOMY WITH BUTTERFLY TUBE PLACEMENT;  Surgeon: Milissa Hamming, MD;  Location: [facility];  Service: ENT;  Laterality: Bilateral;   PAROTID GLAND TUMOR EXCISION  09/05/1984   TONSILLECTOMY AND ADENOIDECTOMY  09/05/1957   TUBAL LIGATION  09/05/2008    Family History  Problem Relation Age of Onset   Arthritis Mother    Hyperlipidemia Mother    Hypertension Mother    Stroke Mother    Heart disease Mother    Diabetes Mother    Hyperlipidemia Father    Heart disease Father    Arthritis Brother    Throat cancer Maternal Grandfather    Breast cancer Neg Hx     Allergies  Allergen Reactions   Septra [Sulfamethoxazole-Trimethoprim] Rash    Current Outpatient Medications on File Prior to Visit  Medication Sig Dispense Refill   Ascorbic Acid (VITAMIN C) 1000 MG tablet Take 1,800 mg by mouth daily.     celecoxib  (CELEBREX ) 100 MG capsule Take 100 mg by mouth 2 (two) times daily. 100 mg AM, 200 mg at dinner     celecoxib  (CELEBREX ) 100 MG capsule Take up to 2 capsules (200 mg total) by mouth 2 (two) times daily as needed for pain. 360 capsule 1   cetirizine  (ZYRTEC ) 10 MG tablet Take 1  tablet (10 mg total) by mouth daily. (Patient taking differently: Take 10 mg by mouth daily. am) 30 tablet 11   cyanocobalamin  (VITAMIN B12) 500 MCG tablet Take 500 mcg by mouth daily.     Ergocalciferol  (VITAMIN D2) 10 MCG (400 UNIT) TABS Take 800 Units by mouth daily.     estradiol  (ESTRACE ) 0.1 MG/GM vaginal cream APPLY TWO TO THREE TIMES WEEKLY 42.5 g 1   famotidine  (PEPCID ) 20 MG tablet TAKE ONE TABLET (20 MG TOTAL) BY MOUTH DAILY FOR HEARTBURN. 90 tablet 0   fluticasone  (FLONASE ) 50 MCG/ACT nasal spray PLACE ONE SPRAY INTO BOTH NOSTRILS TWO TIMES DAILY AS NEEDED FOR ALLERGIES OR RHINITIS. 48 mL 0   gabapentin  (NEURONTIN ) 600 MG tablet TAKE ONE TABLET (600 MG TOTAL) BY MOUTH AT BEDTIME. FOR RESTLESS LEGS 90 tablet 0   hydrOXYzine  (ATARAX /VISTARIL ) 10 MG tablet Take 1-2 tablets by mouth twice daily as needed for long car rides. 30 tablet 0   meclizine  (ANTIVERT ) 25 MG tablet Take 1 tablet (25 mg total) by mouth 3 (three) times daily as needed for dizziness (take as needed not with other antihistimines). 30 tablet 0   omeprazole  (PRILOSEC) 20 MG capsule TAKE ONE CAPSULE BY MOUTH ONCE DAILY FOR HEARTBURN 90 capsule 0   rosuvastatin  (CRESTOR ) 10 MG tablet TAKE ONE TABLET BY MOUTH ONCE A DAY FOR CHOLESTEROL 90 tablet 0   tiZANidine (ZANAFLEX) 2 MG tablet Take 2 mg by mouth 2 (two) times daily.     No current facility-administered medications on file prior to visit.    BP 136/68   Pulse 87   Temp (!) 97.3 F (36.3 C) (Temporal)   Ht 5' 4 (1.626 m)   Wt 150 lb (68 kg)   SpO2 97%   BMI 25.75 kg/m  Objective:   Physical Exam HENT:     Right Ear: Tympanic membrane and ear canal normal.     Left Ear: Tympanic membrane and ear canal normal.  Eyes:     Pupils: Pupils are equal, round, and reactive to light.  Cardiovascular:     Rate and Rhythm: Normal rate and regular  rhythm.  Pulmonary:     Effort: Pulmonary effort is normal.     Breath sounds: Normal breath sounds.  Abdominal:      General: Bowel sounds are normal.     Palpations: Abdomen is soft.     Tenderness: There is no abdominal tenderness.  Musculoskeletal:        General: Normal range of motion.     Cervical back: Neck supple.  Skin:    General: Skin is warm and dry.  Neurological:     Mental Status: She is alert and oriented to person, place, and time.     Cranial Nerves: No cranial nerve deficit.     Deep Tendon Reflexes:     Reflex Scores:      Patellar reflexes are 2+ on the right side and 2+ on the left side. Psychiatric:        Mood and Affect: Mood normal.           Assessment & Plan:  Encounter for annual general medical examination with abnormal findings in adult Assessment & Plan: Immunizations UTD. Discussed Shingrix vaccines.  Mammogram due, orders placed. Colonoscopy UTD, due 2035  Discussed the importance of a healthy diet and regular exercise in order for weight loss, and to reduce the risk of further co-morbidity.  Exam stable. Labs pending.  Follow up in 1 year for repeat physical.    Screening mammogram for breast cancer -     3D Screening Mammogram, Left and Right; Future  Estrogen deficiency -     DG Bone Density; Future  Gastroesophageal reflux disease without esophagitis Assessment & Plan: Controlled.  Continue famotidine  20 mg daily and omeprazole  20 mg daily.   Meniere's disease, unspecified laterality Assessment & Plan: Waxes and wanes.  No longer on hydrochlorothiazide  25 mg due to low BP readings. Hydrochlorothiazide  was effective. Will trial 12.5 mg dose. She will update.   Orders: -     hydroCHLOROthiazide ; Take 1 tablet (12.5 mg total) by mouth daily. For meniere's disease  Dispense: 90 tablet; Refill: 3  Restless legs Assessment & Plan: Controlled.   Continue gabapentin  600 mg HS.   Depression with anxiety Assessment & Plan: Deteriorated.  Wean off paroxetine . Start Cymbalta 30 mg daily.  Close follow-up in 1  month.    Orders: -     DULoxetine HCl; Take 1 capsule (30 mg total) by mouth daily. For anxiety, depression, pain  Dispense: 90 capsule; Refill: 0  Chronic neck pain Assessment & Plan: Following with rheumatology.  Continue Celebrex  200 mg twice daily. Will trial Cymbalta 30 mg daily.   Arthritis Assessment & Plan: Uncontrolled. Following with rheumatology..    Continue Celebrex  200 mg twice daily as needed, gabapentin  600 mg at bedtime. Add Cymbalta 30 mg daily.      Hormone replacement therapy (HRT) Assessment & Plan: Controlled.  Continue Estrace  cream 2-3 times weekly.   Hyperlipidemia, unspecified hyperlipidemia type Assessment & Plan: Repeat lipid panel pending.  Will hold rosuvastatin  x 2 weeks given joint aches.  She will update.  Orders: -     Lipid panel  Insomnia, unspecified type Assessment & Plan: Controlled.  Continue gabapentin  600 mg at bedtime.   Monoclonal gammopathy Assessment & Plan: Stable.  Following with hematology, office notes and labs reviewed from June 2025.   Prediabetes Assessment & Plan: Repeat A1C pending,  Orders: -     Hemoglobin A1c        Comer MARLA Gaskins, NP

## 2024-04-05 NOTE — Assessment & Plan Note (Signed)
 Following with rheumatology.  Continue Celebrex  200 mg twice daily. Will trial Cymbalta 30 mg daily.

## 2024-04-05 NOTE — Assessment & Plan Note (Signed)
 Deteriorated.  Wean off paroxetine . Start Cymbalta 30 mg daily.  Close follow-up in 1 month.

## 2024-04-05 NOTE — Assessment & Plan Note (Signed)
 Uncontrolled. Following with rheumatology..    Continue Celebrex  200 mg twice daily as needed, gabapentin  600 mg at bedtime. Add Cymbalta 30 mg daily.

## 2024-04-05 NOTE — Assessment & Plan Note (Signed)
Controlled.  Continue gabapentin 600 mg HS. 

## 2024-04-05 NOTE — Assessment & Plan Note (Signed)
 Stable.  Following with hematology, office notes and labs reviewed from June 2025.

## 2024-04-05 NOTE — Assessment & Plan Note (Signed)
 Immunizations UTD. Discussed Shingrix vaccines.  Mammogram due, orders placed. Colonoscopy UTD, due 2035  Discussed the importance of a healthy diet and regular exercise in order for weight loss, and to reduce the risk of further co-morbidity.  Exam stable. Labs pending.  Follow up in 1 year for repeat physical.

## 2024-04-05 NOTE — Patient Instructions (Addendum)
 Stop by the lab prior to leaving today. I will notify you of your results once received.   [facility].   Cut your paroxetine  (Paxil ) pill in half. Take 1/2 pill once daily x 2- 4 weeks, then stop.  Start Cymbalta 30 mg once daily for depression.   Resume hydrochlorothiazide  at 12.5 mg once daily for Mnire's disease.  Schedule a follow-up visit for 1 month for anxiety/depression.  It was a pleasure to see you today!

## 2024-04-07 ENCOUNTER — Ambulatory Visit: Payer: Self-pay | Admitting: Primary Care

## 2024-05-02 DIAGNOSIS — H35371 Puckering of macula, right eye: Secondary | ICD-10-CM | POA: Diagnosis not present

## 2024-05-02 DIAGNOSIS — H26493 Other secondary cataract, bilateral: Secondary | ICD-10-CM | POA: Diagnosis not present

## 2024-05-02 DIAGNOSIS — H43813 Vitreous degeneration, bilateral: Secondary | ICD-10-CM | POA: Diagnosis not present

## 2024-05-02 DIAGNOSIS — R7303 Prediabetes: Secondary | ICD-10-CM | POA: Diagnosis not present

## 2024-05-02 LAB — HM DIABETES EYE EXAM

## 2024-05-07 ENCOUNTER — Other Ambulatory Visit: Payer: Self-pay

## 2024-05-07 MED ORDER — CELECOXIB 100 MG PO CAPS
200.0000 mg | ORAL_CAPSULE | Freq: Two times a day (BID) | ORAL | 1 refills | Status: DC | PRN
  Filled 2024-05-07: qty 360, 90d supply, fill #0
  Filled 2024-08-07: qty 360, 90d supply, fill #1

## 2024-05-09 ENCOUNTER — Other Ambulatory Visit: Payer: Self-pay | Admitting: Primary Care

## 2024-05-09 DIAGNOSIS — E785 Hyperlipidemia, unspecified: Secondary | ICD-10-CM

## 2024-05-14 ENCOUNTER — Encounter: Payer: Self-pay | Admitting: Primary Care

## 2024-05-30 ENCOUNTER — Other Ambulatory Visit: Payer: Self-pay | Admitting: Primary Care

## 2024-05-30 DIAGNOSIS — K219 Gastro-esophageal reflux disease without esophagitis: Secondary | ICD-10-CM

## 2024-05-31 ENCOUNTER — Other Ambulatory Visit: Payer: Self-pay | Admitting: Primary Care

## 2024-05-31 DIAGNOSIS — N951 Menopausal and female climacteric states: Secondary | ICD-10-CM

## 2024-06-13 ENCOUNTER — Other Ambulatory Visit: Payer: Self-pay | Admitting: Primary Care

## 2024-06-13 DIAGNOSIS — K219 Gastro-esophageal reflux disease without esophagitis: Secondary | ICD-10-CM

## 2024-06-13 DIAGNOSIS — G2581 Restless legs syndrome: Secondary | ICD-10-CM

## 2024-06-20 ENCOUNTER — Other Ambulatory Visit: Payer: Self-pay | Admitting: Primary Care

## 2024-06-20 DIAGNOSIS — F418 Other specified anxiety disorders: Secondary | ICD-10-CM

## 2024-06-26 ENCOUNTER — Other Ambulatory Visit: Payer: Self-pay | Admitting: Primary Care

## 2024-06-26 DIAGNOSIS — F418 Other specified anxiety disorders: Secondary | ICD-10-CM

## 2024-07-13 ENCOUNTER — Encounter: Payer: Self-pay | Admitting: Emergency Medicine

## 2024-07-13 ENCOUNTER — Ambulatory Visit
Admission: EM | Admit: 2024-07-13 | Discharge: 2024-07-13 | Disposition: A | Discharge status: Home / Self Care | Attending: Emergency Medicine | Admitting: Emergency Medicine

## 2024-07-13 DIAGNOSIS — H6502 Acute serous otitis media, left ear: Secondary | ICD-10-CM | POA: Diagnosis not present

## 2024-07-13 MED ORDER — AMOXICILLIN-POT CLAVULANATE 875-125 MG PO TABS: 1.0000 | ORAL_TABLET | Freq: Two times a day (BID) | ORAL | 0 refills | Status: AC

## 2024-07-13 NOTE — ED Triage Notes (Signed)
 Patient in room 6. Patient reports left ear pain, left ear fullness, runny nose with clear drainage and cough x 3 days. Patient took a rapid Covid test today and results was negative. Rates pain 8/10.

## 2024-07-13 NOTE — ED Provider Notes (Signed)
 April Nguyen    CSN: [id number] Arrival date & time: 07/13/24  1130      History   Chief Complaint Chief Complaint  Patient presents with   Ear Fullness   Otalgia   Nasal Congestion   Cough    HPI April Nguyen is a 73 y.o. female.   Patient presents for evaluation of nasal congestion, sneezing, left-sided ear pain, fullness and decreased hearing present for 3 days.  Has experienced a dry hacking cough only in the morning upon awakening.  Tolerable to food and liquids.  No known sick contacts prior.  Home COVID test this morning negative.  Has attempted use of antihistamine and Flonase  with minimal relief.  History of Mnire's disease.  Denies fever, sore throat.   Past Medical History:  Diagnosis Date   Allergy    Seasonal   Anxiety    Arthritis    neck, wrists,hands, toes, and feet   COVID-19 09/08/2021   Depression    Eustachian tube dysfunction    Family history of adverse reaction to anesthesia    brother - PONV and combative   Genital warts    In past   GERD (gastroesophageal reflux disease)    History of shingles 04/16/2015   March 2016    history of Tumor of parotid gland    Hyperlipidemia    Meniere's disease    slight hearing loss, car sickness and dizziness   Migraines    sinus/stress   Monoclonal gammopathy    Polyarthralgia    PONV (postoperative nausea and vomiting)    and headaches and combative   Pre-diabetes    RLS (restless legs syndrome)    Seizures (HCC)    Dx 16 yrs ago - hormonal - none at least 3 yrs   Seizures (HCC)    H/O due to hormone issues    Patient Active Problem List   Diagnosis Date Noted   Chronic fatigue 02/27/2023   Hot flashes 02/27/2023   Monoclonal gammopathy 12/29/2021   Polyarthralgia 11/01/2021   Left foot pain 11/01/2021   Restless legs 03/09/2021   Prediabetes 02/26/2019   Chronic neck pain 02/26/2019   Hormone replacement therapy (HRT) 02/22/2018   Meniere disease 02/22/2018   Encounter for  annual general medical examination with abnormal findings in adult 02/11/2016   Hyperlipidemia 02/11/2016   Insomnia 01/22/2016   Depression with anxiety 04/16/2015   GERD (gastroesophageal reflux disease) 04/16/2015   Arthritis 04/16/2015   ETD (eustachian tube dysfunction) 04/16/2015    Past Surgical History:  Procedure Laterality Date   ABDOMINAL HYSTERECTOMY     CATARACT EXTRACTION Bilateral 01/2023   CHOLECYSTECTOMY  09/05/2006   dialation and cartarage  1982, 1999, 2000   DISTAL INTERPHALANGEAL JOINT FUSION Left 05/24/2022   Procedure: Left index and ring DIP fusion;  Surgeon: Kathlynn Sharper, MD;  Location: [facility];  Service: Orthopedics;  Laterality: Left;   FINGER SURGERY  2011, 2012   Bone fusion of middle finger right and left hands   FINGER SURGERY  09/06/2011   Pin removed from left middle finger   GANGLION CYST EXCISION  2011, 2012   Left and right hands   MYRINGOTOMY WITH TUBE PLACEMENT Bilateral 05/13/2015   Procedure: MYRINGOTOMY WITH  BUTTERFLY TUBE PLACEMENT;  Surgeon: Carolee Hunter, MD;  Location: [facility];  Service: ENT;  Laterality: Bilateral;   MYRINGOTOMY WITH TUBE PLACEMENT Bilateral 08/28/2019   Procedure: MYRINGOTOMY WITH BUTTERFLY TUBE PLACEMENT;  Surgeon: Hunter Carolee, MD;  Location:  [facility];  Service: ENT;  Laterality: Bilateral;   PAROTID GLAND TUMOR EXCISION  09/05/1984   TONSILLECTOMY AND ADENOIDECTOMY  09/05/1957   TUBAL LIGATION  09/05/2008    OB History   No obstetric history on file.      Home Medications    Prior to Admission medications   Medication Sig Start Date End Date Taking? Authorizing Provider  Ascorbic Acid (VITAMIN C) 1000 MG tablet Take 1,800 mg by mouth daily.    [provider]  celecoxib  (CELEBREX ) 100 MG capsule Take 100 mg by mouth 2 (two) times daily. 100 mg AM, 200 mg at dinner 12/13/21   [provider]  celecoxib  (CELEBREX ) 100 MG capsule Take up to 2  capsules (200 mg total) by mouth 2 (two) times daily as needed for pain. 05/07/24     cetirizine  (ZYRTEC ) 10 MG tablet Take 1 tablet (10 mg total) by mouth daily. Patient taking differently: Take 10 mg by mouth daily. am 07/01/19   Lavell Bari LABOR, FNP  cyanocobalamin  (VITAMIN B12) 500 MCG tablet Take 500 mcg by mouth daily.    [provider]  DULoxetine  (CYMBALTA ) 30 MG capsule TAKE ONE CAPSULE (30 MG TOTAL) BY MOUTH DAILY. FOR ANXIETY, DEPRESSION, PAIN 06/27/24   Clark, Katherine K, NP  Ergocalciferol  (VITAMIN D2) 10 MCG (400 UNIT) TABS Take 800 Units by mouth daily.    [provider]  estradiol  (ESTRACE ) 0.1 MG/GM vaginal cream APPLY TWO TO THREE TIMES WEEKLY 05/31/24   Clark, Katherine K, NP  famotidine  (PEPCID ) 20 MG tablet TAKE ONE TABLET (20 MG TOTAL) BY MOUTH DAILY FOR HEARTBURN. 06/13/24   Clark, Katherine K, NP  fluticasone  (FLONASE ) 50 MCG/ACT nasal spray PLACE ONE SPRAY INTO BOTH NOSTRILS TWO TIMES DAILY AS NEEDED FOR ALLERGIES OR RHINITIS. 12/19/23   Clark, Katherine K, NP  gabapentin  (NEURONTIN ) 600 MG tablet TAKE ONE TABLET (600 MG TOTAL) BY MOUTH AT BEDTIME. FOR RESTLESS LEGS 06/13/24   Clark, Katherine K, NP  hydrochlorothiazide  (HYDRODIURIL ) 12.5 MG tablet Take 1 tablet (12.5 mg total) by mouth daily. For meniere's disease 04/05/24   Clark, Katherine K, NP  hydrOXYzine  (ATARAX /VISTARIL ) 10 MG tablet Take 1-2 tablets by mouth twice daily as needed for long car rides. 03/09/21   Clark, Katherine K, NP  meclizine  (ANTIVERT ) 25 MG tablet Take 1 tablet (25 mg total) by mouth 3 (three) times daily as needed for dizziness (take as needed not with other antihistimines). 09/08/21   Tower, Laine LABOR, MD  omeprazole  (PRILOSEC) 20 MG capsule TAKE ONE CAPSULE BY MOUTH ONCE DAILY FOR HEARTBURN 05/31/24   Clark, Katherine K, NP  rosuvastatin  (CRESTOR ) 10 MG tablet TAKE ONE TABLET BY MOUTH ONCE A DAY FOR CHOLESTEROL 05/09/24   Clark, Katherine K, NP  tiZANidine (ZANAFLEX) 2 MG tablet Take 2 mg  by mouth 2 (two) times daily. 07/08/23   [provider]    Family History Family History  Problem Relation Age of Onset   Arthritis Mother    Hyperlipidemia Mother    Hypertension Mother    Stroke Mother    Heart disease Mother    Diabetes Mother    Hyperlipidemia Father    Heart disease Father    Arthritis Brother    Throat cancer Maternal Grandfather    Breast cancer Neg Hx     Social History Social History   Tobacco Use   Smoking status: Never   Smokeless tobacco: Never  Vaping Use   Vaping status: Never Used  Substance Use Topics   Alcohol use: No    Alcohol/week: 0.0 standard drinks of alcohol   Drug use: No     Allergies   Septra [sulfamethoxazole-trimethoprim]   Review of Systems Review of Systems  Constitutional: Negative.   HENT:  Positive for congestion, ear pain and sneezing. Negative for dental problem, drooling, ear discharge, facial swelling, hearing loss, mouth sores, nosebleeds, postnasal drip, rhinorrhea, sinus pressure, sinus pain, sore throat, tinnitus, trouble swallowing and voice change.   Respiratory:  Positive for cough. Negative for apnea, choking, chest tightness, shortness of breath, wheezing and stridor.   Cardiovascular: Negative.   Gastrointestinal: Negative.      Physical Exam Triage Vital Signs ED Triage Vitals  Encounter Vitals Group     BP 07/13/24 1246 (!) 113/58     Girls Systolic BP Percentile --      Girls Diastolic BP Percentile --      Boys Systolic BP Percentile --      Boys Diastolic BP Percentile --      Pulse Rate 07/13/24 1246 97     Resp 07/13/24 1246 17     Temp 07/13/24 1246 98.3 F (36.8 C)     Temp Source 07/13/24 1246 Oral     SpO2 07/13/24 1246 96 %     Weight --      Height --      Head Circumference --      Peak Flow --      Pain Score 07/13/24 1244 8     Pain Loc --      Pain Education --      Exclude from Growth Chart --    No data found.  Updated Vital Signs BP (!) 113/58 (BP  Location: Left Arm)   Pulse 97   Temp 98.3 F (36.8 C) (Oral)   Resp 17   SpO2 96%   Visual Acuity Right Eye Distance:   Left Eye Distance:   Bilateral Distance:    Right Eye Near:   Left Eye Near:    Bilateral Near:     Physical Exam Constitutional:      Appearance: Normal appearance.  HENT:     Right Ear: Tympanic membrane, ear canal and external ear normal.     Left Ear: Tympanic membrane is erythematous.  Eyes:     Extraocular Movements: Extraocular movements intact.  Pulmonary:     Effort: Pulmonary effort is normal.  Neurological:     Mental Status: She is alert and oriented to person, place, and time.      UC Treatments / Results  Labs (all labs ordered are listed, but only abnormal results are displayed) Labs Reviewed - No data to display  EKG   Radiology No results found.  Procedures Procedures (including critical care time)  Medications Ordered in UC Medications - No data to display  Initial Impression / Assessment and Plan / UC Course  I have reviewed the triage vital signs and the nursing notes.  Pertinent labs & imaging results that were available during my care of the patient were reviewed by me and considered in my medical decision making (see chart for details).  Nonrecurrent acute serous otitis media of the left ear  Erythema present to the left tympanic membrane concerning for infection, tube in place, discussed findings with patient, prescribed Augmentin and discussed administration recommended over-the-counter medications and nonpharmacological supportive care and advised follow-up with urgent care if symptoms continue to persist or worsen Final Clinical Impressions(s) /  UC Diagnoses   Final diagnoses:  None   Discharge Instructions   None    ED Prescriptions   None    PDMP not reviewed this encounter.   Ronda Shelba SAUNDERS, NP 07/13/24 1258

## 2024-07-13 NOTE — Discharge Instructions (Signed)
 Today you are being treated for an infection of the eardrum on left side  Take Augmentin twice a day for 7 days, you should begin to see improvement after 48 hours of medication use and then it should progressively get better  You may use Tylenol  or ibuprofen for management of discomfort  May hold warm compresses to the ear for additional comfort  Please not attempted any ear cleaning or object or fluid placement into the ear canal to prevent further irritation

## 2024-08-07 ENCOUNTER — Other Ambulatory Visit: Payer: Self-pay

## 2024-08-08 ENCOUNTER — Other Ambulatory Visit: Payer: Self-pay | Admitting: Primary Care

## 2024-08-08 DIAGNOSIS — N951 Menopausal and female climacteric states: Secondary | ICD-10-CM

## 2024-08-13 ENCOUNTER — Other Ambulatory Visit: Payer: Self-pay | Admitting: Primary Care

## 2024-08-13 DIAGNOSIS — J3089 Other allergic rhinitis: Secondary | ICD-10-CM

## 2024-09-02 ENCOUNTER — Other Ambulatory Visit: Payer: Self-pay

## 2024-09-08 ENCOUNTER — Other Ambulatory Visit: Payer: Self-pay

## 2024-09-20 ENCOUNTER — Other Ambulatory Visit (HOSPITAL_COMMUNITY): Payer: Self-pay

## 2024-09-20 ENCOUNTER — Other Ambulatory Visit: Payer: Self-pay

## 2024-09-20 MED ORDER — CELECOXIB 100 MG PO CAPS: 100.0000 mg | ORAL_CAPSULE | Freq: Two times a day (BID) | ORAL | 1 refills | Status: AC | PRN

## 2024-09-20 MED ORDER — TIZANIDINE HCL 2 MG PO TABS
2.0000 mg | ORAL_TABLET | Freq: Two times a day (BID) | ORAL | 5 refills | Status: AC | PRN
  Filled 2024-09-20: qty 60, 30d supply, fill #0

## 2024-09-20 MED FILL — Fluticasone Propionate Nasal Susp 50 MCG/ACT: NASAL | 90 days supply | Qty: 32 | Fill #0 | Status: AC

## 2024-09-20 MED FILL — Duloxetine HCl Enteric Coated Pellets Cap 30 MG (Base Eq): ORAL | 90 days supply | Qty: 90 | Fill #0 | Status: CN

## 2024-09-20 MED FILL — Gabapentin Tab 600 MG: ORAL | 90 days supply | Qty: 90 | Fill #0 | Status: AC

## 2024-09-20 MED FILL — Duloxetine HCl Enteric Coated Pellets Cap 30 MG (Base Eq): ORAL | 30 days supply | Qty: 30 | Fill #0 | Status: AC

## 2024-09-21 ENCOUNTER — Other Ambulatory Visit: Payer: Self-pay

## 2025-02-03 ENCOUNTER — Inpatient Hospital Stay

## 2025-02-12 ENCOUNTER — Ambulatory Visit

## 2025-02-17 ENCOUNTER — Ambulatory Visit: Admitting: Internal Medicine
